# Patient Record
Sex: Male | Born: 1955 | Race: White | Hispanic: No | Marital: Married | Smoking: Current every day smoker
Health system: Southern US, Community
[De-identification: ages and names within clinical notes are randomized; demographics above are authoritative.]

## PROBLEM LIST (undated history)

## (undated) DIAGNOSIS — I10 Essential (primary) hypertension: Secondary | ICD-10-CM

## (undated) DIAGNOSIS — I714 Abdominal aortic aneurysm, without rupture, unspecified: Secondary | ICD-10-CM

## (undated) DIAGNOSIS — K7030 Alcoholic cirrhosis of liver without ascites: Secondary | ICD-10-CM

## (undated) DIAGNOSIS — M549 Dorsalgia, unspecified: Secondary | ICD-10-CM

## (undated) DIAGNOSIS — G8929 Other chronic pain: Secondary | ICD-10-CM

## (undated) DIAGNOSIS — J449 Chronic obstructive pulmonary disease, unspecified: Secondary | ICD-10-CM

## (undated) DIAGNOSIS — K219 Gastro-esophageal reflux disease without esophagitis: Secondary | ICD-10-CM

## (undated) DIAGNOSIS — N179 Acute kidney failure, unspecified: Secondary | ICD-10-CM

## (undated) HISTORY — DX: Gastro-esophageal reflux disease without esophagitis: K21.9

## (undated) HISTORY — DX: Alcoholic cirrhosis of liver without ascites: K70.30

---

## 2015-03-07 ENCOUNTER — Inpatient Hospital Stay (HOSPITAL_COMMUNITY): Payer: Medicaid Other

## 2015-03-07 ENCOUNTER — Emergency Department (HOSPITAL_COMMUNITY): Payer: Medicaid Other

## 2015-03-07 ENCOUNTER — Inpatient Hospital Stay (HOSPITAL_COMMUNITY)
Admission: EM | Admit: 2015-03-07 | Discharge: 2015-03-24 | DRG: 207 | Disposition: A | Discharge status: Home / Self Care | Payer: Medicaid Other | Attending: General Surgery | Admitting: General Surgery

## 2015-03-07 ENCOUNTER — Encounter (HOSPITAL_COMMUNITY): Payer: Self-pay | Admitting: Radiology

## 2015-03-07 DIAGNOSIS — N4889 Other specified disorders of penis: Secondary | ICD-10-CM | POA: Diagnosis present

## 2015-03-07 DIAGNOSIS — S2243XA Multiple fractures of ribs, bilateral, initial encounter for closed fracture: Secondary | ICD-10-CM | POA: Diagnosis present

## 2015-03-07 DIAGNOSIS — S272XXA Traumatic hemopneumothorax, initial encounter: Principal | ICD-10-CM | POA: Diagnosis present

## 2015-03-07 DIAGNOSIS — K7460 Unspecified cirrhosis of liver: Secondary | ICD-10-CM | POA: Diagnosis present

## 2015-03-07 DIAGNOSIS — R000 Tachycardia, unspecified: Secondary | ICD-10-CM | POA: Diagnosis present

## 2015-03-07 DIAGNOSIS — T797XXA Traumatic subcutaneous emphysema, initial encounter: Secondary | ICD-10-CM | POA: Diagnosis present

## 2015-03-07 DIAGNOSIS — R339 Retention of urine, unspecified: Secondary | ICD-10-CM | POA: Diagnosis not present

## 2015-03-07 DIAGNOSIS — R161 Splenomegaly, not elsewhere classified: Secondary | ICD-10-CM | POA: Diagnosis present

## 2015-03-07 DIAGNOSIS — E870 Hyperosmolality and hypernatremia: Secondary | ICD-10-CM | POA: Diagnosis not present

## 2015-03-07 DIAGNOSIS — D62 Acute posthemorrhagic anemia: Secondary | ICD-10-CM | POA: Diagnosis not present

## 2015-03-07 DIAGNOSIS — R079 Chest pain, unspecified: Secondary | ICD-10-CM | POA: Diagnosis present

## 2015-03-07 DIAGNOSIS — N5089 Other specified disorders of the male genital organs: Secondary | ICD-10-CM | POA: Diagnosis present

## 2015-03-07 DIAGNOSIS — J90 Pleural effusion, not elsewhere classified: Secondary | ICD-10-CM | POA: Diagnosis present

## 2015-03-07 DIAGNOSIS — R918 Other nonspecific abnormal finding of lung field: Secondary | ICD-10-CM | POA: Diagnosis present

## 2015-03-07 DIAGNOSIS — R188 Other ascites: Secondary | ICD-10-CM | POA: Diagnosis present

## 2015-03-07 DIAGNOSIS — E279 Disorder of adrenal gland, unspecified: Secondary | ICD-10-CM | POA: Diagnosis present

## 2015-03-07 DIAGNOSIS — S272XXS Traumatic hemopneumothorax, sequela: Secondary | ICD-10-CM | POA: Diagnosis not present

## 2015-03-07 DIAGNOSIS — F1190 Opioid use, unspecified, uncomplicated: Secondary | ICD-10-CM | POA: Diagnosis present

## 2015-03-07 DIAGNOSIS — M549 Dorsalgia, unspecified: Secondary | ICD-10-CM | POA: Diagnosis present

## 2015-03-07 DIAGNOSIS — K766 Portal hypertension: Secondary | ICD-10-CM | POA: Diagnosis present

## 2015-03-07 DIAGNOSIS — W19XXXA Unspecified fall, initial encounter: Secondary | ICD-10-CM | POA: Diagnosis present

## 2015-03-07 DIAGNOSIS — K439 Ventral hernia without obstruction or gangrene: Secondary | ICD-10-CM | POA: Diagnosis present

## 2015-03-07 DIAGNOSIS — J95821 Acute postprocedural respiratory failure: Secondary | ICD-10-CM | POA: Diagnosis not present

## 2015-03-07 DIAGNOSIS — J939 Pneumothorax, unspecified: Secondary | ICD-10-CM

## 2015-03-07 DIAGNOSIS — J440 Chronic obstructive pulmonary disease with acute lower respiratory infection: Secondary | ICD-10-CM | POA: Diagnosis present

## 2015-03-07 DIAGNOSIS — E162 Hypoglycemia, unspecified: Secondary | ICD-10-CM | POA: Diagnosis not present

## 2015-03-07 DIAGNOSIS — L8990 Pressure ulcer of unspecified site, unspecified stage: Secondary | ICD-10-CM | POA: Insufficient documentation

## 2015-03-07 DIAGNOSIS — J982 Interstitial emphysema: Secondary | ICD-10-CM

## 2015-03-07 DIAGNOSIS — E876 Hypokalemia: Secondary | ICD-10-CM | POA: Diagnosis present

## 2015-03-07 DIAGNOSIS — Y93K1 Activity, walking an animal: Secondary | ICD-10-CM

## 2015-03-07 DIAGNOSIS — G8929 Other chronic pain: Secondary | ICD-10-CM | POA: Diagnosis present

## 2015-03-07 DIAGNOSIS — W000XXA Fall on same level due to ice and snow, initial encounter: Secondary | ICD-10-CM | POA: Diagnosis present

## 2015-03-07 DIAGNOSIS — I10 Essential (primary) hypertension: Secondary | ICD-10-CM | POA: Diagnosis present

## 2015-03-07 DIAGNOSIS — J189 Pneumonia, unspecified organism: Secondary | ICD-10-CM | POA: Diagnosis not present

## 2015-03-07 DIAGNOSIS — F17200 Nicotine dependence, unspecified, uncomplicated: Secondary | ICD-10-CM | POA: Diagnosis present

## 2015-03-07 DIAGNOSIS — W19XXXS Unspecified fall, sequela: Secondary | ICD-10-CM | POA: Diagnosis not present

## 2015-03-07 DIAGNOSIS — R160 Hepatomegaly, not elsewhere classified: Secondary | ICD-10-CM | POA: Diagnosis present

## 2015-03-07 DIAGNOSIS — Z4659 Encounter for fitting and adjustment of other gastrointestinal appliance and device: Secondary | ICD-10-CM

## 2015-03-07 DIAGNOSIS — R40243 Glasgow coma scale score 3-8, unspecified time: Secondary | ICD-10-CM | POA: Diagnosis not present

## 2015-03-07 DIAGNOSIS — J9690 Respiratory failure, unspecified, unspecified whether with hypoxia or hypercapnia: Secondary | ICD-10-CM

## 2015-03-07 DIAGNOSIS — Z978 Presence of other specified devices: Secondary | ICD-10-CM

## 2015-03-07 DIAGNOSIS — R0902 Hypoxemia: Secondary | ICD-10-CM

## 2015-03-07 DIAGNOSIS — J9601 Acute respiratory failure with hypoxia: Secondary | ICD-10-CM | POA: Diagnosis not present

## 2015-03-07 DIAGNOSIS — J13 Pneumonia due to Streptococcus pneumoniae: Secondary | ICD-10-CM | POA: Diagnosis not present

## 2015-03-07 DIAGNOSIS — R002 Palpitations: Secondary | ICD-10-CM | POA: Diagnosis present

## 2015-03-07 DIAGNOSIS — D696 Thrombocytopenia, unspecified: Secondary | ICD-10-CM | POA: Diagnosis present

## 2015-03-07 DIAGNOSIS — I491 Atrial premature depolarization: Secondary | ICD-10-CM | POA: Diagnosis not present

## 2015-03-07 DIAGNOSIS — I714 Abdominal aortic aneurysm, without rupture, unspecified: Secondary | ICD-10-CM | POA: Diagnosis present

## 2015-03-07 DIAGNOSIS — S2242XS Multiple fractures of ribs, left side, sequela: Secondary | ICD-10-CM | POA: Diagnosis not present

## 2015-03-07 DIAGNOSIS — G9340 Encephalopathy, unspecified: Secondary | ICD-10-CM | POA: Diagnosis not present

## 2015-03-07 DIAGNOSIS — S2249XS Multiple fractures of ribs, unspecified side, sequela: Secondary | ICD-10-CM | POA: Diagnosis not present

## 2015-03-07 DIAGNOSIS — J9600 Acute respiratory failure, unspecified whether with hypoxia or hypercapnia: Secondary | ICD-10-CM

## 2015-03-07 DIAGNOSIS — S2249XA Multiple fractures of ribs, unspecified side, initial encounter for closed fracture: Secondary | ICD-10-CM | POA: Diagnosis present

## 2015-03-07 DIAGNOSIS — S2242XA Multiple fractures of ribs, left side, initial encounter for closed fracture: Secondary | ICD-10-CM

## 2015-03-07 DIAGNOSIS — R1310 Dysphagia, unspecified: Secondary | ICD-10-CM | POA: Diagnosis present

## 2015-03-07 DIAGNOSIS — E278 Other specified disorders of adrenal gland: Secondary | ICD-10-CM | POA: Diagnosis present

## 2015-03-07 DIAGNOSIS — R0602 Shortness of breath: Secondary | ICD-10-CM

## 2015-03-07 HISTORY — DX: Dorsalgia, unspecified: M54.9

## 2015-03-07 HISTORY — DX: Other chronic pain: G89.29

## 2015-03-07 HISTORY — DX: Essential (primary) hypertension: I10

## 2015-03-07 LAB — RAPID URINE DRUG SCREEN, HOSP PERFORMED
AMPHETAMINES: NOT DETECTED
BENZODIAZEPINES: NOT DETECTED
Barbiturates: NOT DETECTED
Cocaine: NOT DETECTED
Opiates: POSITIVE — AB
TETRAHYDROCANNABINOL: NOT DETECTED

## 2015-03-07 LAB — COMPREHENSIVE METABOLIC PANEL
ALBUMIN: 2.4 g/dL — AB (ref 3.5–5.0)
ALT: 30 U/L (ref 17–63)
ALT: 28 U/L (ref 17–63)
AST: 69 U/L — AB (ref 15–41)
AST: 67 U/L — AB (ref 15–41)
Albumin: 2.2 g/dL — ABNORMAL LOW (ref 3.5–5.0)
Alkaline Phosphatase: 163 U/L — ABNORMAL HIGH (ref 38–126)
Alkaline Phosphatase: 156 U/L — ABNORMAL HIGH (ref 38–126)
Anion gap: 9 (ref 5–15)
Anion gap: 8 (ref 5–15)
BUN: 14 mg/dL (ref 6–20)
BUN: 12 mg/dL (ref 6–20)
CHLORIDE: 105 mmol/L (ref 101–111)
CO2: 27 mmol/L (ref 22–32)
CO2: 23 mmol/L (ref 22–32)
CREATININE: 1.03 mg/dL (ref 0.61–1.24)
Calcium: 8.1 mg/dL — ABNORMAL LOW (ref 8.9–10.3)
Calcium: 7.6 mg/dL — ABNORMAL LOW (ref 8.9–10.3)
Chloride: 102 mmol/L (ref 101–111)
Creatinine, Ser: 1.11 mg/dL (ref 0.61–1.24)
GFR calc Af Amer: >60 mL/min (ref >60)
GFR calc Af Amer: >60 mL/min (ref >60)
GFR calc non Af Amer: >60 mL/min (ref >60)
GFR, EST NON AFRICAN AMERICAN: 54 mL/min — AB (ref >60)
GLUCOSE: 138 mg/dL — AB (ref 65–99)
GLUCOSE: 117 mg/dL — AB (ref 65–99)
POTASSIUM: 3.3 mmol/L — AB (ref 3.5–5.1)
POTASSIUM: 4 mmol/L (ref 3.5–5.1)
SODIUM: 138 mmol/L (ref 135–145)
Sodium: 136 mmol/L (ref 135–145)
TOTAL PROTEIN: 6.4 g/dL — AB (ref 6.5–8.1)
Total Bilirubin: 0.7 mg/dL (ref 0.3–1.2)
Total Bilirubin: 0.8 mg/dL (ref 0.3–1.2)
Total Protein: 6.2 g/dL — ABNORMAL LOW (ref 6.5–8.1)

## 2015-03-07 LAB — CBC
HEMATOCRIT: 37.4 % — AB (ref 39.0–52.0)
Hemoglobin: 12.4 g/dL — ABNORMAL LOW (ref 13.0–17.0)
MCH: 30.8 pg (ref 26.0–34.0)
MCHC: 33.2 g/dL (ref 30.0–36.0)
MCV: 93 fL (ref 78.0–100.0)
PLATELETS: 119 10*3/uL — AB (ref 150–400)
RBC: 4.02 MIL/uL — AB (ref 4.22–5.81)
RDW: 16 % — ABNORMAL HIGH (ref 11.5–15.5)
WBC: 5.5 10*3/uL (ref 4.0–10.5)

## 2015-03-07 LAB — URINALYSIS, ROUTINE W REFLEX MICROSCOPIC
Bilirubin Urine: NEGATIVE
Glucose, UA: NEGATIVE mg/dL
HGB URINE DIPSTICK: NEGATIVE
Ketones, ur: NEGATIVE mg/dL
LEUKOCYTES UA: NEGATIVE
Nitrite: NEGATIVE
PH: 5.5 (ref 5.0–8.0)
Protein, ur: NEGATIVE mg/dL
SPECIFIC GRAVITY, URINE: 1.042 — AB (ref 1.005–1.030)

## 2015-03-07 LAB — CBC WITH DIFFERENTIAL/PLATELET
BASOS ABS: 0 10*3/uL (ref 0.0–0.1)
BASOS PCT: 0 %
EOS ABS: 0.3 10*3/uL (ref 0.0–0.7)
EOS PCT: 4 %
HCT: 38.9 % — ABNORMAL LOW (ref 39.0–52.0)
Hemoglobin: 12.7 g/dL — ABNORMAL LOW (ref 13.0–17.0)
LYMPHS PCT: 43 %
Lymphs Abs: 3.1 10*3/uL (ref 0.7–4.0)
MCH: 30.6 pg (ref 26.0–34.0)
MCHC: 32.6 g/dL (ref 30.0–36.0)
MCV: 93.7 fL (ref 78.0–100.0)
MONO ABS: 0.7 10*3/uL (ref 0.1–1.0)
Monocytes Relative: 9 %
Neutro Abs: 3.2 10*3/uL (ref 1.7–7.7)
Neutrophils Relative %: 44 %
PLATELETS: 136 10*3/uL — AB (ref 150–400)
RBC: 4.15 MIL/uL — AB (ref 4.22–5.81)
RDW: 15.9 % — AB (ref 11.5–15.5)
WBC: 7.3 10*3/uL (ref 4.0–10.5)

## 2015-03-07 LAB — PREPARE FRESH FROZEN PLASMA
UNIT DIVISION: 0
UNIT DIVISION: 0

## 2015-03-07 LAB — ABO/RH: ABO/RH(D): O POS

## 2015-03-07 LAB — I-STAT CHEM 8, ED
BUN: 16 mg/dL (ref 6–20)
CHLORIDE: 100 mmol/L — AB (ref 101–111)
CREATININE: 1 mg/dL (ref 0.61–1.24)
Calcium, Ion: 1.08 mmol/L — ABNORMAL LOW (ref 1.12–1.23)
GLUCOSE: 131 mg/dL — AB (ref 65–99)
HCT: 43 % (ref 39.0–52.0)
Hemoglobin: 14.6 g/dL (ref 13.0–17.0)
POTASSIUM: 3.2 mmol/L — AB (ref 3.5–5.1)
Sodium: 141 mmol/L (ref 135–145)
TCO2: 25 mmol/L (ref 0–100)

## 2015-03-07 LAB — ETHANOL

## 2015-03-07 LAB — BLOOD PRODUCT ORDER (VERBAL) VERIFICATION

## 2015-03-07 LAB — I-STAT CG4 LACTIC ACID, ED: LACTIC ACID, VENOUS: 2.06 mmol/L — AB (ref 0.5–2.0)

## 2015-03-07 LAB — MRSA PCR SCREENING: MRSA by PCR: POSITIVE — AB

## 2015-03-07 LAB — LIPASE, BLOOD: Lipase: 39 U/L (ref 11–51)

## 2015-03-07 LAB — I-STAT TROPONIN, ED: TROPONIN I, POC: 0 ng/mL (ref 0.00–0.08)

## 2015-03-07 MED ORDER — NICOTINE 21 MG/24HR TD PT24
21.0000 mg | MEDICATED_PATCH | Freq: Every day | TRANSDERMAL | Status: DC
  Administered 2015-03-07 – 2015-03-11 (×5): 21 mg via TRANSDERMAL
  Filled 2015-03-07 (×5): qty 1

## 2015-03-07 MED ORDER — KETOROLAC TROMETHAMINE 15 MG/ML IJ SOLN
15.0000 mg | Freq: Four times a day (QID) | INTRAMUSCULAR | Status: DC | PRN
  Administered 2015-03-07 – 2015-03-09 (×5): 15 mg via INTRAVENOUS
  Filled 2015-03-07 (×5): qty 1

## 2015-03-07 MED ORDER — HYDROMORPHONE HCL 1 MG/ML IJ SOLN
1.0000 mg | Freq: Once | INTRAMUSCULAR | Status: AC
  Administered 2015-03-07: 1 mg via INTRAVENOUS

## 2015-03-07 MED ORDER — ALBUTEROL SULFATE (2.5 MG/3ML) 0.083% IN NEBU
2.5000 mg | INHALATION_SOLUTION | Freq: Four times a day (QID) | RESPIRATORY_TRACT | Status: DC
  Administered 2015-03-07 – 2015-03-10 (×12): 2.5 mg via RESPIRATORY_TRACT
  Filled 2015-03-07 (×13): qty 3

## 2015-03-07 MED ORDER — SODIUM CHLORIDE 0.9 % IV BOLUS (SEPSIS)
1000.0000 mL | Freq: Once | INTRAVENOUS | Status: AC
  Administered 2015-03-07: 1000 mL via INTRAVENOUS

## 2015-03-07 MED ORDER — ENOXAPARIN SODIUM 40 MG/0.4ML ~~LOC~~ SOLN
40.0000 mg | SUBCUTANEOUS | Status: DC
  Administered 2015-03-07 – 2015-03-11 (×5): 40 mg via SUBCUTANEOUS
  Filled 2015-03-07 (×5): qty 0.4

## 2015-03-07 MED ORDER — AMLODIPINE BESYLATE 10 MG PO TABS
10.0000 mg | ORAL_TABLET | Freq: Every day | ORAL | Status: DC
  Administered 2015-03-07 – 2015-03-09 (×3): 10 mg via ORAL
  Filled 2015-03-07 (×3): qty 1

## 2015-03-07 MED ORDER — ONDANSETRON HCL 4 MG/2ML IJ SOLN
4.0000 mg | Freq: Four times a day (QID) | INTRAMUSCULAR | Status: DC | PRN
  Administered 2015-03-07 – 2015-03-18 (×2): 4 mg via INTRAVENOUS
  Filled 2015-03-07 (×2): qty 2

## 2015-03-07 MED ORDER — IOHEXOL 300 MG/ML  SOLN
100.0000 mL | Freq: Once | INTRAMUSCULAR | Status: AC | PRN
  Administered 2015-03-07: 100 mL via INTRAVENOUS

## 2015-03-07 MED ORDER — PNEUMOCOCCAL VAC POLYVALENT 25 MCG/0.5ML IJ INJ
0.5000 mL | INJECTION | INTRAMUSCULAR | Status: AC
  Administered 2015-03-08: 0.5 mL via INTRAMUSCULAR
  Filled 2015-03-07: qty 0.5

## 2015-03-07 MED ORDER — HYDROMORPHONE HCL 1 MG/ML IJ SOLN
INTRAMUSCULAR | Status: AC
  Filled 2015-03-07: qty 1

## 2015-03-07 MED ORDER — POTASSIUM CHLORIDE 20 MEQ PO PACK
20.0000 meq | PACK | Freq: Every day | ORAL | Status: DC
  Administered 2015-03-07 – 2015-03-08 (×2): 20 meq via ORAL
  Filled 2015-03-07 (×3): qty 1

## 2015-03-07 MED ORDER — HYDROMORPHONE HCL 1 MG/ML IJ SOLN
1.0000 mg | INTRAMUSCULAR | Status: DC | PRN
  Administered 2015-03-07 – 2015-03-09 (×17): 1 mg via INTRAVENOUS
  Filled 2015-03-07 (×17): qty 1

## 2015-03-07 MED ORDER — ONDANSETRON HCL 4 MG PO TABS: 4.0000 mg | ORAL_TABLET | Freq: Four times a day (QID) | ORAL | Status: DC | PRN

## 2015-03-07 MED ORDER — BISACODYL 10 MG RE SUPP: 10.0000 mg | Freq: Every day | RECTAL | Status: DC | PRN

## 2015-03-07 MED ORDER — CETYLPYRIDINIUM CHLORIDE 0.05 % MT LIQD
7.0000 mL | Freq: Two times a day (BID) | OROMUCOSAL | Status: DC
  Administered 2015-03-07 – 2015-03-09 (×4): 7 mL via OROMUCOSAL

## 2015-03-07 MED ORDER — CHLORHEXIDINE GLUCONATE CLOTH 2 % EX PADS
6.0000 | MEDICATED_PAD | Freq: Every day | CUTANEOUS | Status: AC
  Administered 2015-03-07 – 2015-03-11 (×5): 6 via TOPICAL

## 2015-03-07 MED ORDER — SENNOSIDES-DOCUSATE SODIUM 8.6-50 MG PO TABS
1.0000 | ORAL_TABLET | Freq: Two times a day (BID) | ORAL | Status: DC
  Administered 2015-03-07 – 2015-03-24 (×27): 1 via ORAL
  Filled 2015-03-07 (×28): qty 1

## 2015-03-07 MED ORDER — KCL IN DEXTROSE-NACL 20-5-0.9 MEQ/L-%-% IV SOLN
INTRAVENOUS | Status: DC
  Administered 2015-03-07 (×2): via INTRAVENOUS
  Administered 2015-03-09: 1000 mL via INTRAVENOUS
  Filled 2015-03-07 (×5): qty 1000

## 2015-03-07 MED ORDER — INFLUENZA VAC SPLIT QUAD 0.5 ML IM SUSY
0.5000 mL | PREFILLED_SYRINGE | INTRAMUSCULAR | Status: AC
  Administered 2015-03-08: 0.5 mL via INTRAMUSCULAR

## 2015-03-07 MED ORDER — MUPIROCIN 2 % EX OINT
1.0000 "application " | TOPICAL_OINTMENT | Freq: Two times a day (BID) | CUTANEOUS | Status: AC
  Administered 2015-03-07 – 2015-03-11 (×10): 1 via NASAL
  Filled 2015-03-07 (×4): qty 22

## 2015-03-07 MED ORDER — METHADONE HCL 10 MG/5ML PO SOLN: 71.0000 mg | Freq: Every day | ORAL | Status: DC

## 2015-03-07 MED ORDER — METHADONE HCL 10 MG PO TABS
70.0000 mg | ORAL_TABLET | Freq: Every day | ORAL | Status: DC
  Administered 2015-03-07 – 2015-03-09 (×3): 70 mg via ORAL
  Filled 2015-03-07 (×3): qty 7

## 2015-03-07 MED ORDER — HYDROMORPHONE HCL 1 MG/ML IJ SOLN
1.0000 mg | Freq: Once | INTRAMUSCULAR | Status: AC
  Administered 2015-03-07: 1 mg via INTRAVENOUS
  Filled 2015-03-07: qty 1

## 2015-03-07 MED ORDER — POTASSIUM CHLORIDE 10 MEQ/100ML IV SOLN
10.0000 meq | INTRAVENOUS | Status: AC
  Administered 2015-03-07 (×2): 10 meq via INTRAVENOUS
  Filled 2015-03-07 (×2): qty 100

## 2015-03-07 MED ORDER — OXYCODONE HCL 5 MG PO TABS
10.0000 mg | ORAL_TABLET | ORAL | Status: DC | PRN
  Administered 2015-03-07 – 2015-03-08 (×7): 10 mg via ORAL
  Filled 2015-03-07 (×7): qty 2

## 2015-03-07 NOTE — Progress Notes (Signed)
Dr. Donell BeersByerly informed of chest x-ray results. Stated to proceed with Bi-pap at this time, and no chest tube placement. Patient's oxygen saturation 91-94% on Venti mask at this time.

## 2015-03-07 NOTE — ED Notes (Signed)
The pt arrived by gems from home the pt went out to walk his dog slipped on the ice on the ground and fell to the pavement  Striking his lt chest on the pavement.  C/o much pain in his lt chest and his neck  c-collar.

## 2015-03-07 NOTE — ED Notes (Signed)
Clothes shirt sweat shirt jacket pants shoes socks removed bagged up.  No valuables

## 2015-03-07 NOTE — ED Notes (Signed)
To xray then to 2658m

## 2015-03-07 NOTE — ED Notes (Signed)
To ct

## 2015-03-07 NOTE — ED Notes (Signed)
Report called to rn on 3261m.  Pt need flesion and extension of his c-spine will take him up after this

## 2015-03-07 NOTE — ED Notes (Signed)
Pt returned from ct

## 2015-03-07 NOTE — ED Notes (Signed)
Nasal o02 at 3 liters

## 2015-03-07 NOTE — Progress Notes (Signed)
Follow up - Trauma and Critical Care  Patient Details:    Benjamin Bell is an 60 y.o. male.  Lines/tubes :   Microbiology/Sepsis markers: No results found for this or any previous visit.  Anti-infectives:  Anti-infectives    None      Best Practice/Protocols:  VTE Prophylaxis: Lovenox (prophylaxtic dose) none.  Consults:      Events:  Subjective:    Overnight Issues: Transferred up to icu  Objective:  Vital signs for last 24 hours: Temp:  [97.4 F (36.3 C)-98.3 F (36.8 C)] 98.3 F (36.8 C) (01/08 0737) Pulse Rate:  [86-111] 111 (01/08 0800) Resp:  [16-24] 16 (01/08 0800) BP: (84-131)/(57-79) 120/77 mmHg (01/08 0800) SpO2:  [88 %-94 %] 93 % (01/08 0800) Weight:  [68.04 kg (150 lb)] 68.04 kg (150 lb) (01/08 0446)  Hemodynamic parameters for last 24 hours:    Intake/Output from previous day: 01/07 0701 - 01/08 0700 In: 1900 [I.V.:1900] Out: 0   Intake/Output this shift: Total I/O In: 118.8 [I.V.:118.8] Out: 390 [Urine:390]  Vent settings for last 24 hours:    Physical Exam:  General: alert and no respiratory distress Neuro: alert, oriented and nonfocal exam Resp: breathing comfortably CVS: RR&R GI: s, NT, ND Abd with reducible hernias and incisions.  Results for orders placed or performed during the hospital encounter of 03/07/15 (from the past 24 hour(s))  Type and screen     Status: None   Collection Time: 03/07/15  3:17 AM  Result Value Ref Range   ABO/RH(D) O POS    Antibody Screen NEG    Sample Expiration 03/10/2015    Unit Number [id number]    Blood Component Type RED CELLS,LR    Unit division 00    Status of Unit REL FROM [facility]    Unit tag comment VERBAL ORDERS PER DR ONI    Transfusion Status OK TO TRANSFUSE    Crossmatch Result COMPATIBLE    Unit Number [id number]    Blood Component Type RED CELLS,LR    Unit division 00    Status of Unit REL FROM [facility]    Unit tag comment VERBAL ORDERS PER DR ONI    Transfusion Status  OK TO TRANSFUSE    Crossmatch Result COMPATIBLE   Prepare fresh frozen plasma     Status: None   Collection Time: 03/07/15  3:17 AM  Result Value Ref Range   Unit Number [id number]    Blood Component Type THAWED PLASMA    Unit division 00    Status of Unit REL FROM [facility]    Unit tag comment VERBAL ORDERS PER DR ONI    Transfusion Status OK TO TRANSFUSE    Unit Number [id number]    Blood Component Type THAWED PLASMA    Unit division 00    Status of Unit REL FROM [facility]    Unit tag comment VERBAL ORDERS PER DR ONI    Transfusion Status OK TO TRANSFUSE   ABO/Rh     Status: None   Collection Time: 03/07/15  3:17 AM  Result Value Ref Range   ABO/RH(D) O POS   CBC with Differential/Platelet     Status: Abnormal   Collection Time: 03/07/15  3:21 AM  Result Value Ref Range   WBC 7.3 4.0 - 10.5 K/uL   RBC 4.15 (L) 4.22 - 5.81 MIL/uL   Hemoglobin 12.7 (L) 13.0 - 17.0 g/dL   HCT 72.5 (L) 36.6 - 44.0 %   MCV 93.7 78.0 - 100.0 fL  MCH 30.6 26.0 - 34.0 pg   MCHC 32.6 30.0 - 36.0 g/dL   RDW 16.115.9 (H) 09.611.5 - 04.515.5 %   Platelets 136 (L) 150 - 400 K/uL   Neutrophils Relative % 44 %   Neutro Abs 3.2 1.7 - 7.7 K/uL   Lymphocytes Relative 43 %   Lymphs Abs 3.1 0.7 - 4.0 K/uL   Monocytes Relative 9 %   Monocytes Absolute 0.7 0.1 - 1.0 K/uL   Eosinophils Relative 4 %   Eosinophils Absolute 0.3 0.0 - 0.7 K/uL   Basophils Relative 0 %   Basophils Absolute 0.0 0.0 - 0.1 K/uL  Comprehensive metabolic panel     Status: Abnormal   Collection Time: 03/07/15  3:21 AM  Result Value Ref Range   Sodium 138 135 - 145 mmol/L   Potassium 3.3 (L) 3.5 - 5.1 mmol/L   Chloride 102 101 - 111 mmol/L   CO2 27 22 - 32 mmol/L   Glucose, Bld 138 (H) 65 - 99 mg/dL   BUN 14 6 - 20 mg/dL   Creatinine, Ser 4.091.11 0.61 - 1.24 mg/dL   Calcium 8.1 (L) 8.9 - 10.3 mg/dL   Total Protein 6.4 (L) 6.5 - 8.1 g/dL   Albumin 2.4 (L) 3.5 - 5.0 g/dL   AST 69 (H) 15 - 41 U/L   ALT 30 17 - 63 U/L   Alkaline Phosphatase 163  (H) 38 - 126 U/L   Total Bilirubin 0.7 0.3 - 1.2 mg/dL   GFR calc non Af Amer 54 (L) >60 mL/min   GFR calc Af Amer >60 >60 mL/min   Anion gap 9 5 - 15  Lipase, blood     Status: None   Collection Time: 03/07/15  3:21 AM  Result Value Ref Range   Lipase 39 11 - 51 U/L  Ethanol     Status: None   Collection Time: 03/07/15  3:21 AM  Result Value Ref Range   Alcohol, Ethyl (B) <5 <5 mg/dL  I-stat troponin, ED     Status: None   Collection Time: 03/07/15  3:26 AM  Result Value Ref Range   Troponin i, poc 0.00 0.00 - 0.08 ng/mL   Comment 3          I-Stat CG4 Lactic Acid, ED     Status: Abnormal   Collection Time: 03/07/15  3:28 AM  Result Value Ref Range   Lactic Acid, Venous 2.06 (HH) 0.5 - 2.0 mmol/L   Comment NOTIFIED PHYSICIAN   I-stat chem 8, ed     Status: Abnormal   Collection Time: 03/07/15  3:29 AM  Result Value Ref Range   Sodium 141 135 - 145 mmol/L   Potassium 3.2 (L) 3.5 - 5.1 mmol/L   Chloride 100 (L) 101 - 111 mmol/L   BUN 16 6 - 20 mg/dL   Creatinine, Ser 8.111.00 0.61 - 1.24 mg/dL   Glucose, Bld 914131 (H) 65 - 99 mg/dL   Calcium, Ion 7.821.08 (L) 1.12 - 1.23 mmol/L   TCO2 25 0 - 100 mmol/L   Hemoglobin 14.6 13.0 - 17.0 g/dL   HCT 95.643.0 21.339.0 - 08.652.0 %  CBC     Status: Abnormal   Collection Time: 03/07/15  5:16 AM  Result Value Ref Range   WBC 5.5 4.0 - 10.5 K/uL   RBC 4.02 (L) 4.22 - 5.81 MIL/uL   Hemoglobin 12.4 (L) 13.0 - 17.0 g/dL   HCT 57.837.4 (L) 46.939.0 - 62.952.0 %   MCV  93.0 78.0 - 100.0 fL   MCH 30.8 26.0 - 34.0 pg   MCHC 33.2 30.0 - 36.0 g/dL   RDW 02.7 (H) 25.3 - 66.4 %   Platelets 119 (L) 150 - 400 K/uL  Comprehensive metabolic panel     Status: Abnormal   Collection Time: 03/07/15  5:16 AM  Result Value Ref Range   Sodium 136 135 - 145 mmol/L   Potassium 4.0 3.5 - 5.1 mmol/L   Chloride 105 101 - 111 mmol/L   CO2 23 22 - 32 mmol/L   Glucose, Bld 117 (H) 65 - 99 mg/dL   BUN 12 6 - 20 mg/dL   Creatinine, Ser 4.03 0.61 - 1.24 mg/dL   Calcium 7.6 (L) 8.9 - 10.3  mg/dL   Total Protein 6.2 (L) 6.5 - 8.1 g/dL   Albumin 2.2 (L) 3.5 - 5.0 g/dL   AST 67 (H) 15 - 41 U/L   ALT 28 17 - 63 U/L   Alkaline Phosphatase 156 (H) 38 - 126 U/L   Total Bilirubin 0.8 0.3 - 1.2 mg/dL   GFR calc non Af Amer >60 >60 mL/min   GFR calc Af Amer >60 >60 mL/min   Anion gap 8 5 - 15     Assessment/Plan:   NEURO  history of pain issues, on methadone.   Plan: Will need home methadone dose in addition to PRN meds.  PULM  Chest Wall Trauma rib fractures and Pneumothorax (traumatic)   Plan: No significant PTX on recent CXR.  Appeared to be splinting significantly.  Will see how sats do with better pain control.  Pulmonary toilet.    CARDIO  Mild tachycardia.  HTN.     Plan: treat pain.  Add back norvasc.    RENAL  no issues.   Plan: continue IV fluid support  GI  no GI issues yet.  high risk for ileus given injuries and pain medication requirement.  Start bowel regimen.   Plan: Bowel regimen.  Clears.  Increase as tolerated.    ID  no issues   Plan: pulmonary toilet to decrease risk of pneumonia.    HEME  mild ABL anemia   Plan: do treatment needed.  ENDO no issues at this point   Plan: continue current plan of care.    Global Issues      LOS: 0 days   Additional comments:I reviewed the patient's new clinical lab test results. see above and I reviewed the patients new imaging test results. pictures.  report not yet available.  Critical Care Total Time*: 30 Minutes  Genessa Beman 03/07/2015  *Care during the described time interval was provided by me and/or other providers on the critical care team.  I have reviewed this patient's available data, including medical history, events of note, physical examination and test results as part of my evaluation.

## 2015-03-07 NOTE — Progress Notes (Signed)
CH moved pt wife to front waiting area.

## 2015-03-07 NOTE — ED Notes (Signed)
Pt c/o the kcl drip burning his rt arm  kcl drip slowed

## 2015-03-07 NOTE — ED Provider Notes (Signed)
CSN: [id number]     Arrival date & time 03/07/15  0306 History   First MD Initiated Contact with Patient 03/07/15 [phone]     No chief complaint on file.    (Consider location/radiation/quality/duration/timing/severity/associated sxs/prior Treatment) HPI  Benjamin Bell is a 60 y.o. male with no significant past medical history presenting today with chest pain. Patient states he fell and slipped denies while walking his dog. He developed left-sided chest pain and also complains of pain in his neck. He was transferred by EMS. History is obtained by EMS due to the acuity of his condition. Patient denies pain elsewhere. There are no further complaints.  10 Systems reviewed and are negative for acute change except as noted in the HPI.      History reviewed. No pertinent past medical history. No past surgical history on file. No family history on file. Social History  Substance Use Topics  . Smoking status: None  . Smokeless tobacco: None  . Alcohol Use: None   OB History    No data available     Review of Systems    Allergies  Review of patient's allergies indicates not on file.  Home Medications   Prior to Admission medications   Not on File   BP 114/65 mmHg  Pulse 86  Temp(Src) 97.4 F (36.3 C) (Axillary)  Resp 22  SpO2 93% Physical Exam  Constitutional: She is oriented to person, place, and time. She appears well-developed and well-nourished. She appears distressed.  HENT:  Head: Normocephalic and atraumatic.  Nose: Nose normal.  Mouth/Throat: Oropharynx is clear and moist. No oropharyngeal exudate.  Eyes: Conjunctivae and EOM are normal. Pupils are equal, round, and reactive to light. No scleral icterus.  Neck: Normal range of motion. Neck supple. No JVD present. No tracheal deviation present. No thyromegaly present.  C-collar in place, no midline spinal tenderness.  Cardiovascular: Normal rate, regular rhythm and normal heart sounds.  Exam reveals no gallop and no  friction rub.   No murmur heard. Pulmonary/Chest: Effort normal and breath sounds normal. No respiratory distress. She has no wheezes. She exhibits tenderness.  Abdominal: Soft. Bowel sounds are normal. She exhibits no distension and no mass. There is no tenderness. There is no rebound and no guarding.  Musculoskeletal: Normal range of motion. She exhibits no edema or tenderness.  Lymphadenopathy:    She has no cervical adenopathy.  Neurological: She is alert and oriented to person, place, and time. No cranial nerve deficit. She exhibits normal muscle tone.  Normal strength in all 4 extremities, normal sensation.  Skin: Skin is warm and dry. No rash noted. No erythema. No pallor.  There is crepitus to the left posterior chest.   Nursing note and vitals reviewed.   ED Course  Procedures (including critical care time) Labs Review Labs Reviewed  CBC WITH DIFFERENTIAL/PLATELET - Abnormal; Notable for the following:    RBC 4.15 (*)    Hemoglobin 12.7 (*)    HCT 38.9 (*)    RDW 15.9 (*)    Platelets 136 (*)    All other components within normal limits  COMPREHENSIVE METABOLIC PANEL - Abnormal; Notable for the following:    Potassium 3.3 (*)    Glucose, Bld 138 (*)    Calcium 8.1 (*)    Total Protein 6.4 (*)    Albumin 2.4 (*)    AST 69 (*)    Alkaline Phosphatase 163 (*)    GFR calc non Af Amer 54 (*)  All other components within normal limits  I-STAT CG4 LACTIC ACID, ED - Abnormal; Notable for the following:    Lactic Acid, Venous 2.06 (*)    All other components within normal limits  I-STAT CHEM 8, ED - Abnormal; Notable for the following:    Potassium 3.2 (*)    Chloride 100 (*)    Glucose, Bld 131 (*)    Calcium, Ion 1.08 (*)    All other components within normal limits  LIPASE, BLOOD  ETHANOL  URINALYSIS, ROUTINE W REFLEX MICROSCOPIC (NOT AT [facility])  URINE RAPID DRUG SCREEN, HOSP PERFORMED  I-STAT TROPOININ, ED  TYPE AND SCREEN  PREPARE FRESH FROZEN PLASMA  ABO/RH     Imaging Review Dg Chest Port 1 View  03/07/2015  CLINICAL DATA:  Status post fall, with chest deformity. Level 1 trauma. Initial encounter. EXAM: PORTABLE CHEST 1 VIEW COMPARISON:  None. FINDINGS: The lungs are well-aerated. A trace left-sided pneumothorax is suggested. Multiple left-sided rib fractures are seen, with overlying soft tissue air. Left basilar opacity may reflect atelectasis or pulmonary parenchymal contusion. Underlying vascular congestion is noted. No definite pleural effusion is seen. The cardiomediastinal silhouette is mildly enlarged. No acute osseous abnormalities are identified. IMPRESSION: 1. Suggestion of trace left-sided pneumothorax. 2. Multiple left-sided rib fractures, with overlying soft tissue air. 3. Left basilar airspace opacity may reflect atelectasis or pulmonary parenchymal contusion. 4. Underlying vascular congestion and mild cardiomegaly. These results were called by telephone at the time of interpretation on 03/07/2015 at 3:28 am to Dr. Tomasita Crumble, who verbally acknowledged these results. Electronically Signed   By: Roanna Raider M.D.   On: 03/07/2015 03:29   I have personally reviewed and evaluated these images and lab results as part of my medical decision-making.   EKG Interpretation   Date/Time:  Sunday March 07 2015 03:13:46 EST Ventricular Rate:  87 PR Interval:  168 QRS Duration: 92 QT Interval:  480 QTC Calculation: 577 R Axis:   60 Text Interpretation:  Sinus rhythm Anteroseptal infarct, age indeterminate  Prolonged QT interval No old tracing to compare Confirmed by Erroll Luna [phone]) on 03/07/2015 3:27:43 AM      MDM   Final diagnoses:  None   patient presents emergency department for a fall with acute trauma. Initial blood pressure per EMS was 80s over 40s, oxygen saturation was in the 80s on room air. He is now on nasal cannula. History is consistent with pneumothorax. A chest x-ray confirms small left-sided pneumothorax with  soft tissue emphysema. Will obtain CT scan of head and neck chest abdomen and pelvis for further evaluation. Patient given IV fluids and Dilaudid for pain control. Dr. Luisa Hart with trauma surgery is at the bedside evaluating the patient upon arrival.  K+ is 3.2 and was replaced with .  CT reveals mult R rib fractures, hemo/pneumothorax, pneumomediastinum, and pulm contusion.  He has new O2 requirement as well.  Dr. Luisa Hart will admit for further care and likely place a chest tube.  CRITICAL CARE Performed by: Tomasita Crumble   Total critical care time:35 minutes - hemopneumothorax with pneumomediastinum  Critical care time was exclusive of separately billable procedures and treating other patients.  Critical care was necessary to treat or prevent imminent or life-threatening deterioration.  Critical care was time spent personally by me on the following activities: development of treatment plan with patient and/or surrogate as well as nursing, discussions with consultants, evaluation of patient's response to treatment, examination of patient, obtaining history from patient or  surrogate, ordering and performing treatments and interventions, ordering and review of laboratory studies, ordering and review of radiographic studies, pulse oximetry and re-evaluation of patient's condition.   Tomasita CrumbleAdeleke Shawnn Bouillon, MD 03/07/15 [phone]

## 2015-03-07 NOTE — ED Notes (Signed)
2nd run of kcl added to run at 75 mg/hr.  Painful  Infusion to the pt

## 2015-03-07 NOTE — Progress Notes (Signed)
Placed patient on Bipap.

## 2015-03-07 NOTE — Progress Notes (Signed)
Increased patients oxygen to 55% venturi mask due to decrease in Sp02 levels.

## 2015-03-07 NOTE — ED Notes (Signed)
C/o more pain.  kcl added to rt iv.  The pts wife took the cut-off clothes shoes and socks home with her.  She is leaving to return soon

## 2015-03-07 NOTE — ED Notes (Signed)
The pt was found sitting on the ground outside  C/o neck and back pain lt chest pain initial bp was 80/50.  300 ml bolus nss brought bp up to 104/64

## 2015-03-07 NOTE — ED Notes (Signed)
Dr Luisa Hartcornett at the bedside.  pts wife at the bedside

## 2015-03-07 NOTE — Progress Notes (Signed)
Dr. Donell BeersByerly paged and notified that patient's oxygen saturation dropped to 79% on 5 liters via nasal canula. Patient continues to run 87-90% on 5 liters via nasal canula. Patient now on venti mask, O2 saturation 90-93%.

## 2015-03-07 NOTE — Progress Notes (Signed)
Upon walking in the patients room he removed his aspen collar and [place]. O2 sats were in the 70s, had pt put [place] back on and use IS reaching 1200 and pt O2 sats returned to mid 90s. Pt and wife educated on importance of collar as well as wear [place]. Wife and pt stated he didn't want to wear the collar and it was interfering with eating lunch and he was no longer in pain with neck rotation. Dr Donell BeersByerly notified of pts refusal to wear collar, she stated to take him down for flex/ex xray to ensure safe d/c of collar. Pt and wife informed of plan and agreeable to plan. Will continue to monitor.

## 2015-03-07 NOTE — ED Notes (Signed)
c-collar remains in place

## 2015-03-07 NOTE — H&P (Signed)
Benjamin Bell is an 60 y.o. male.   Chief Complaint: Fall with left-sided chest pain HPI: Patient presents as a level I trauma activation after falling at 3:00 this morning while his dog. Complains of left-sided chest pain. His blood pressure was in the mid 80s upon arrival but normalized the small fluid challenge. He is now hemodynamically stable. He is a heavy smoker and has significant COPD. He complains of severe left-sided chest pain. Denies abdominal pain. Complains of neck pain. He is in a hard collar.  History reviewed. No pertinent past medical history.  History reviewed. No pertinent past surgical history.  No family history on file. Social History:  reports that he has been smoking.  He does not have any smokeless tobacco history on file. He reports that he does not drink alcohol. His drug history is not on file.  Allergies: No Known Allergies   (Not in a hospital admission)  Results for orders placed or performed during the hospital encounter of 03/07/15 (from the past 48 hour(s))  Type and screen     Status: None   Collection Time: 03/07/15  3:17 AM  Result Value Ref Range   ABO/RH(D) O POS    Antibody Screen NEG    Sample Expiration 03/10/2015    Unit Number [id number]    Blood Component Type RED CELLS,LR    Unit division 00    Status of Unit REL FROM [facility]    Unit tag comment VERBAL ORDERS PER DR ONI    Transfusion Status OK TO TRANSFUSE    Crossmatch Result COMPATIBLE    Unit Number [id number]    Blood Component Type RED CELLS,LR    Unit division 00    Status of Unit REL FROM [facility]    Unit tag comment VERBAL ORDERS PER DR ONI    Transfusion Status OK TO TRANSFUSE    Crossmatch Result COMPATIBLE   Prepare fresh frozen plasma     Status: None   Collection Time: 03/07/15  3:17 AM  Result Value Ref Range   Unit Number [id number]    Blood Component Type THAWED PLASMA    Unit division 00    Status of Unit REL FROM [facility]    Unit tag comment VERBAL ORDERS  PER DR ONI    Transfusion Status OK TO TRANSFUSE    Unit Number [id number]    Blood Component Type THAWED PLASMA    Unit division 00    Status of Unit REL FROM [facility]    Unit tag comment VERBAL ORDERS PER DR ONI    Transfusion Status OK TO TRANSFUSE   ABO/Rh     Status: None (Preliminary result)   Collection Time: 03/07/15  3:17 AM  Result Value Ref Range   ABO/RH(D) O POS   CBC with Differential/Platelet     Status: Abnormal   Collection Time: 03/07/15  3:21 AM  Result Value Ref Range   WBC 7.3 4.0 - 10.5 K/uL   RBC 4.15 (L) 4.22 - 5.81 MIL/uL    Comment: QA FLAGS AND/OR RANGES MODIFIED BY DEMOGRAPHIC UPDATE ON 01/08 AT 0406   Hemoglobin 12.7 (L) 13.0 - 17.0 g/dL    Comment: QA FLAGS AND/OR RANGES MODIFIED BY DEMOGRAPHIC UPDATE ON 01/08 AT 0406   HCT 38.9 (L) 39.0 - 52.0 %    Comment: QA FLAGS AND/OR RANGES MODIFIED BY DEMOGRAPHIC UPDATE ON 01/08 AT 0406   MCV 93.7 78.0 - 100.0 fL   MCH 30.6 26.0 - 34.0 pg   MCHC  32.6 30.0 - 36.0 g/dL   RDW 15.9 (H) 11.5 - 15.5 %   Platelets 136 (L) 150 - 400 K/uL   Neutrophils Relative % 44 %   Neutro Abs 3.2 1.7 - 7.7 K/uL   Lymphocytes Relative 43 %   Lymphs Abs 3.1 0.7 - 4.0 K/uL   Monocytes Relative 9 %   Monocytes Absolute 0.7 0.1 - 1.0 K/uL   Eosinophils Relative 4 %   Eosinophils Absolute 0.3 0.0 - 0.7 K/uL   Basophils Relative 0 %   Basophils Absolute 0.0 0.0 - 0.1 K/uL  Comprehensive metabolic panel     Status: Abnormal   Collection Time: 03/07/15  3:21 AM  Result Value Ref Range   Sodium 138 135 - 145 mmol/L   Potassium 3.3 (L) 3.5 - 5.1 mmol/L   Chloride 102 101 - 111 mmol/L   CO2 27 22 - 32 mmol/L   Glucose, Bld 138 (H) 65 - 99 mg/dL   BUN 14 6 - 20 mg/dL   Creatinine, Ser 1.11 0.61 - 1.24 mg/dL    Comment: QA FLAGS AND/OR RANGES MODIFIED BY DEMOGRAPHIC UPDATE ON 01/08 AT 0406   Calcium 8.1 (L) 8.9 - 10.3 mg/dL   Total Protein 6.4 (L) 6.5 - 8.1 g/dL   Albumin 2.4 (L) 3.5 - 5.0 g/dL   AST 69 (H) 15 - 41 U/L   ALT 30  17 - 63 U/L    Comment: QA FLAGS AND/OR RANGES MODIFIED BY DEMOGRAPHIC UPDATE ON 01/08 AT 0406   Alkaline Phosphatase 163 (H) 38 - 126 U/L   Total Bilirubin 0.7 0.3 - 1.2 mg/dL   GFR calc non Af Amer 54 (L) >60 mL/min   GFR calc Af Amer >60 >60 mL/min    Comment: (NOTE) The eGFR has been calculated using the CKD EPI equation. This calculation has not been validated in all clinical situations. eGFR's persistently <60 mL/min signify possible Chronic Kidney Disease.    Anion gap 9 5 - 15  Lipase, blood     Status: None   Collection Time: 03/07/15  3:21 AM  Result Value Ref Range   Lipase 39 11 - 51 U/L  Ethanol     Status: None   Collection Time: 03/07/15  3:21 AM  Result Value Ref Range   Alcohol, Ethyl (B) <5 <5 mg/dL    Comment:        LOWEST DETECTABLE LIMIT FOR SERUM ALCOHOL IS 5 mg/dL FOR MEDICAL PURPOSES ONLY   I-stat troponin, ED     Status: None   Collection Time: 03/07/15  3:26 AM  Result Value Ref Range   Troponin i, poc 0.00 0.00 - 0.08 ng/mL   Comment 3            Comment: Due to the release kinetics of cTnI, a negative result within the first hours of the onset of symptoms does not rule out myocardial infarction with certainty. If myocardial infarction is still suspected, repeat the test at appropriate intervals.   I-Stat CG4 Lactic Acid, ED     Status: Abnormal   Collection Time: 03/07/15  3:28 AM  Result Value Ref Range   Lactic Acid, Venous 2.06 (HH) 0.5 - 2.0 mmol/L   Comment NOTIFIED PHYSICIAN   I-stat chem 8, ed     Status: Abnormal   Collection Time: 03/07/15  3:29 AM  Result Value Ref Range   Sodium 141 135 - 145 mmol/L   Potassium 3.2 (L) 3.5 - 5.1 mmol/L  Chloride 100 (L) 101 - 111 mmol/L   BUN 16 6 - 20 mg/dL   Creatinine, Ser 1.00 0.61 - 1.24 mg/dL    Comment: QA FLAGS AND/OR RANGES MODIFIED BY DEMOGRAPHIC UPDATE ON 01/08 AT 0406   Glucose, Bld 131 (H) 65 - 99 mg/dL   Calcium, Ion 1.08 (L) 1.12 - 1.23 mmol/L   TCO2 25 0 - 100 mmol/L    Hemoglobin 14.6 13.0 - 17.0 g/dL    Comment: QA FLAGS AND/OR RANGES MODIFIED BY DEMOGRAPHIC UPDATE ON 01/08 AT 0406   HCT 43.0 39.0 - 52.0 %    Comment: QA FLAGS AND/OR RANGES MODIFIED BY DEMOGRAPHIC UPDATE ON 01/08 AT 0406   Dg Chest Port 1 View  03/07/2015  CLINICAL DATA:  Status post fall, with chest deformity. Level 1 trauma. Initial encounter. EXAM: PORTABLE CHEST 1 VIEW COMPARISON:  None. FINDINGS: The lungs are well-aerated. A trace left-sided pneumothorax is suggested. Multiple left-sided rib fractures are seen, with overlying soft tissue air. Left basilar opacity may reflect atelectasis or pulmonary parenchymal contusion. Underlying vascular congestion is noted. No definite pleural effusion is seen. The cardiomediastinal silhouette is mildly enlarged. No acute osseous abnormalities are identified. IMPRESSION: 1. Suggestion of trace left-sided pneumothorax. 2. Multiple left-sided rib fractures, with overlying soft tissue air. 3. Left basilar airspace opacity may reflect atelectasis or pulmonary parenchymal contusion. 4. Underlying vascular congestion and mild cardiomegaly. These results were called by telephone at the time of interpretation on 03/07/2015 at 3:28 am to Dr. Everlene Balls, who verbally acknowledged these results. Electronically Signed   By: Garald Balding M.D.   On: 03/07/2015 03:29    Review of Systems  Constitutional: Negative for fever and chills.  Eyes: Negative.   Respiratory: Positive for cough, shortness of breath and wheezing.   Cardiovascular: Positive for chest pain.  Gastrointestinal: Negative for abdominal pain.  Genitourinary: Negative.   Musculoskeletal: Negative.   Neurological: Negative.  Negative for headaches.    Blood pressure 130/75, pulse 93, temperature 97.4 F (36.3 C), temperature source Axillary, resp. rate 22, height _0  (1.803 m), weight 68.04 kg (150 lb), SpO2 94 %. Physical Exam  Constitutional: He is oriented to person, place, and time. No  distress. He is not intubated.  HENT:  Head: Normocephalic.  Eyes: Pupils are equal, round, and reactive to light.  Neck: Spinous process tenderness present.  Respiratory: Effort normal. No apnea, no tachypnea and no bradypnea. He is not intubated. No respiratory distress. He has decreased breath sounds. He has wheezes in the left lower field. He exhibits tenderness.    GI: Soft. Bowel sounds are normal. He exhibits no distension. There is no tenderness.    Musculoskeletal: Normal range of motion.  Neurological: He is alert and oriented to person, place, and time.  Skin: Skin is warm and dry.  Psychiatric: He has a normal mood and affect. His behavior is normal. Judgment and thought content normal.     Assessment/Plan Multiple left-sided rib fractures after fall  Anterior pneumothorax on left visible on CT scan not on plain films  Subcutaneous emphysema left chest wall  Heavy tobacco use  COPD  History of right pneumothorax spontaneous treated with chest tube  History of laparotomy  I discussed the findings with the patient and his wife at bedside. He looks quite comfortable. His blood pressure, saturations and respiratory rate are all within normal limits. I discussed with him the left-sided pneumothorax and the fact this may require a chest tube for treatment. He  has had a chest tube in the past on the right. He was opposed to placement of left-sided chest tube currently. I explained this may get larger and could potentially be life-threatening. He understands the need for a chest tube if his condition worsens.  We'll admit to ICU for pain control, pulmonary toilet and monitoring. He will need flexion-extension of the cervical spine  Elevate head of bed  Submental oxygen therapy  Tyechia Allmendinger A. 03/07/2015, 4:46 AM

## 2015-03-07 NOTE — ED Notes (Signed)
The pt has returned from c-t  He is more comfortbale sleeping a little   .  Still groaning with each breath,.. Male has seen at the bedside.  She says she is wife but  Does not kn ow  Some history of his.  Dr Mora Bellmanoni back in to see

## 2015-03-07 NOTE — Progress Notes (Signed)
   03/07/15 0300  Clinical Encounter Type  Visited With Family;Health care provider  Visit Type Initial;ED;Trauma  Referral From Nurse  Spiritual Encounters  Spiritual Needs Emotional  CH called to level 1 trauma; pt wife present; CH escorted wife to consult room B; pt wife awaiting MD update.  CH available as needed.  3:28 AM Erline LevineMichael I Passion Lavin

## 2015-03-07 NOTE — ED Notes (Signed)
Pt c/o lt sided chest pain when he fell on the ice walking his dog  .  Also  C/o neck pain crepitus lt chest

## 2015-03-08 ENCOUNTER — Inpatient Hospital Stay (HOSPITAL_COMMUNITY): Payer: Medicaid Other

## 2015-03-08 LAB — TYPE AND SCREEN
ABO/RH(D): O POS
ANTIBODY SCREEN: NEGATIVE
Unit division: 0
Unit division: 0

## 2015-03-08 LAB — PROTIME-INR
INR: 1.45 (ref 0.00–1.49)
PROTHROMBIN TIME: 17.7 s — AB (ref 11.6–15.2)

## 2015-03-08 NOTE — Progress Notes (Signed)
Went in to bladder scan patient due to no output yet on my shift, patient stated he felt like he could urinate. Patient attempting to use urinal at this time. Will continue to monitor.

## 2015-03-08 NOTE — Care Management Note (Signed)
Case Management Note  Patient Details  Name: Corky MullJames Riehl MRN: [id number] Date of Birth: Jul 26, 1955  Subjective/Objective:    Pt admitted on 03/07/15 s/p fall with multiple rib fractures, pneumothorax.  PTA, pt independent, lives with spouse.                  Action/Plan: Will follow for discharge planning as pt progresses.    Expected Discharge Date:                  Expected Discharge Plan:   Home/Self care  In-House Referral:   CM referral  Discharge planning Services     Post Acute Care Choice:    Choice offered to:     DME Arranged:    DME Agency:     HH Arranged:    HH Agency:     Status of Service:   In process, will continue to follow  Medicare Important Message Given:    Date Medicare IM Given:    Medicare IM give by:    Date Additional Medicare IM Given:    Additional Medicare Important Message give by:     If discussed at Long Length of Stay Meetings, dates discussed:    Additional Comments:  Quintella BatonJulie W. Amena Dockham, RN, BSN  Trauma/Neuro ICU Case Manager [phone]

## 2015-03-08 NOTE — Progress Notes (Signed)
[facility]  Progress Note   LOS: 1 day   Subjective: C/o trouble breathing, left chest pain, back pain which is chronic.  Feels congested, asks for mucinex.  Was on bipap, now on NRB satting 90-91%.  No other concerns.  Diet on hold while respiratory status improving.  Wife at bedside.  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  He says he doesn't take any other pain medications at home.  He says hes currently on 71mg  daily.  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   Objective: Vital signs in last 24 hours: Temp:  [97.6 F (36.4 C)-97.9 F (36.6 C)] 97.7 F (36.5 C) (01/09 0400) Pulse Rate:  [92-121] 110 (01/09 1000) Resp:  [7-28] 22 (01/09 1000) BP: (89-164)/(53-90) 149/75 mmHg (01/09 1000) SpO2:  [88 %-100 %] 91 % (01/09 1000) FiO2 (%):  [50 %-55 %] 55 % (01/09 1000)    Lab Results:  CBC  Recent Labs  03/07/15 0321 03/07/15 0329 03/07/15 0516  WBC 7.3  --  5.5  HGB 12.7* 14.6 12.4*  HCT 38.9* 43.0 37.4*  PLT 136*  --  119*   BMET  Recent Labs  03/07/15 0321 03/07/15 0329 03/07/15 0516  NA 138 141 136  K 3.3* 3.2* 4.0  CL 102 100* 105  CO2 27  --  23  GLUCOSE 138* 131* 117*  BUN 14 16 12   CREATININE 1.11 1.00 1.03  CALCIUM 8.1*  --  7.6*    Imaging: Dg Chest Port 1 View  03/07/2015  CLINICAL DATA:  Shortness of breath EXAM: PORTABLE CHEST 1 VIEW COMPARISON:  03/07/2015 FINDINGS: Moderate cardiac enlargement stable. Mild perihilar opacity on the right. More prominent retrocardiac left lower lobe opacity. Subcutaneous emphysema on the left. Small left apical pneumothorax. Small volume pneumomediastinum. IMPRESSION: Findings again consistent with left lower lobe contusion, small left pneumothorax, subcutaneous emphysema. Electronically Signed   By: Esperanza Heiraymond  Rubner M.D.   On: 03/07/2015 21:10   Dg Chest Port 1  View  03/07/2015  CLINICAL DATA:  Left-sided pneumothorax. EXAM: PORTABLE CHEST 1 VIEW COMPARISON:  CT of the chest 03/07/2015 FINDINGS: Cardiomediastinal silhouette is normal considering portable technique. There is left apical pneumothorax and small pneumomediastinum. There is no evidence of mediastinal shift. Left lower lobe lung contusion presents as a patchy airspace consolidation. Left-sided rib fractures are again seen. Healed right-sided rib fractures are noted. There is left chest wall emphysema. IMPRESSION: Small left apical pneumothorax and small left pneumomediastinum. Left lower lobe lung contusion. Posttraumatic left chest wall emphysema. Electronically Signed   By: Ted Mcalpineobrinka  Dimitrova M.D.   On: 03/07/2015 10:46   Dg Chest Port 1 View  03/07/2015  CLINICAL DATA:  Status post fall, with chest deformity. Level 1 trauma. Initial encounter. EXAM: PORTABLE CHEST 1 VIEW COMPARISON:  None. FINDINGS: The lungs are well-aerated. A trace left-sided pneumothorax is suggested. Multiple left-sided rib fractures are seen, with overlying soft tissue air. Left basilar opacity may reflect atelectasis or pulmonary parenchymal contusion. Underlying vascular congestion is noted. No definite pleural effusion is seen. The cardiomediastinal silhouette is mildly enlarged. No acute osseous abnormalities are identified. IMPRESSION: 1. Suggestion of trace left-sided pneumothorax. 2. Multiple left-sided rib fractures, with overlying soft tissue air. 3. Left basilar airspace opacity may reflect atelectasis or pulmonary parenchymal contusion. 4. Underlying vascular congestion and mild cardiomegaly. These  results were called by telephone at the time of interpretation on 03/07/2015 at 3:28 am to Dr. Tomasita Crumble, who verbally acknowledged these results. Electronically Signed   By: Roanna Raider M.D.   On: 03/07/2015 03:29   Dg Cerv Spine 3 Or Less V Flex And Ext Only  03/07/2015  CLINICAL DATA:  Patient fell on ice while walking a  dog, and neck pain EXAM: CERVICAL SPINE - FLEXION AND EXTENSION VIEWS ONLY COMPARISON:  CT scan performed 03/07/2015 FINDINGS: Mild anterior listhesis with flexion of C2 on C3 and C3 on C4. Listhesis is about 4 mm at C2-3 and 4 mm at C3-4. Overlying soft tissues obscure disc spaces below C4-5. With extension, there remains mild anterior listhesis of C2 on C3 of about 4 mm. The listhesis of C3 on C4 resolves. IMPRESSION: Mild anterior listhesis of C2 on C3 unchanged between flexion and extension positioning. Mild anterior listhesis of C3 on C4 with flexion that resolves with extension. This can be seen with ligamentous injury or laxity. Soft tissues obscure visualization below the C4-5 level. Electronically Signed   By: Esperanza Heir M.D.   On: 03/07/2015 15:39     PE: General: pleasant, mild respiratory distress on NRB, WD/WN white male who is laying in bed HEENT: head is normocephalic, atraumatic.  Sclera are noninjected.  PERRL.  Ears and nose without any masses or lesions.  Mouth is pink and moist Heart: regular, rate, and rhythm.  Normal s1,s2. No obvious murmurs, gallops, or rubs noted.  Palpable radial and pedal pulses bilaterally Lungs: Diminished breath sounds on right apical, no wheezes, rhonchi, or rales noted.  Respiratory effort mildly labored, sats 90-91%.  Left chest wall TTP. Abd: soft, NT/ND, +BS, no masses, hernias, or organomegaly MS: all 4 extremities are symmetrical with no cyanosis, clubbing, or edema. Skin: warm and dry with no masses, lesions, or rashes Psych: A&Ox3 with an appropriate affect.   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

## 2015-03-08 NOTE — Progress Notes (Signed)
Satting in mid 70s for several minutes after few sips of water. Pt re- placed on bipap. Sats up to 94%

## 2015-03-08 NOTE — Progress Notes (Signed)
I also advised pt to continue to wear C collar. He refused.

## 2015-03-09 ENCOUNTER — Encounter (HOSPITAL_COMMUNITY): Payer: Self-pay | Admitting: Adult Health

## 2015-03-09 ENCOUNTER — Inpatient Hospital Stay (HOSPITAL_COMMUNITY): Payer: Medicaid Other

## 2015-03-09 DIAGNOSIS — S2242XS Multiple fractures of ribs, left side, sequela: Secondary | ICD-10-CM

## 2015-03-09 DIAGNOSIS — J95821 Acute postprocedural respiratory failure: Secondary | ICD-10-CM | POA: Diagnosis present

## 2015-03-09 DIAGNOSIS — J939 Pneumothorax, unspecified: Secondary | ICD-10-CM

## 2015-03-09 DIAGNOSIS — J9601 Acute respiratory failure with hypoxia: Secondary | ICD-10-CM

## 2015-03-09 DIAGNOSIS — S272XXA Traumatic hemopneumothorax, initial encounter: Secondary | ICD-10-CM | POA: Diagnosis present

## 2015-03-09 DIAGNOSIS — W19XXXA Unspecified fall, initial encounter: Secondary | ICD-10-CM | POA: Diagnosis present

## 2015-03-09 LAB — BLOOD GAS, ARTERIAL
ACID-BASE DEFICIT: 0.5 mmol/L (ref 0.0–2.0)
ACID-BASE DEFICIT: 0.4 mmol/L (ref 0.0–2.0)
BICARBONATE: 25.5 meq/L — AB (ref 20.0–24.0)
Bicarbonate: 26 mEq/L — ABNORMAL HIGH (ref 20.0–24.0)
DRAWN BY: 331761
Drawn by: 33176
FIO2: 0.7
FIO2: 1
LHR: 18 {breaths}/min
MECHVT: 600 mL
MECHVT: 600 mL
O2 SAT: 85.5 %
O2 SAT: 98.9 %
PATIENT TEMPERATURE: 98.6
PCO2 ART: 55.8 mmHg — AB (ref 35.0–45.0)
PEEP/CPAP: 8 cmH2O
PEEP/CPAP: 8 cmH2O
PH ART: 7.24 — AB (ref 7.350–7.450)
PH ART: 7.282 — AB (ref 7.350–7.450)
PO2 ART: 56.8 mmHg — AB (ref 80.0–100.0)
PO2 ART: 149 mmHg — AB (ref 80.0–100.0)
Patient temperature: 98.6
RATE: 18 resp/min
TCO2: 28 mmol/L (ref 0–100)
TCO2: 27.2 mmol/L (ref 0–100)
pCO2 arterial: 63 mmHg (ref 35.0–45.0)

## 2015-03-09 LAB — BASIC METABOLIC PANEL
Anion gap: 4 — ABNORMAL LOW (ref 5–15)
BUN: 17 mg/dL (ref 6–20)
CALCIUM: 8.2 mg/dL — AB (ref 8.9–10.3)
CO2: 28 mmol/L (ref 22–32)
CREATININE: 0.8 mg/dL (ref 0.61–1.24)
Chloride: 112 mmol/L — ABNORMAL HIGH (ref 101–111)
GFR calc non Af Amer: >60 mL/min (ref >60)
Glucose, Bld: 122 mg/dL — ABNORMAL HIGH (ref 65–99)
Potassium: 4.5 mmol/L (ref 3.5–5.1)
SODIUM: 144 mmol/L (ref 135–145)

## 2015-03-09 LAB — CBC
HEMATOCRIT: 42.2 % (ref 39.0–52.0)
Hemoglobin: 13.1 g/dL (ref 13.0–17.0)
MCH: 30.5 pg (ref 26.0–34.0)
MCHC: 31 g/dL (ref 30.0–36.0)
MCV: 98.4 fL (ref 78.0–100.0)
Platelets: 106 10*3/uL — ABNORMAL LOW (ref 150–400)
RBC: 4.29 MIL/uL (ref 4.22–5.81)
RDW: 16.7 % — AB (ref 11.5–15.5)
WBC: 8.9 10*3/uL (ref 4.0–10.5)

## 2015-03-09 LAB — GLUCOSE, CAPILLARY
GLUCOSE-CAPILLARY: 108 mg/dL — AB (ref 65–99)
Glucose-Capillary: 77 mg/dL (ref 65–99)
Glucose-Capillary: 65 mg/dL (ref 65–99)

## 2015-03-09 LAB — TRIGLYCERIDES: Triglycerides: 127 mg/dL (ref <150)

## 2015-03-09 MED ORDER — MIDAZOLAM HCL 2 MG/2ML IJ SOLN
INTRAMUSCULAR | Status: AC
  Administered 2015-03-09: 2 mg via INTRAVENOUS
  Filled 2015-03-09: qty 2

## 2015-03-09 MED ORDER — PROPOFOL 1000 MG/100ML IV EMUL
0.0000 ug/kg/min | INTRAVENOUS | Status: DC
  Administered 2015-03-09: 50 ug/kg/min via INTRAVENOUS
  Administered 2015-03-09: 25 ug/kg/min via INTRAVENOUS
  Administered 2015-03-09: 35 ug/kg/min via INTRAVENOUS
  Administered 2015-03-09: 20 ug/kg/min via INTRAVENOUS
  Administered 2015-03-10 (×3): 25 ug/kg/min via INTRAVENOUS
  Administered 2015-03-10 – 2015-03-11 (×3): 30 ug/kg/min via INTRAVENOUS
  Administered 2015-03-11: 20 ug/kg/min via INTRAVENOUS
  Administered 2015-03-12 – 2015-03-14 (×5): 25 ug/kg/min via INTRAVENOUS
  Administered 2015-03-14: 12 ug/kg/min via INTRAVENOUS
  Administered 2015-03-14: 25 ug/kg/min via INTRAVENOUS
  Administered 2015-03-14 – 2015-03-15 (×2): 20 ug/kg/min via INTRAVENOUS
  Filled 2015-03-09 (×20): qty 100

## 2015-03-09 MED ORDER — FENTANYL CITRATE (PF) 100 MCG/2ML IJ SOLN
100.0000 ug | Freq: Once | INTRAMUSCULAR | Status: AC
  Administered 2015-03-09: 100 ug via INTRAVENOUS

## 2015-03-09 MED ORDER — ACETAMINOPHEN 325 MG PO TABS: 325.0000 mg | ORAL_TABLET | Freq: Four times a day (QID) | ORAL | Status: DC | PRN

## 2015-03-09 MED ORDER — SODIUM CHLORIDE 0.9 % IV SOLN
1.5000 g | Freq: Four times a day (QID) | INTRAVENOUS | Status: DC
  Administered 2015-03-09 – 2015-03-12 (×11): 1.5 g via INTRAVENOUS
  Filled 2015-03-09 (×14): qty 1.5

## 2015-03-09 MED ORDER — SODIUM CHLORIDE 0.9 % IV SOLN
INTRAVENOUS | Status: DC
  Administered 2015-03-09 – 2015-03-10 (×2): via INTRAVENOUS

## 2015-03-09 MED ORDER — BISACODYL 10 MG RE SUPP: 10.0000 mg | Freq: Every day | RECTAL | Status: DC | PRN

## 2015-03-09 MED ORDER — ETOMIDATE 2 MG/ML IV SOLN
20.0000 mg | Freq: Once | INTRAVENOUS | Status: AC
  Administered 2015-03-09: 20 mg via INTRAVENOUS

## 2015-03-09 MED ORDER — POTASSIUM CHLORIDE 20 MEQ/15ML (10%) PO SOLN
20.0000 meq | Freq: Every day | ORAL | Status: DC
  Administered 2015-03-09 – 2015-03-11 (×3): 20 meq via ORAL
  Filled 2015-03-09 (×3): qty 15

## 2015-03-09 MED ORDER — PANTOPRAZOLE SODIUM 40 MG IV SOLR
40.0000 mg | Freq: Every day | INTRAVENOUS | Status: DC
  Administered 2015-03-09 – 2015-03-10 (×2): 40 mg via INTRAVENOUS
  Filled 2015-03-09 (×2): qty 40

## 2015-03-09 MED ORDER — ANTISEPTIC ORAL RINSE SOLUTION (CORINZ)
7.0000 mL | Freq: Four times a day (QID) | OROMUCOSAL | Status: DC
  Administered 2015-03-09 – 2015-03-12 (×6): 7 mL via OROMUCOSAL

## 2015-03-09 MED ORDER — TRAMADOL HCL 50 MG PO TABS: 50.0000 mg | ORAL_TABLET | Freq: Four times a day (QID) | ORAL | Status: DC

## 2015-03-09 MED ORDER — DEXTROSE 50 % IV SOLN
25.0000 mL | Freq: Once | INTRAVENOUS | Status: AC
  Administered 2015-03-09: 25 mL via INTRAVENOUS

## 2015-03-09 MED ORDER — METHOCARBAMOL 500 MG PO TABS: 1000.0000 mg | ORAL_TABLET | Freq: Three times a day (TID) | ORAL | Status: DC | PRN

## 2015-03-09 MED ORDER — FENTANYL CITRATE (PF) 100 MCG/2ML IJ SOLN
100.0000 ug | INTRAMUSCULAR | Status: DC | PRN
  Administered 2015-03-09: 100 ug via INTRAVENOUS
  Filled 2015-03-09 (×2): qty 2

## 2015-03-09 MED ORDER — SODIUM CHLORIDE 0.9 % IV BOLUS (SEPSIS)
1000.0000 mL | Freq: Once | INTRAVENOUS | Status: AC
  Administered 2015-03-09: 1000 mL via INTRAVENOUS

## 2015-03-09 MED ORDER — IPRATROPIUM-ALBUTEROL 0.5-2.5 (3) MG/3ML IN SOLN: 3.0000 mL | RESPIRATORY_TRACT | Status: DC | PRN

## 2015-03-09 MED ORDER — MIDAZOLAM HCL 2 MG/2ML IJ SOLN
2.0000 mg | Freq: Once | INTRAMUSCULAR | Status: AC
  Administered 2015-03-09: 2 mg via INTRAVENOUS

## 2015-03-09 MED ORDER — CHLORHEXIDINE GLUCONATE 0.12% ORAL RINSE (MEDLINE KIT)
15.0000 mL | Freq: Two times a day (BID) | OROMUCOSAL | Status: DC
  Administered 2015-03-09 (×2): 15 mL via OROMUCOSAL

## 2015-03-09 MED ORDER — ROCURONIUM BROMIDE 50 MG/5ML IV SOLN
50.0000 mg | Freq: Once | INTRAVENOUS | Status: AC
  Administered 2015-03-09: 50 mg via INTRAVENOUS
  Filled 2015-03-09: qty 5

## 2015-03-09 MED ORDER — ANTISEPTIC ORAL RINSE SOLUTION (CORINZ)
7.0000 mL | Freq: Four times a day (QID) | OROMUCOSAL | Status: DC
  Administered 2015-03-09: 7 mL via OROMUCOSAL

## 2015-03-09 MED ORDER — DEXTROSE 50 % IV SOLN
INTRAVENOUS | Status: AC
  Administered 2015-03-09: 50 mL
  Filled 2015-03-09: qty 50

## 2015-03-09 MED ORDER — PROPOFOL 1000 MG/100ML IV EMUL
INTRAVENOUS | Status: AC
  Administered 2015-03-09: 50 ug/kg/min via INTRAVENOUS
  Filled 2015-03-09: qty 100

## 2015-03-09 MED ORDER — CHLORHEXIDINE GLUCONATE 0.12% ORAL RINSE (MEDLINE KIT)
15.0000 mL | Freq: Two times a day (BID) | OROMUCOSAL | Status: DC
  Administered 2015-03-09 – 2015-03-22 (×22): 15 mL via OROMUCOSAL
  Filled 2015-03-09: qty 15

## 2015-03-09 MED ORDER — FENTANYL CITRATE (PF) 100 MCG/2ML IJ SOLN
100.0000 ug | INTRAMUSCULAR | Status: DC | PRN
  Administered 2015-03-10 – 2015-03-11 (×9): 100 ug via INTRAVENOUS
  Filled 2015-03-09 (×8): qty 2

## 2015-03-09 MED ORDER — FENTANYL CITRATE (PF) 100 MCG/2ML IJ SOLN
INTRAMUSCULAR | Status: AC
  Filled 2015-03-09: qty 2

## 2015-03-09 NOTE — Procedures (Signed)
.  Intubation Procedure Note Corky MullJames Shively [id number] 26-Oct-1955  Procedure: Intubation Indications: Respiratory insufficiency  Procedure Details Consent: Risks of procedure as well as the alternatives and risks of each were explained to the (patient/caregiver).  Consent for procedure obtained. Time Out: Verified patient identification, verified procedure, site/side was marked, verified correct patient position, special equipment/implants available, medications/allergies/relevent history reviewed, required imaging and test results available.  Performed  MAC and 4 Medications:  Fentanyl 100 mcg Etomidate 20 mg Versed 2 mg NMB Rocuronium 50 mg Diprivan 20 mg iv    Evaluation Hemodynamic Status: BP stable throughout; O2 sats: stable throughout Patient's Current Condition: stable Complications: No apparent complications Patient did tolerate procedure well. Chest X-ray ordered to verify placement.  CXR: pending.   Brett CanalesSteve Minor ACNP [facility] Pager [phone] till 3 pm If no answer page [phone] 03/09/2015, 12:31 PM  Levy Pupaobert Krystalle Pilkington, MD, PhD 03/09/2015, 5:27 PM [facility] [phone] or if no answer [phone]

## 2015-03-09 NOTE — Progress Notes (Signed)
Dr Margaree Mackintoshsui notified of pt's Hypotension  72/62,  No u/o  Since 0745.  Bladder scan at 1700 hrs showed approx 200 cc urine.  Propofol decreased from 30 mcg/kg/min to  20 mmcg/kg/min.  Pt's CBG 65 at 200 hrs.  Given Dextrose 50% 25 ml with repeat CBG 105.  Orders received from Dr Margaree Mackintoshsui for NS 100 cc bolus and increase NS maintenance rate to 125 cc hr.,  Repeat BP 111/53 (78)  104 SR.  Will continue to monitor urinary response to fluid bolus.

## 2015-03-09 NOTE — Progress Notes (Signed)
Condom cath used instead of foley cath today per CAUTI prevention protocol.  Pt has yet to void.  Bladder scan volume at this time is .  Will pass on in report for next shift to continue to reevaluate need for i and o cath vs foley.

## 2015-03-09 NOTE — Progress Notes (Signed)
CRITICAL VALUE ALERT  Critical value received: CBG 65  Date of notification: 03/09/2015  Time of notification:  2004  Critical value read back: yes   Nurse who received :  Kevin FentonPaula Richa Shor, RN  MD notified (1st page): Demetrio Lappingraumai  Time of first page:   2027  MD notified (2nd page):NA   Time of second page: NA Responding MD:  Dr Margaree Mackintoshsui  Time MD responded: 2008

## 2015-03-09 NOTE — Progress Notes (Signed)
CRITICAL VALUE ALERT  Critical value received:  abg results (see epic)  Date of notification:  03/09/15  Time of notification:  1425  Critical value read back:Yes.    Nurse who received alert:  Maansi Wike rn   MD notified (1st page):  Marylou FlesherMagadalene Tukov NP - called on 2100  Time of first page:  1430  States "keep pt on 100% fiO2 and recheck abg at 1500"

## 2015-03-09 NOTE — Progress Notes (Signed)
Subjective: C/o pain. States pain med work but not lasting long enough; hurts a lot to cough. Requiring BiPaP to maintain O2 sats. When placed on NRB, O2 sats stay around 92 but pt feels he doesn't get enough oxygen  Objective: Vital signs in last 24 hours: Temp:  [98.2 F (36.8 C)-98.9 F (37.2 C)] 98.9 F (37.2 C) (01/10 0745) Pulse Rate:  [96-133] 133 (01/10 0805) Resp:  [10-30] 30 (01/10 0805) BP: (121-172)/(68-86) 165/83 mmHg (01/10 0805) SpO2:  [77 %-97 %] 93 % (01/10 0805) FiO2 (%):  [50 %-55 %] 50 % (01/10 0805) Last BM Date:  (pta)  Intake/Output from previous day: 01/09 0701 - 01/10 0700 In: 1875 [I.V.:1875] Out: 2150 [Urine:2150] Intake/Output this shift: Total I/O In: 75 [I.V.:75] Out: 300 [Urine:300]  Alert, nontoxic, oriented Upper airway congestion Coarse BS b/l Mild tachy Soft, nt, nd +SCDs, no edema  Lab Results:   Recent Labs  03/07/15 0516 03/09/15 0410  WBC 5.5 8.9  HGB 12.4* 13.1  HCT 37.4* 42.2  PLT 119* 106*   BMET  Recent Labs  03/07/15 0516 03/09/15 0410  NA 136 144  K 4.0 4.5  CL 105 112*  CO2 23 28  GLUCOSE 117* 122*  BUN 12 17  CREATININE 1.03 0.80  CALCIUM 7.6* 8.2*   Hepatic Function Latest Ref Rng 03/07/2015 03/07/2015  Total Protein 6.5 - 8.1 g/dL 6.2(L) 6.4(L)  Albumin 3.5 - 5.0 g/dL 2.2(L) 2.4(L)  AST 15 - 41 U/L 67(H) 69(H)  ALT 17 - 63 U/L 28 30  Alk Phosphatase 38 - 126 U/L 156(H) 163(H)  Total Bilirubin 0.3 - 1.2 mg/dL 0.8 0.7     PT/INR  Recent Labs  03/08/15 1353  LABPROT 17.7*  INR 1.45   ABG No results for input(s): PHART, HCO3 in the last 72 hours.  Invalid input(s): PCO2, PO2  Studies/Results: Dg Chest Port 1 View  03/09/2015  CLINICAL DATA:  Pneumothorax. EXAM: PORTABLE CHEST 1 VIEW COMPARISON:  03/08/2015 . FINDINGS: Mediastinum and hilar structures are stable. Stable cardiomegaly. No pulmonary venous congestion . Progressive right upper lobe infiltrate. Bibasilar atelectasis and/or mild  infiltrates. Previously identified left apical pneumothorax is less well identified, mild residual cannot be excluded. Left chest wall subcutaneous emphysema again noted. Left rib fractures again noted. IMPRESSION: 1. Small left apical pneumothorax is less well identified on today's exam, mild residual cannot be excluded . 2. Progressive dense right upper lobe infiltrate. Persistent bibasilar atelectasis and/or mild infiltrates. 3. Subcutaneous emphysema left chest wall. Multiple left rib fractures again noted. 4. Stable cardiomegaly.  No pulmonary venous congestion. Electronically Signed   By: Marcello Moores  Register   On: 03/09/2015 07:45   Dg Chest Port 1 View  03/08/2015  CLINICAL DATA:  Pneumothorax EXAM: PORTABLE CHEST 1 VIEW COMPARISON:  03/07/2015 FINDINGS: 5% left apical pneumothorax is slightly larger. Emphysema over the left chest wall is stable. Stable appearance of the lungs and heart. IMPRESSION: Left pneumothorax has slightly enlarged but remains small at 5%. Electronically Signed   By: Marybelle Killings M.D.   On: 03/08/2015 12:18   Dg Chest Port 1 View  03/07/2015  CLINICAL DATA:  Shortness of breath EXAM: PORTABLE CHEST 1 VIEW COMPARISON:  03/07/2015 FINDINGS: Moderate cardiac enlargement stable. Mild perihilar opacity on the right. More prominent retrocardiac left lower lobe opacity. Subcutaneous emphysema on the left. Small left apical pneumothorax. Small volume pneumomediastinum. IMPRESSION: Findings again consistent with left lower lobe contusion, small left pneumothorax, subcutaneous emphysema. Electronically Signed   By:  Skipper Cliche M.D.   On: 03/07/2015 21:10   Dg Cerv Spine 3 Or Less V Flex And Ext Only  03/07/2015  CLINICAL DATA:  Patient fell on ice while walking a dog, and neck pain EXAM: CERVICAL SPINE - FLEXION AND EXTENSION VIEWS ONLY COMPARISON:  CT scan performed 03/07/2015 FINDINGS: Mild anterior listhesis with flexion of C2 on C3 and C3 on C4. Listhesis is about 4 mm at C2-3 and 4  mm at C3-4. Overlying soft tissues obscure disc spaces below C4-5. With extension, there remains mild anterior listhesis of C2 on C3 of about 4 mm. The listhesis of C3 on C4 resolves. IMPRESSION: Mild anterior listhesis of C2 on C3 unchanged between flexion and extension positioning. Mild anterior listhesis of C3 on C4 with flexion that resolves with extension. This can be seen with ligamentous injury or laxity. Soft tissues obscure visualization below the C4-5 level. Electronically Signed   By: Skipper Cliche M.D.   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 On toradol. Check BMET. Consider adding muscle relaxant. Left pneumothorax - PTX small and stable Subcutaneous emphysema left chest wall Heavy tobacco use COPD - given significant COPD history and current pulm situation, will consult CCM/pulmonary for assistance.  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

## 2015-03-09 NOTE — Progress Notes (Signed)
No urine output after NS 100 cc bolus..  Bladder scan for 425 cc urine.  Results called to Dr Dema SeverinMungal, CCM.  Orders received for insertion of Foley catheter.  #16 Fr Foley inserted per protocol with return of 40 cc dark amber urine

## 2015-03-09 NOTE — Consult Note (Addendum)
Name: Benjamin Bell MRN: [id number] DOB: 07-28-1955    ADMISSION DATE:  03/07/2015   CONSULTATION DATE: 03/09/15  REFERRING MD : Trauma Team  CHIEF COMPLAINT:  Evolving respiratory failure s/p fall with multiple left rib fractures  BRIEF PATIENT DESCRIPTION: 60 YO male with a h/o hypertension and chronic back pain presenting with small left pneumothorax and multiple left rib fractures s/p fall  HISTORY OF PRESENT ILLNESS:  This is a 60 yo male with a h/o chronic back pain and hypertension admitted with left-sided chest pain and hypotension S/P fall at home. His imaging studies showed multiple rib fractures and a small left pneumothorax. He was started on BiPAP for respiratory distress and hypoxia. Patient is still c/o severe left chest wall pain and difficulty breathing on BiPAP.   He is a current every day smoker.   PAST MEDICAL HISTORY :   has no past medical history on file.  has no past surgical history on file. Prior to Admission medications   Medication Sig Start Date End Date Taking? Authorizing Provider  amLODipine (NORVASC) 10 MG tablet Take 10 mg by mouth daily.   Yes Historical Provider, MD  methadone (DOLOPHINE) 10 MG/5ML solution Take 71 mg by mouth daily.   Yes Historical Provider, MD  potassium chloride (KLOR-CON) 20 MEQ packet Take 20 mEq by mouth daily.   Yes Historical Provider, MD   No Known Allergies  FAMILY HISTORY:  family history is not on file. SOCIAL HISTORY:  reports that he has been smoking.  He does not have any smokeless tobacco history on file. He reports that he does not drink alcohol.  REVIEW OF SYSTEMS:   Constitutional: Negative for fever and chills.  HENT: Negative for congestion and rhinorrhea.  Eyes: Negative for redness and visual disturbance.  Respiratory: Positive for shortness of breath and pain with inspiration/expiration.  Cardiovascular: Negative for chest pain and palpitations.  Gastrointestinal: Negative  for nausea , vomiting and  abdominal pain and loose stools Genitourinary: Negative for dysuria and urgency.  Musculoskeletal: positive for left rib cage pain  Skin: Negative for pallor and wound.  Neurological: Negative for dizziness and headaches   SUBJECTIVE:   VITAL SIGNS: Temp:  [98.2 F (36.8 C)-98.9 F (37.2 C)] 98.9 F (37.2 C) (01/10 0745) Pulse Rate:  [96-133] 128 (01/10 1018) Resp:  [10-30] 26 (01/10 1018) BP: (121-172)/(68-86) 151/83 mmHg (01/10 1000) SpO2:  [77 %-97 %] 92 % (01/10 1018) FiO2 (%):  [50 %-55 %] 50 % (01/10 1000)  PHYSICAL EXAMINATION: General: Moderate distress Neuro: AAO X3, moves all extremities, no focal deficits HEENT: PERRLA, trachea midline Cardiovascular: Tachycardic, regular, S1/S2, no MRG Lungs: Significant use of accessory muscles, diminished airfow LLL, + wheezes and rhonchi Abdomen: Multiple healed scars, ventral hernia, normal BS Musculoskeletal: Pain with flexion of left arm, severe pian with gentle palpation of left ribcage Skin: Warm, no rash   Recent Labs Lab 03/07/15 0321 03/07/15 0329 03/07/15 0516 03/09/15 0410  NA 138 141 136 144  K 3.3* 3.2* 4.0 4.5  CL 102 100* 105 112*  CO2 27  --  23 28  BUN 14 16 12 17   CREATININE 1.11 1.00 1.03 0.80  GLUCOSE 138* 131* 117* 122*    Recent Labs Lab 03/07/15 0321 03/07/15 0329 03/07/15 0516 03/09/15 0410  HGB 12.7* 14.6 12.4* 13.1  HCT 38.9* 43.0 37.4* 42.2  WBC 7.3  --  5.5 8.9  PLT 136*  --  119* 106*   Studies  Dg Chest Port 1  View  03/09/2015  CLINICAL DATA:  Pneumothorax. EXAM: PORTABLE CHEST 1 VIEW COMPARISON:  03/08/2015 . FINDINGS: Mediastinum and hilar structures are stable. Stable cardiomegaly. No pulmonary venous congestion . Progressive right upper lobe infiltrate. Bibasilar atelectasis and/or mild infiltrates. Previously identified left apical pneumothorax is less well identified, mild residual cannot be excluded. Left chest wall subcutaneous emphysema again noted. Left rib fractures  again noted. IMPRESSION: 1. Small left apical pneumothorax is less well identified on today's exam, mild residual cannot be excluded . 2. Progressive dense right upper lobe infiltrate. Persistent bibasilar atelectasis and/or mild infiltrates. 3. Subcutaneous emphysema left chest wall. Multiple left rib fractures again noted. 4. Stable cardiomegaly.  No pulmonary venous congestion. Electronically Signed   By: Maisie Fushomas  Register   On: 03/09/2015 07:45   Dg Chest Port 1 View  03/08/2015  CLINICAL DATA:  Pneumothorax EXAM: PORTABLE CHEST 1 VIEW COMPARISON:  03/07/2015 FINDINGS: 5% left apical pneumothorax is slightly larger. Emphysema over the left chest wall is stable. Stable appearance of the lungs and heart. IMPRESSION: Left pneumothorax has slightly enlarged but remains small at 5%. Electronically Signed   By: Jolaine ClickArthur  Hoss M.D.   On: 03/08/2015 12:18   Dg Chest Port 1 View  03/07/2015  CLINICAL DATA:  Shortness of breath EXAM: PORTABLE CHEST 1 VIEW COMPARISON:  03/07/2015 FINDINGS: Moderate cardiac enlargement stable. Mild perihilar opacity on the right. More prominent retrocardiac left lower lobe opacity. Subcutaneous emphysema on the left. Small left apical pneumothorax. Small volume pneumomediastinum. IMPRESSION: Findings again consistent with left lower lobe contusion, small left pneumothorax, subcutaneous emphysema. Electronically Signed   By: Esperanza Heiraymond  Rubner M.D.   On: 03/07/2015 21:10   Dg Cerv Spine 3 Or Less V Flex And Ext Only  03/07/2015  CLINICAL DATA:  Patient fell on ice while walking a dog, and neck pain EXAM: CERVICAL SPINE - FLEXION AND EXTENSION VIEWS ONLY COMPARISON:  CT scan performed 03/07/2015 FINDINGS: Mild anterior listhesis with flexion of C2 on C3 and C3 on C4. Listhesis is about 4 mm at C2-3 and 4 mm at C3-4. Overlying soft tissues obscure disc spaces below C4-5. With extension, there remains mild anterior listhesis of C2 on C3 of about 4 mm. The listhesis of C3 on C4 resolves.  IMPRESSION: Mild anterior listhesis of C2 on C3 unchanged between flexion and extension positioning. Mild anterior listhesis of C3 on C4 with flexion that resolves with extension. This can be seen with ligamentous injury or laxity. Soft tissues obscure visualization below the C4-5 level. Electronically Signed   By: Esperanza Heiraymond  Rubner M.D.   On: 03/07/2015 15:39   SIGNIFICANT EVENTS  01/08-Unwitnessed fall at home>ED>multiple left rib fractures 01/10: Increased work of breathing on BiPAP> intubated to optimal pain management and oxygenation  ASSESSMENT / PLAN:  PULMONARY A: Acute respiratory failure secondary to trauma/multiple displaced rib fractures; failed BiPAP Right upper lobe opacity; ? CAP, afebrile and no leukocytosis Left pneumothorax P:   Intubate to optimize pain management and oxygenation VAP protocol CXR Bronchodilators  CARDIOVASCULAR A:  Hypertension Tachycardia P:  Continue amlodipine Hemodynamic monitoring per ICU protocol  RENAL A:   Hypokalemia-Resloved P:   Monitor and replace electrolytes  GASTROINTESTINAL A:   NPO Ventral hernia P:   PPI for GI prophylaxis  HEMATOLOGIC A:   Thrombocytopenia P:  Monitor CBC  INFECTIOUS A:   Right upper lobe infiltrate-afebrile without leukocytosis-doubt CAP but consider evolving aspiration PNA P:   Start empiric unasyn F/U cultures  ENDOCRINE A:   No acute  issues  P:   Gluose monitoring with BMP  NEUROLOGIC A:   Chronic back pain on home methadone Vent sedation P:   RASS goal: -1 Propofol gtt and prn fentany   FAMILY  - Updates: No family at bedside  - Inter-disciplinary family meet or Palliative Care meeting due by:  day 7  Magdalene S. Luci Bank, NP-C Pulmonary and Critical Care Medicine [facility] Pager: [phone]  03/09/2015, 10:55 AM  Attending Note:  I have examined patient, reviewed labs, studies and notes. I have discussed the case with Brendolyn Patty, and I agree with the data  and plans as amended above. Pt has a hx of smoking, probable undiagnosed COPD. Suffered a traumatic fall 03/07/15 that resulted in several L rib fractures, a small L ptx that has been stable. He has experienced progressive dyspnea since then, with small volumes and pain on inspiration. He has tried to participate with IS but gets very small volumes. On am of 1/10 he was placed on BiPAP. We were consulted to evaluate him. On my exam he was awake, aware but in respiratory distress. He was tachypneic on BiPAP with B basilar crackles on exam. Very tender to palpation on L costal margin. CXR showed LLL atx and an evolving R mid-lung infiltrate. Based on his WOB I recommended intubation and MV. We will plan to continue MV, BD's. Based on CXR I will start unasyn for possible aspiration PNA.  Independent critical care time is 40 minutes.   Levy Pupa, MD, PhD 03/09/2015, 5:17 PM [facility] [phone] or if no answer [phone]

## 2015-03-10 ENCOUNTER — Inpatient Hospital Stay (HOSPITAL_COMMUNITY): Payer: Medicaid Other

## 2015-03-10 DIAGNOSIS — J95821 Acute postprocedural respiratory failure: Secondary | ICD-10-CM

## 2015-03-10 LAB — GLUCOSE, CAPILLARY
GLUCOSE-CAPILLARY: 71 mg/dL (ref 65–99)
GLUCOSE-CAPILLARY: 85 mg/dL (ref 65–99)
GLUCOSE-CAPILLARY: 77 mg/dL (ref 65–99)
GLUCOSE-CAPILLARY: 87 mg/dL (ref 65–99)
GLUCOSE-CAPILLARY: 98 mg/dL (ref 65–99)
Glucose-Capillary: 66 mg/dL (ref 65–99)
Glucose-Capillary: 81 mg/dL (ref 65–99)
Glucose-Capillary: 58 mg/dL — ABNORMAL LOW (ref 65–99)
Glucose-Capillary: 106 mg/dL — ABNORMAL HIGH (ref 65–99)
Glucose-Capillary: 68 mg/dL (ref 65–99)

## 2015-03-10 LAB — COMPREHENSIVE METABOLIC PANEL
ALBUMIN: 1.6 g/dL — AB (ref 3.5–5.0)
ALK PHOS: 82 U/L (ref 38–126)
ALT: 23 U/L (ref 17–63)
AST: 60 U/L — AB (ref 15–41)
Anion gap: 6 (ref 5–15)
BILIRUBIN TOTAL: 0.6 mg/dL (ref 0.3–1.2)
BUN: 27 mg/dL — AB (ref 6–20)
CALCIUM: 7.8 mg/dL — AB (ref 8.9–10.3)
CO2: 24 mmol/L (ref 22–32)
Chloride: 117 mmol/L — ABNORMAL HIGH (ref 101–111)
Creatinine, Ser: 0.85 mg/dL (ref 0.61–1.24)
GFR calc Af Amer: >60 mL/min (ref >60)
Glucose, Bld: 83 mg/dL (ref 65–99)
POTASSIUM: 3.9 mmol/L (ref 3.5–5.1)
Sodium: 147 mmol/L — ABNORMAL HIGH (ref 135–145)
TOTAL PROTEIN: 4.8 g/dL — AB (ref 6.5–8.1)

## 2015-03-10 LAB — CBC
HEMATOCRIT: 38.2 % — AB (ref 39.0–52.0)
Hemoglobin: 11.6 g/dL — ABNORMAL LOW (ref 13.0–17.0)
MCH: 30.1 pg (ref 26.0–34.0)
MCHC: 30.4 g/dL (ref 30.0–36.0)
MCV: 99 fL (ref 78.0–100.0)
PLATELETS: 100 10*3/uL — AB (ref 150–400)
RBC: 3.86 MIL/uL — ABNORMAL LOW (ref 4.22–5.81)
RDW: 16.9 % — AB (ref 11.5–15.5)
WBC: 5.3 10*3/uL (ref 4.0–10.5)

## 2015-03-10 LAB — PHOSPHORUS: PHOSPHORUS: 1.6 mg/dL — AB (ref 2.5–4.6)

## 2015-03-10 LAB — MAGNESIUM: MAGNESIUM: 2 mg/dL (ref 1.7–2.4)

## 2015-03-10 LAB — TROPONIN I

## 2015-03-10 MED ORDER — VITAL HIGH PROTEIN PO LIQD: 1000.0000 mL | ORAL | Status: DC

## 2015-03-10 MED ORDER — DEXTROSE 50 % IV SOLN
25.0000 mL | Freq: Once | INTRAVENOUS | Status: AC
  Administered 2015-03-10: 25 mL via INTRAVENOUS

## 2015-03-10 MED ORDER — IPRATROPIUM-ALBUTEROL 0.5-2.5 (3) MG/3ML IN SOLN
3.0000 mL | Freq: Four times a day (QID) | RESPIRATORY_TRACT | Status: DC
  Administered 2015-03-10 – 2015-03-15 (×20): 3 mL via RESPIRATORY_TRACT
  Filled 2015-03-10 (×20): qty 3

## 2015-03-10 MED ORDER — VANCOMYCIN HCL 10 G IV SOLR
1750.0000 mg | Freq: Once | INTRAVENOUS | Status: AC
  Administered 2015-03-10: 1750 mg via INTRAVENOUS
  Filled 2015-03-10 (×2): qty 1750

## 2015-03-10 MED ORDER — ALBUTEROL SULFATE (2.5 MG/3ML) 0.083% IN NEBU
2.5000 mg | INHALATION_SOLUTION | RESPIRATORY_TRACT | Status: DC | PRN
  Administered 2015-03-15: 2.5 mg via RESPIRATORY_TRACT
  Filled 2015-03-10: qty 3

## 2015-03-10 MED ORDER — VANCOMYCIN HCL IN DEXTROSE 1-5 GM/200ML-% IV SOLN
1000.0000 mg | Freq: Three times a day (TID) | INTRAVENOUS | Status: DC
  Administered 2015-03-10 – 2015-03-11 (×2): 1000 mg via INTRAVENOUS
  Filled 2015-03-10 (×4): qty 200

## 2015-03-10 MED ORDER — MIDAZOLAM HCL 2 MG/2ML IJ SOLN
2.0000 mg | INTRAMUSCULAR | Status: DC | PRN
  Administered 2015-03-15 – 2015-03-16 (×5): 2 mg via INTRAVENOUS
  Filled 2015-03-10 (×5): qty 2

## 2015-03-10 MED ORDER — AMLODIPINE BESYLATE 10 MG PO TABS
10.0000 mg | ORAL_TABLET | Freq: Every day | ORAL | Status: DC
  Administered 2015-03-11: 10 mg
  Filled 2015-03-10 (×3): qty 1

## 2015-03-10 MED ORDER — DEXTROSE 50 % IV SOLN
INTRAVENOUS | Status: AC
  Administered 2015-03-10: 25 mL
  Filled 2015-03-10: qty 50

## 2015-03-10 MED ORDER — ACETAMINOPHEN 160 MG/5ML PO SOLN: 650.0000 mg | Freq: Four times a day (QID) | ORAL | Status: DC | PRN

## 2015-03-10 MED ORDER — DEXTROSE-NACL 5-0.9 % IV SOLN
INTRAVENOUS | Status: DC
  Administered 2015-03-10: 09:00:00 via INTRAVENOUS

## 2015-03-10 MED ORDER — PANTOPRAZOLE SODIUM 40 MG PO PACK
40.0000 mg | PACK | ORAL | Status: DC
  Administered 2015-03-10 – 2015-03-14 (×5): 40 mg
  Filled 2015-03-10 (×5): qty 20

## 2015-03-10 MED ORDER — DEXTROSE 50 % IV SOLN: 25.0000 mL | Freq: Once | INTRAVENOUS | Status: DC

## 2015-03-10 MED ORDER — PIVOT 1.5 CAL PO LIQD
1000.0000 mL | ORAL | Status: DC
  Administered 2015-03-10 – 2015-03-14 (×5): 1000 mL

## 2015-03-10 MED ORDER — LACTATED RINGERS IV SOLN
INTRAVENOUS | Status: DC
  Administered 2015-03-10: 14:00:00 via INTRAVENOUS

## 2015-03-10 NOTE — Progress Notes (Signed)
PULMONARY / CRITICAL CARE MEDICINE   Name: Benjamin Bell MRN: [id number] DOB: Jan 29, 1956    ADMISSION DATE:  03/07/2015 CONSULTATION DATE:  03/09/2015  REFERRING MD:  Trauma  CHIEF COMPLAINT:  Fall  SUBJECTIVE:  Sedated.  VITAL SIGNS: BP 98/63 mmHg  Pulse 101  Temp(Src) 96.7 F (35.9 C) (Axillary)  Resp 21  Ht 5\' 11"  (1.803 m)  Wt 182 lb 8.7 oz (82.8 kg)  BMI 25.47 kg/m2  SpO2 94%  HEMODYNAMICS:    VENTILATOR SETTINGS: Vent Mode:  [-] PRVC FiO2 (%):  [50 %-100 %] 50 % Set Rate:  [18 bmp] 18 bmp Vt Set:  [520 mL-600 mL] 520 mL PEEP:  [5 cmH20-8 cmH20] 6 cmH20 Plateau Pressure:  [16 cmH20-18 cmH20] 18 cmH20  INTAKE / OUTPUT: I/O last 3 completed shifts: In: 4570.1 [P.O.:180; I.V.:3180.1; NG/GT:60; IV Piggyback:1150] Out: 2200 [Urine:2200]  PHYSICAL EXAMINATION: General:  sedated Neuro:  RASS -2 HEENT:  ETT in place Cardiovascular:  Regular, tachycardic Lungs:  No wheeze Abdomen:  soft Musculoskeletal:  No edema Skin:  No rashes  LABS:  BMET  Recent Labs Lab 03/07/15 0516 03/09/15 0410 03/10/15 0348  NA 136 144 147*  K 4.0 4.5 3.9  CL 105 112* 117*  CO2 23 28 24   BUN 12 17 27*  CREATININE 1.03 0.80 0.85  GLUCOSE 117* 122* 83    Electrolytes  Recent Labs Lab 03/07/15 0516 03/09/15 0410 03/10/15 0348  CALCIUM 7.6* 8.2* 7.8*  MG  --   --  2.0  PHOS  --   --  1.6*    CBC  Recent Labs Lab 03/07/15 0516 03/09/15 0410 03/10/15 0348  WBC 5.5 8.9 5.3  HGB 12.4* 13.1 11.6*  HCT 37.4* 42.2 38.2*  PLT 119* 106* 100*    Coag's  Recent Labs Lab 03/08/15 1353  INR 1.45    Sepsis Markers  Recent Labs Lab 03/07/15 0328  LATICACIDVEN 2.06*    ABG  Recent Labs Lab 03/09/15 1413 03/09/15 1550  PHART 7.240* 7.282*  PCO2ART 63.0* 55.8*  PO2ART 56.8* 149*    Liver Enzymes  Recent Labs Lab 03/07/15 0321 03/07/15 0516 03/10/15 0348  AST 69* 67* 60*  ALT 30 28 23   ALKPHOS 163* 156* 82  BILITOT 0.7 0.8 0.6  ALBUMIN 2.4*  2.2* 1.6*    Cardiac Enzymes No results for input(s): TROPONINI, PROBNP in the last 168 hours.  Glucose  Recent Labs Lab 03/09/15 2001 03/09/15 2021 03/09/15 2327 03/10/15 0133 03/10/15 0404 03/10/15 0828  GLUCAP 65 108* 66 81 71 58*    Imaging Dg Abd 1 View  03/09/2015  CLINICAL DATA:  Nasogastric tube placement. EXAM: ABDOMEN - 1 VIEW COMPARISON:  None. FINDINGS: A feeding tube is identified traversing the stomach and duodenum with the tip near the expected position of the ligament of Treitz. A separate decompressive tube is not seen. There are multiple leads overlapping the region of the lower chest and it is unclear whether there may be a separate to in the distal esophagus. IMPRESSION: Feeding tube extends to the level of the ligament of Treitz. Separate gastric decompression tube is difficult to visualize, if present. If there is a separate tube present, recommend tube advancement and repeat of a film centered slightly higher to include more of the lower chest. Removal/ movement of monitoring lead wires would also be helpful. Electronically Signed   By: Irish Lack M.D.   On: 03/09/2015 20:05   Dg Chest Port 1 View  03/10/2015  CLINICAL DATA:  60 year old male status post endotracheal tube placement. EXAM: PORTABLE CHEST 1 VIEW COMPARISON:  Chest radiograph dated 03/09/2015 FINDINGS: An endotracheal tube is seen with tip approximately 5 cm above the carina. And enteric tube extends into the left hemi abdomen with tip beyond the image cut off. A focal area of airspace opacity is noted in the right mid lung field. There is emphysematous changes of the lungs. No pleural effusion or pneumothorax. There is irregularity of the left posterior sixth rib concerning for a fracture. Clinical correlation is recommended. Left lateral chest wall soft tissue emphysema. IMPRESSION: Endotracheal tube above the carina. Emphysema with focal right mid lung field airspace opacity. Focal discontinuity of  the left posterior sixth rib concerning for fracture. Clinical correlation is recommended. Left chest wall soft tissue emphysema. No pneumothorax. Electronically Signed   By: Elgie CollardArash  Radparvar M.D.   On: 03/10/2015 06:20   Portable Chest Xray  03/09/2015  CLINICAL DATA:  Tachycardia, decreased oxygenation EXAM: PORTABLE CHEST 1 VIEW COMPARISON:  03/09/2015 FINDINGS: There is increasing right upper lobe consolidation. There is right lower lobe airspace disease. There is left lung interstitial thickening. There is an endotracheal tube with the tip 4.1 cm above the carina. There is no pleural effusion. There is a tiny left apical pneumothorax. Soft tissue emphysema in the neck soft tissues. The heart and mediastinal contours are unremarkable. The osseous structures are unremarkable. IMPRESSION: 1. Increased right upper lobe consolidation and worsening right lower lobe airspace disease concerning for multilobar pneumonia. 2. Endotracheal tube with the tip 4.1 cm above the carina. 3. Tiny residual left apical pneumothorax. Subcutaneous emphysema in the neck soft tissues. Electronically Signed   By: Elige KoHetal  Patel   On: 03/09/2015 13:22     STUDIES:   CULTURES: 1/10 Blood >> GPC >> 1/10 Sputum >>   ANTIBIOTICS: 1/10 Unasyn >> 1/11 Vancomycin >>   SIGNIFICANT EVENTS: 1/08 Fall 1/10 VDRF  LINES/TUBES: 1/10 ETT >>   DISCUSSION: 60 yo male smoker fell and had Lt rib fx with Lt PTX.  Developed progressive respiratory failure and required intubation.  ASSESSMENT / PLAN:  PULMONARY A: Acute hypoxic respiratory failure. Tobacco abuse. P:   Full vent support F/u CXR Scheduled BDs  CARDIOVASCULAR A:  Hx of HTN. P:  Continue norvasc  RENAL A:   Hypernatremia. P:   F/u BMET Change IV fluid to lactated ringers  GASTROINTESTINAL A:   Nutrition. P:   Tube feeds while on vent Protonix for SUP  HEMATOLOGIC A:   Thrombocytopenia. P:  F/u CBC Lovenox for DVT  prevention  INFECTIOUS A:   Possible aspiration pneumonia. P:   Day 2 unasyn  ENDOCRINE A:   Hypoglycemia. P:   Monitor blood sugar on BMET  NEUROLOGIC A:   Acute encephalopathy. P:   RASS goal: -1  Updated pt's family at bedside.  CC time 34 minutes.  Coralyn HellingVineet Jamiah Recore, MD [facility] Pulmonary/Critical Care 03/10/2015, 10:52 AM Pager:  [phone] After 3pm call: [phone]

## 2015-03-10 NOTE — Progress Notes (Signed)
ANTIBIOTIC CONSULT NOTE - INITIAL  Pharmacy Consult for Vancomycin Indication: Bacteremia  No Known Allergies  Patient Measurements: Height: 5\' 11"  (180.3 cm) Weight: 182 lb 8.7 oz (82.8 kg) IBW/kg (Calculated) : 75.3  Vital Signs: Temp: 96.7 F (35.9 C) (01/11 0800) Temp Source: Axillary (01/11 0800) BP: 98/63 mmHg (01/11 1000) Pulse Rate: 101 (01/11 1000) Intake/Output from previous day: 01/10 0701 - 01/11 0700 In: 3670.1 [P.O.:180; I.V.:2280.1; NG/GT:60; IV Piggyback:1150] Out: 1200 [Urine:1200] Intake/Output from this shift: Total I/O In: 405.6 [I.V.:405.6] Out: 205 [Urine:205]  Labs:  Recent Labs  03/09/15 0410 03/10/15 0348  WBC 8.9 5.3  HGB 13.1 11.6*  PLT 106* 100*  CREATININE 0.80 0.85   Estimated Creatinine Clearance: 100.9 mL/min (by C-G formula based on Cr of 0.85). No results for input(s): VANCOTROUGH, VANCOPEAK, VANCORANDOM, GENTTROUGH, GENTPEAK, GENTRANDOM, TOBRATROUGH, TOBRAPEAK, TOBRARND, AMIKACINPEAK, AMIKACINTROU, AMIKACIN in the last 72 hours.   Microbiology: Recent Results (from the past 720 hour(s))  MRSA PCR Screening     Status: Abnormal   Collection Time: 03/07/15  8:19 AM  Result Value Ref Range Status   MRSA by PCR POSITIVE (A) NEGATIVE Final    Comment:        The GeneXpert MRSA Assay (FDA approved for NASAL specimens only), is one component of a comprehensive MRSA colonization surveillance program. It is not intended to diagnose MRSA infection nor to guide or monitor treatment for MRSA infections. CRITICAL RESULT CALLED TO, READ BACK BY AND VERIFIED WITH: SPOKE TO RN K.HYATT @ 11:16 BY K.PEELE   Culture, blood (routine x 2)     Status: None (Preliminary result)   Collection Time: 03/09/15  1:40 PM  Result Value Ref Range Status   Specimen Description BLOOD RIGHT ARM  Final   Special Requests BOTTLES DRAWN AEROBIC AND ANAEROBIC 10CC  Final   Culture  Setup Time   Final    GRAM POSITIVE COCCI IN CLUSTERS IN BOTH AEROBIC AND  ANAEROBIC BOTTLES CRITICAL RESULT CALLED TO, READ BACK BY AND VERIFIED WITH: J PINEDA 03/10/15 @ 0959 M VESTAL    Culture GRAM POSITIVE COCCI  Final   Report Status PENDING  Incomplete  Culture, blood (routine x 2)     Status: None (Preliminary result)   Collection Time: 03/09/15  1:48 PM  Result Value Ref Range Status   Specimen Description BLOOD RIGHT HAND  Final   Special Requests   Final    BOTTLES DRAWN AEROBIC AND ANAEROBIC 4CC AER 5CC ANA   Culture PENDING  Incomplete   Report Status PENDING  Incomplete  Culture, respiratory (NON-Expectorated)     Status: None (Preliminary result)   Collection Time: 03/09/15  3:01 PM  Result Value Ref Range Status   Specimen Description TRACHEAL ASPIRATE  Final   Special Requests Normal  Final   Gram Stain   Final    MODERATE WBC PRESENT,BOTH PMN AND MONONUCLEAR RARE SQUAMOUS EPITHELIAL CELLS PRESENT FEW GRAM POSITIVE COCCI IN PAIRS IN CLUSTERS FEW GRAM POSITIVE RODS Performed at [facility]    Culture   Final    Culture reincubated for better growth Performed at [facility]    Report Status PENDING  Incomplete    Medical History: Past Medical History  Diagnosis Date  . Chronic back pain   . Hypertension     Assessment: 60 yo M presents on 1/8 with L sided chest pain. Blood cx on 1/10 now has 1/2 GPC in clusters. Pharmacy to dose vancomycin. Afebrile, WBC wnl. Already on  Unasyn per MD. SCr stable, CrCl ~181ml/min.  Goal of Therapy:  Vancomycin trough level 15-20 mcg/ml  Resolution of infection  Plan:  Give vancomycin 1.75g IV x 1, then start vancomycin 1g IV Q8 Continue Unasyn 1.5g IV Q6 per MD Monitor clinical picture, renal function, VT at Css F/U C&S, abx deescalation / LOT   Enzo Bi, PharmD, BCPS Clinical Pharmacist Pager [phone] 03/10/2015 10:58 AM

## 2015-03-10 NOTE — Progress Notes (Signed)
CRITICAL VALUE ALERT  Critical value received:  Blood cultures growing gram + cocci in clusters  Date of notification:  03/10/2015  Time of notification:  1000  Critical value read back:Yes.    Nurse who received alert:  Serena ColonelYeni Pineda, RN  MD notified (1st page):  Dr. Magnus IvanBlackman  Time of first page:  1001  MD notified (2nd page):  Time of second page:  Responding MD:  Dr. Magnus IvanBlackman  Time MD responded:  1004

## 2015-03-10 NOTE — Progress Notes (Addendum)
CRITICAL VALUE ALERT  Critical value received:  CBG: 66  Date of notification:  03/09/15  Time of notification:  2330  Critical value read back:Yes.    Nurse who received alert:  Fransisco HertzJones, Iman Orourke D, RN  MD notified (1st page):  Marisue IvanLiz, RN with [facility]  Time of first page:  2330  MD notified (2nd page):  Time of second page:  Responding MD:    Time MD responded:   Informed MD in another call. Wanted to verify if MD wanted to start Dextrose in maintenance fluids. [facility] to inform MD.  ~0100: Tx CBG per protocol. 15MIN f/u CBG 81. Will continue to monitor closely.

## 2015-03-10 NOTE — Progress Notes (Signed)
Initial Nutrition Assessment  INTERVENTION:  Initiate Pivot 1.5 @ 25 ml/hr via Cortrak (tip LOT) and increase by 10 ml every 4 hours to goal rate of 45 ml/hr.   Tube feeding regimen provides 1620 kcal, 101 grams of protein, and 819 ml of H2O.  TF regimen and propofol at current rate providing 1889 total kcal/day (100 % of kcal needs)  NUTRITION DIAGNOSIS:   Inadequate oral intake related to inability to eat as evidenced by NPO status.  GOAL:   Patient will meet greater than or equal to 90% of their needs  MONITOR:   TF tolerance, I & O's, Vent status, Labs  REASON FOR ASSESSMENT:   Consult Enteral/tube feeding initiation and management  ASSESSMENT:   60 yo male smoker fell and had Lt rib fx with Lt PTX. Developed progressive respiratory failure and required intubation. Incidental findings:  2 liver masses, RUL nodule, left adrenal nodule, and AAA.   Patient is currently intubated on ventilator support MV: 11.7 L/min Temp (24hrs), Avg:98.4 F (36.9 C), Min:96.7 F (35.9 C), Max:99.7 F (37.6 C)  Propofol: 10.2 ml/hr provides: 269 kcal per day from lipid  Medications reviewed and include: senokot-s, KCl Labs reviewed: sodium elevated (147), phosphorus low (1.6) CBG's: 58-71 Cortrak tube placed 1/10, tip at Ligament of Treitz  Pt discussed during ICU rounds and with RN.   Diet Order:  Diet NPO time specified  Skin:  Reviewed, no issues  Last BM:  unknown  Height:   Ht Readings from Last 1 Encounters:  03/07/15 5\' 11"  (1.803 m)   Weight:   Wt Readings from Last 1 Encounters:  03/10/15 182 lb 8.7 oz (82.8 kg)  Admission weight: 150 lb (68 kg)  Ideal Body Weight:  78.1 kg  BMI:  Body mass index is 25.47 kg/(m^2).  Estimated Nutritional Needs:   Kcal:  1894  Protein:  100-115 grams  Fluid:  > 1.9 L/day  EDUCATION NEEDS:   No education needs identified at this time  Kendell BaneHeather Nisaiah Bechtol RD, LDN, CNSC [phone] Pager [phone] After Hours Pager

## 2015-03-10 NOTE — Progress Notes (Signed)
Hypoglycemic Event  CBG: 68  Treatment: D50 IV 25 mL  Symptoms: None  Follow-up CBG: Time:1805 CBG Result:106  Possible Reasons for Event: Inadequate meal intake  Comments/MD notified: CCM notified.     PINEDA, Dreyton Roessner E

## 2015-03-10 NOTE — Progress Notes (Signed)
Subjective: Intubated and sedated Issue with intermittent tachycardia  Objective: Vital signs in last 24 hours: Temp:  [97.7 F (36.5 C)-99.7 F (37.6 C)] 97.7 F (36.5 C) (01/11 0400) Pulse Rate:  [76-140] 107 (01/11 0800) Resp:  [15-29] 17 (01/11 0800) BP: (72-151)/(43-83) 106/67 mmHg (01/11 0800) SpO2:  [87 %-100 %] 93 % (01/11 0800) FiO2 (%):  [50 %-100 %] 60 % (01/11 0333) Weight:  [82.8 kg (182 lb 8.7 oz)] 82.8 kg (182 lb 8.7 oz) (01/11 0400) Last BM Date:  (pta)  Intake/Output from previous day: 01/10 0701 - 01/11 0700 In: 3670.1 [P.O.:180; I.V.:2280.1; NG/GT:60; IV Piggyback:1150] Out: 1200 [Urine:1200] Intake/Output this shift: Total I/O In: 135.2 [I.V.:135.2] Out: 205 [Urine:205]  Intubated and sedated Subcutaneous emphysema of left chest Lungs with occ crackle CV mildly tachy Abdomen soft  Lab Results:   Recent Labs  03/09/15 0410 03/10/15 0348  WBC 8.9 5.3  HGB 13.1 11.6*  HCT 42.2 38.2*  PLT 106* 100*   BMET  Recent Labs  03/09/15 0410 03/10/15 0348  NA 144 147*  K 4.5 3.9  CL 112* 117*  CO2 28 24  GLUCOSE 122* 83  BUN 17 27*  CREATININE 0.80 0.85  CALCIUM 8.2* 7.8*   PT/INR  Recent Labs  03/08/15 1353  LABPROT 17.7*  INR 1.45   ABG  Recent Labs  03/09/15 1413 03/09/15 1550  PHART 7.240* 7.282*  HCO3 26.0* 25.5*    Studies/Results: Dg Abd 1 View  03/09/2015  CLINICAL DATA:  Nasogastric tube placement. EXAM: ABDOMEN - 1 VIEW COMPARISON:  None. FINDINGS: A feeding tube is identified traversing the stomach and duodenum with the tip near the expected position of the ligament of Treitz. A separate decompressive tube is not seen. There are multiple leads overlapping the region of the lower chest and it is unclear whether there may be a separate to in the distal esophagus. IMPRESSION: Feeding tube extends to the level of the ligament of Treitz. Separate gastric decompression tube is difficult to visualize, if present. If there  is a separate tube present, recommend tube advancement and repeat of a film centered slightly higher to include more of the lower chest. Removal/ movement of monitoring lead wires would also be helpful. Electronically Signed   By: Irish LackGlenn  Yamagata M.D.   On: 03/09/2015 20:05   Dg Chest Port 1 View  03/10/2015  CLINICAL DATA:  60 year old male status post endotracheal tube placement. EXAM: PORTABLE CHEST 1 VIEW COMPARISON:  Chest radiograph dated 03/09/2015 FINDINGS: An endotracheal tube is seen with tip approximately 5 cm above the carina. And enteric tube extends into the left hemi abdomen with tip beyond the image cut off. A focal area of airspace opacity is noted in the right mid lung field. There is emphysematous changes of the lungs. No pleural effusion or pneumothorax. There is irregularity of the left posterior sixth rib concerning for a fracture. Clinical correlation is recommended. Left lateral chest wall soft tissue emphysema. IMPRESSION: Endotracheal tube above the carina. Emphysema with focal right mid lung field airspace opacity. Focal discontinuity of the left posterior sixth rib concerning for fracture. Clinical correlation is recommended. Left chest wall soft tissue emphysema. No pneumothorax. Electronically Signed   By: Elgie CollardArash  Radparvar M.D.   On: 03/10/2015 06:20   Portable Chest Xray  03/09/2015  CLINICAL DATA:  Tachycardia, decreased oxygenation EXAM: PORTABLE CHEST 1 VIEW COMPARISON:  03/09/2015 FINDINGS: There is increasing right upper lobe consolidation. There is right lower lobe airspace disease. There is left  lung interstitial thickening. There is an endotracheal tube with the tip 4.1 cm above the carina. There is no pleural effusion. There is a tiny left apical pneumothorax. Soft tissue emphysema in the neck soft tissues. The heart and mediastinal contours are unremarkable. The osseous structures are unremarkable. IMPRESSION: 1. Increased right upper lobe consolidation and worsening  right lower lobe airspace disease concerning for multilobar pneumonia. 2. Endotracheal tube with the tip 4.1 cm above the carina. 3. Tiny residual left apical pneumothorax. Subcutaneous emphysema in the neck soft tissues. Electronically Signed   By: Elige Ko   On: 03/09/2015 13:22   Dg Chest Port 1 View  03/09/2015  CLINICAL DATA:  Pneumothorax. EXAM: PORTABLE CHEST 1 VIEW COMPARISON:  03/08/2015 . FINDINGS: Mediastinum and hilar structures are stable. Stable cardiomegaly. No pulmonary venous congestion . Progressive right upper lobe infiltrate. Bibasilar atelectasis and/or mild infiltrates. Previously identified left apical pneumothorax is less well identified, mild residual cannot be excluded. Left chest wall subcutaneous emphysema again noted. Left rib fractures again noted. IMPRESSION: 1. Small left apical pneumothorax is less well identified on today's exam, mild residual cannot be excluded . 2. Progressive dense right upper lobe infiltrate. Persistent bibasilar atelectasis and/or mild infiltrates. 3. Subcutaneous emphysema left chest wall. Multiple left rib fractures again noted. 4. Stable cardiomegaly.  No pulmonary venous congestion. Electronically Signed   By: Maisie Fus  Register   On: 03/09/2015 07:45   Dg Chest Port 1 View  03/08/2015  CLINICAL DATA:  Pneumothorax EXAM: PORTABLE CHEST 1 VIEW COMPARISON:  03/07/2015 FINDINGS: 5% left apical pneumothorax is slightly larger. Emphysema over the left chest wall is stable. Stable appearance of the lungs and heart. IMPRESSION: Left pneumothorax has slightly enlarged but remains small at 5%. Electronically Signed   By: Jolaine Click M.D.   On: 03/08/2015 12:18    Anti-infectives: Anti-infectives    Start     Dose/Rate Route Frequency Ordered Stop   03/09/15 1800  ampicillin-sulbactam (UNASYN) 1.5 g in sodium chloride 0.9 % 50 mL IVPB     1.5 g 100 mL/hr over 30 Minutes Intravenous Every 6 hours 03/09/15 1725        Assessment/Plan: s/p * No  surgery found *  Fall on ice Multiple left rib fx (5,6; 7-10)- now intubated.  CXR this morning with no PTX, still with subcut emphysema Left pneumothorax - PTX small and stable Subcutaneous emphysema left chest wall Heavy tobacco use COPD - intubated, CCM consulting  Cirrhosis with splenomegaly/portal HTN - INR ok History of right rib fx & pneumothorax spontaneous treated with chest tube >10 years ago History of laparotomy H/o chronic back pain with old fx T5 & T9, TP fx T8 & T6 Incidental 5mm Right upper lobe nodule - OP f/u Incidental 16mm Left adrenal nodule - OP f/u Incidental 17mm liver - 2 masses Incidental AAA 4.3 x 4.1 Chronic listhesis of neck - c-spine cleared ABL anemia - mild, monitor VTE - SCD's, Lovenox  FEN - HH diet when able to eat Will check EKG and troponins Change IVF to add glucose   LOS: 3 days    Benjamin Bell A 03/10/2015

## 2015-03-10 NOTE — Progress Notes (Signed)
Hypoglycemic Event  CBG: 58  Treatment: D50 IV 25 mL  Symptoms: None  Follow-up CBG: Time:0900 CBG Result:85  Possible Reasons for Event: Inadequate meal intake  Comments/MD notified: Dr. Magnus IvanBlackman was notified    PINEDA, Selinda FlavinYENI E

## 2015-03-11 ENCOUNTER — Inpatient Hospital Stay (HOSPITAL_COMMUNITY): Payer: Medicaid Other

## 2015-03-11 LAB — BLOOD GAS, ARTERIAL
ACID-BASE EXCESS: 1.2 mmol/L (ref 0.0–2.0)
Bicarbonate: 26.9 mEq/L — ABNORMAL HIGH (ref 20.0–24.0)
DRAWN BY: 40415
FIO2: 0.6
MECHVT: 600 mL
O2 Saturation: 91.5 %
PEEP/CPAP: 5 cmH2O
PH ART: 7.304 — AB (ref 7.350–7.450)
Patient temperature: 98.6
RATE: 18 resp/min
TCO2: 28.6 mmol/L (ref 0–100)
pCO2 arterial: 55.8 mmHg — ABNORMAL HIGH (ref 35.0–45.0)
pO2, Arterial: 67 mmHg — ABNORMAL LOW (ref 80.0–100.0)

## 2015-03-11 LAB — CBC
HCT: 37.5 % — ABNORMAL LOW (ref 39.0–52.0)
Hemoglobin: 11.6 g/dL — ABNORMAL LOW (ref 13.0–17.0)
MCH: 30.4 pg (ref 26.0–34.0)
MCHC: 30.9 g/dL (ref 30.0–36.0)
MCV: 98.2 fL (ref 78.0–100.0)
PLATELETS: 119 10*3/uL — AB (ref 150–400)
RBC: 3.82 MIL/uL — AB (ref 4.22–5.81)
RDW: 16.9 % — ABNORMAL HIGH (ref 11.5–15.5)
WBC: 4.9 10*3/uL (ref 4.0–10.5)

## 2015-03-11 LAB — COMPREHENSIVE METABOLIC PANEL
ALBUMIN: 1.6 g/dL — AB (ref 3.5–5.0)
ALT: 26 U/L (ref 17–63)
ANION GAP: 6 (ref 5–15)
AST: 71 U/L — ABNORMAL HIGH (ref 15–41)
Alkaline Phosphatase: 97 U/L (ref 38–126)
BILIRUBIN TOTAL: 0.6 mg/dL (ref 0.3–1.2)
BUN: 27 mg/dL — ABNORMAL HIGH (ref 6–20)
CALCIUM: 7.9 mg/dL — AB (ref 8.9–10.3)
CHLORIDE: 118 mmol/L — AB (ref 101–111)
CO2: 26 mmol/L (ref 22–32)
Creatinine, Ser: 0.77 mg/dL (ref 0.61–1.24)
GFR calc Af Amer: >60 mL/min (ref >60)
GFR calc non Af Amer: >60 mL/min (ref >60)
GLUCOSE: 103 mg/dL — AB (ref 65–99)
Potassium: 4.2 mmol/L (ref 3.5–5.1)
Sodium: 150 mmol/L — ABNORMAL HIGH (ref 135–145)
Total Protein: 4.8 g/dL — ABNORMAL LOW (ref 6.5–8.1)

## 2015-03-11 LAB — GLUCOSE, CAPILLARY
GLUCOSE-CAPILLARY: 82 mg/dL (ref 65–99)
GLUCOSE-CAPILLARY: 124 mg/dL — AB (ref 65–99)
GLUCOSE-CAPILLARY: 112 mg/dL — AB (ref 65–99)
GLUCOSE-CAPILLARY: 105 mg/dL — AB (ref 65–99)
GLUCOSE-CAPILLARY: 132 mg/dL — AB (ref 65–99)

## 2015-03-11 LAB — PHOSPHORUS: PHOSPHORUS: 1.3 mg/dL — AB (ref 2.5–4.6)

## 2015-03-11 LAB — MAGNESIUM: Magnesium: 2 mg/dL (ref 1.7–2.4)

## 2015-03-11 MED ORDER — ANTISEPTIC ORAL RINSE SOLUTION (CORINZ)
7.0000 mL | OROMUCOSAL | Status: DC
  Administered 2015-03-11 – 2015-03-15 (×45): 7 mL via OROMUCOSAL

## 2015-03-11 MED ORDER — METOPROLOL TARTRATE 25 MG/10 ML ORAL SUSPENSION
25.0000 mg | Freq: Two times a day (BID) | ORAL | Status: DC
  Administered 2015-03-11 – 2015-03-14 (×7): 25 mg
  Filled 2015-03-11 (×8): qty 10

## 2015-03-11 MED ORDER — NICOTINE 14 MG/24HR TD PT24
14.0000 mg | MEDICATED_PATCH | Freq: Every day | TRANSDERMAL | Status: DC
  Administered 2015-03-12: 14 mg via TRANSDERMAL
  Filled 2015-03-11 (×3): qty 1

## 2015-03-11 MED ORDER — FREE WATER
200.0000 mL | Freq: Three times a day (TID) | Status: DC
  Administered 2015-03-11 – 2015-03-12 (×4): 200 mL

## 2015-03-11 MED ORDER — ACETAMINOPHEN 160 MG/5ML PO SOLN
650.0000 mg | Freq: Four times a day (QID) | ORAL | Status: DC | PRN
  Administered 2015-03-12: 650 mg via ORAL
  Filled 2015-03-11: qty 20.3

## 2015-03-11 MED ORDER — SODIUM CHLORIDE 0.9 % IV SOLN
25.0000 ug/h | INTRAVENOUS | Status: DC
  Administered 2015-03-11: 50 ug/h via INTRAVENOUS
  Administered 2015-03-13 – 2015-03-14 (×2): 40 ug/h via INTRAVENOUS
  Filled 2015-03-11 (×3): qty 50

## 2015-03-11 MED ORDER — FREE WATER
100.0000 mL | Status: DC
  Administered 2015-03-11: 100 mL

## 2015-03-11 MED ORDER — DEXTROSE 5 % IV SOLN
INTRAVENOUS | Status: DC
  Administered 2015-03-11 – 2015-03-18 (×11): via INTRAVENOUS

## 2015-03-11 MED ORDER — FENTANYL BOLUS VIA INFUSION
50.0000 ug | INTRAVENOUS | Status: DC | PRN
  Filled 2015-03-11: qty 50

## 2015-03-11 NOTE — Progress Notes (Signed)
eLink Physician-Brief Progress Note Patient Name: Benjamin Bell DOB: 10-14-1955 MRN: [id number]   Date of Service  03/11/2015  HPI/Events of Note  CXR reviewed  eICU Interventions  Good Vt, ETT 4cm above carina. Adequate positioning.      Intervention Category Major Interventions: Respiratory failure - evaluation and management  [name] 03/11/2015, 6:43 AM

## 2015-03-11 NOTE — Progress Notes (Signed)
[facility] Progress Note     Subjective: Pt intubated and sedated. Overnight pt had episodes of tachycardia in the 150's, agitation, and decreased Vt. After suspecting possible tube migration, the pts ET tube was advanced from 22cm to 24 cm at the lip and his symptoms decreased.   Objective: Vital signs in last 24 hours: Temp:  [97.2 F (36.2 C)-99.1 F (37.3 C)] 98.6 F (37 C) (01/12 0400) Pulse Rate:  [90-120] 109 (01/12 0900) Resp:  [12-30] 30 (01/12 0900) BP: (95-136)/(54-85) 135/85 mmHg (01/12 0901) SpO2:  [88 %-96 %] 91 % (01/12 0900) FiO2 (%):  [50 %-60 %] 60 % (01/12 0800) Weight:  [86.4 kg (190 lb 7.6 oz)] 86.4 kg (190 lb 7.6 oz) (01/12 0417) Last BM Date:  (pta)  Vent: Pressure Control 03/11/15 PEEP: 5 cmH2O FiO2: 60% Pressure: 25 cm  Intake/Output from previous day: 01/11 0701 - 01/12 0700 In: 3358.1 [I.V.:1492.1; NG/GT:766; IV Piggyback:1100] Out: 1715 [Urine:1715] Intake/Output this shift: Total I/O In: 308.3 [I.V.:118.3; NG/GT:190] Out: -   PE: Gen:  Intubated, sedated Card:  RRR, no M/G/R heard Pulm:  Coarse breath sounds  Abd: Soft, NT/ND, +BS, no HSM   Ext:  No erythema, edema, or tenderness, pedal pulses palpable  Lab Results:   Recent Labs  03/10/15 0348 03/11/15 0311  WBC 5.3 4.9  HGB 11.6* 11.6*  HCT 38.2* 37.5*  PLT 100* 119*   BMET  Recent Labs  03/10/15 0348 03/11/15 0043  NA 147* 150*  K 3.9 4.2  CL 117* 118*  CO2 24 26  GLUCOSE 83 103*  BUN 27* 27*  CREATININE 0.85 0.77  CALCIUM 7.8* 7.9*   PT/INR  Recent Labs  03/08/15 1353  LABPROT 17.7*  INR 1.45   CMP     Component Value Date/Time   NA 150* 03/11/2015 0043   K 4.2 03/11/2015 0043   CL 118* 03/11/2015 0043   CO2 26 03/11/2015 0043   GLUCOSE 103* 03/11/2015 0043   BUN 27* 03/11/2015 0043   CREATININE 0.77 03/11/2015 0043   CALCIUM 7.9* 03/11/2015 0043   PROT 4.8* 03/11/2015 0043   ALBUMIN 1.6* 03/11/2015 0043   AST 71* 03/11/2015 0043    ALT 26 03/11/2015 0043   ALKPHOS 97 03/11/2015 0043   BILITOT 0.6 03/11/2015 0043   GFRNONAA >60 03/11/2015 0043   GFRAA >60 03/11/2015 0043   Lipase     Component Value Date/Time   LIPASE 39 03/07/2015 0321    Studies/Results: Dg Abd 1 View  03/09/2015  CLINICAL DATA:  Nasogastric tube placement. EXAM: ABDOMEN - 1 VIEW COMPARISON:  None. FINDINGS: A feeding tube is identified traversing the stomach and duodenum with the tip near the expected position of the ligament of Treitz. A separate decompressive tube is not seen. There are multiple leads overlapping the region of the lower chest and it is unclear whether there may be a separate to in the distal esophagus. IMPRESSION: Feeding tube extends to the level of the ligament of Treitz. Separate gastric decompression tube is difficult to visualize, if present. If there is a separate tube present, recommend tube advancement and repeat of a film centered slightly higher to include more of the lower chest. Removal/ movement of monitoring lead wires would also be helpful. Electronically Signed   By: Irish Lack M.D.   On: 03/09/2015 20:05   Dg Chest Port 1 View  03/11/2015  CLINICAL DATA:  Acute respiratory failure, pneumothorax, left rib fractures EXAM: PORTABLE CHEST 1 VIEW COMPARISON:  Portable chest x-ray dated March 10, 2015 FINDINGS: The lungs are adequately inflated. Confluent interstitial and alveolar infiltrate in the right upper lobe persists. The interstitial markings in both lower lobes are coarse but stable. There is extensive subcutaneous emphysema on the left. A discrete pleural line of a pneumothorax is not observed on the left. The heart is normal in size. The pulmonary vascularity is not engorged. A tiny left pleural effusion is suspected. The endotracheal tube tip lies 4.6 cm above the carina. The feeding tube tip projects below the inferior margin of the image. IMPRESSION: Fairly stable appearance of the chest. An occult  left-sided pneumothorax is likely present but not clearly demonstrated radiographically. A large amount of subcutaneous emphysema is present. Persistent pneumonia in the right upper lobe. The support tubes are in reasonable position. Electronically Signed   By: David  [place] M.D.   On: 03/11/2015 07:43   Dg Chest Port 1 View  03/10/2015  CLINICAL DATA:  60 year old male status post endotracheal tube placement. EXAM: PORTABLE CHEST 1 VIEW COMPARISON:  Chest radiograph dated 03/09/2015 FINDINGS: An endotracheal tube is seen with tip approximately 5 cm above the carina. And enteric tube extends into the left hemi abdomen with tip beyond the image cut off. A focal area of airspace opacity is noted in the right mid lung field. There is emphysematous changes of the lungs. No pleural effusion or pneumothorax. There is irregularity of the left posterior sixth rib concerning for a fracture. Clinical correlation is recommended. Left lateral chest wall soft tissue emphysema. IMPRESSION: Endotracheal tube above the carina. Emphysema with focal right mid lung field airspace opacity. Focal discontinuity of the left posterior sixth rib concerning for fracture. Clinical correlation is recommended. Left chest wall soft tissue emphysema. No pneumothorax. Electronically Signed   By: Elgie Collard M.D.   On: 03/10/2015 06:20   Portable Chest Xray  03/09/2015  CLINICAL DATA:  Tachycardia, decreased oxygenation EXAM: PORTABLE CHEST 1 VIEW COMPARISON:  03/09/2015 FINDINGS: There is increasing right upper lobe consolidation. There is right lower lobe airspace disease. There is left lung interstitial thickening. There is an endotracheal tube with the tip 4.1 cm above the carina. There is no pleural effusion. There is a tiny left apical pneumothorax. Soft tissue emphysema in the neck soft tissues. The heart and mediastinal contours are unremarkable. The osseous structures are unremarkable. IMPRESSION: 1. Increased right upper lobe  consolidation and worsening right lower lobe airspace disease concerning for multilobar pneumonia. 2. Endotracheal tube with the tip 4.1 cm above the carina. 3. Tiny residual left apical pneumothorax. Subcutaneous emphysema in the neck soft tissues. Electronically Signed   By: Elige Ko   On: 03/09/2015 13:22    Anti-infectives: Anti-infectives    Start     Dose/Rate Route Frequency Ordered Stop   03/10/15 2000  vancomycin (VANCOCIN) IVPB 1000 mg/200 mL premix     1,000 mg 200 mL/hr over 60 Minutes Intravenous Every 8 hours 03/10/15 1100     03/10/15 1100  vancomycin (VANCOCIN) 1,750 mg in sodium chloride 0.9 % 500 mL IVPB     1,750 mg 250 mL/hr over 120 Minutes Intravenous  Once 03/10/15 1054 03/10/15 1536   03/09/15 1800  ampicillin-sulbactam (UNASYN) 1.5 g in sodium chloride 0.9 % 50 mL IVPB     1.5 g 100 mL/hr over 30 Minutes Intravenous Every 6 hours 03/09/15 1725         Assessment/Plan Fall Multiple Rib Fractures- pulm toilet PTX left - less than 5%, observe  Acute Hypoxic Respiratory Failure - per CCM, on vent support Right Upper Lobe PNA - Unasyn Day #3, Vancomycin Day #2 Hypernatremia- increased free water, follow-up CBC pending  Hyperphosphatemia Hypoglycemia Acute encephalopathy - on chronic methadone OP; per medicine  Tobacco Abuse HTN - per CCM  GI proph- Prontonic VTE- lovenox and SCDs FEN - tube feeds    LOS: 4 days   Hosie SpangleSimaan, Fayola Meckes PA Student 03/11/2015, 10:25 AM Pager: [phone]

## 2015-03-11 NOTE — Care Management Note (Signed)
Case Management Note  Patient Details  Name: Benjamin Bell MRN: [id number] Date of Birth: 1955-07-09  Subjective/Objective:   Pt admitted on 03/07/15 s/p fall while walking his dog.  He suffered multiple rib fx, PTX, respiratory failure.  PTA, pt independent, lived with spouse.                   Action/Plan: Pt remains sedated and on ventilator currently.  Will follow for discharge planning as pt progresses.    Expected Discharge Date:                  Expected Discharge Plan:     In-House Referral:   CM Referral  Discharge planning Services     Post Acute Care Choice:    Choice offered to:     DME Arranged:    DME Agency:     HH Arranged:    HH Agency:     Status of Service:   In process, will continue to follow  Medicare Important Message Given:    Date Medicare IM Given:    Medicare IM give by:    Date Additional Medicare IM Given:    Additional Medicare Important Message give by:     If discussed at Long Length of Stay Meetings, dates discussed:    Additional Comments:  Quintella BatonJulie W. Clois Treanor, RN, BSN  Trauma/Neuro ICU Case Manager [phone]

## 2015-03-11 NOTE — Progress Notes (Signed)
Pt HR jump up to 150bpm with frequent PAC's, nonsustained. Pt HR comes back down to 105-110bpm again. Pt repeats cardiac trend. Per report, Trauma MD notified on dayshift. Dr. Dema SeverinMungal notified of this event and per report. Orders received, will continue to monitor.

## 2015-03-11 NOTE — Progress Notes (Signed)
eLink Physician-Brief Progress Note Patient Name: Benjamin MullJames Bell DOB: 01-09-1956 MRN: [id number]   Date of Service  03/11/2015  HPI/Events of Note  CMP reviewed, Na=150  eICU Interventions  Inc Free H20 to 100cc q4hrs for the next 12 hours.      Intervention Category Major Interventions: Other:  Benjamin Bell 03/11/2015, 2:36 AM

## 2015-03-11 NOTE — Progress Notes (Signed)
RT called to room by RN stating that the patient had pulled the tube out.  Upon entering the room RT noticed that ETT was at 22 cm.  RT deflated cuff and advanced the tube to 23 cm at the lip; ETCO2 detector had color change and CXR to confirm the tube placement.

## 2015-03-11 NOTE — Progress Notes (Signed)
eLink Physician-Brief Progress Note Patient Name: Benjamin MullJames Bell DOB: 1955/11/13 MRN: [id number]   Date of Service  03/11/2015  HPI/Events of Note  Patient with short runs of tachycardia to the 150s  eICU Interventions  Check CMP/Mg/Phos     Intervention Category Intermediate Interventions: Other:  [name] 03/11/2015, 12:23 AM

## 2015-03-11 NOTE — Progress Notes (Addendum)
PULMONARY / CRITICAL CARE MEDICINE   Name: Benjamin Bell MRN: [id number] DOB: September 13, 1955    ADMISSION DATE:  03/07/2015 CONSULTATION DATE:  03/09/2015  REFERRING MD:  Trauma  CHIEF COMPLAINT:  Fall  SUBJECTIVE:  Episodes of tachycardia overnight.  VITAL SIGNS: BP 135/85 mmHg  Pulse 114  Temp(Src) 98.6 F (37 C) (Axillary)  Resp 19  Ht 5\' 11"  (1.803 m)  Wt 190 lb 7.6 oz (86.4 kg)  BMI 26.58 kg/m2  SpO2 93%  VENTILATOR SETTINGS: Vent Mode:  [-] PRVC FiO2 (%):  [50 %-60 %] 60 % Set Rate:  [18 bmp] 18 bmp Vt Set:  [600 mL] 600 mL PEEP:  [5 cmH20] 5 cmH20 Plateau Pressure:  [14 cmH20-24 cmH20] 18 cmH20  INTAKE / OUTPUT: I/O last 3 completed shifts: In: 6007.3 [I.V.:2931.3; NG/GT:826; IV Piggyback:2250] Out: 2615 [Urine:2615]  PHYSICAL EXAMINATION: General:  sedated Neuro:  RASS -1 HEENT:  ETT in place Cardiovascular:  irregular, tachycardic Lungs:  No wheeze Abdomen:  soft Musculoskeletal:  No edema Skin:  No rashes  LABS:  BMET  Recent Labs Lab 03/09/15 0410 03/10/15 0348 03/11/15 0043  NA 144 147* 150*  K 4.5 3.9 4.2  CL 112* 117* 118*  CO2 28 24 26   BUN 17 27* 27*  CREATININE 0.80 0.85 0.77  GLUCOSE 122* 83 103*    Electrolytes  Recent Labs Lab 03/09/15 0410 03/10/15 0348 03/11/15 0043  CALCIUM 8.2* 7.8* 7.9*  MG  --  2.0 2.0  PHOS  --  1.6* 1.3*    CBC  Recent Labs Lab 03/09/15 0410 03/10/15 0348 03/11/15 0311  WBC 8.9 5.3 4.9  HGB 13.1 11.6* 11.6*  HCT 42.2 38.2* 37.5*  PLT 106* 100* 119*    Coag's  Recent Labs Lab 03/08/15 1353  INR 1.45    Sepsis Markers  Recent Labs Lab 03/07/15 0328  LATICACIDVEN 2.06*    ABG  Recent Labs Lab 03/09/15 1413 03/09/15 1550 03/11/15 0410  PHART 7.240* 7.282* 7.304*  PCO2ART 63.0* 55.8* 55.8*  PO2ART 56.8* 149* 67.0*    Liver Enzymes  Recent Labs Lab 03/07/15 0516 03/10/15 0348 03/11/15 0043  AST 67* 60* 71*  ALT 28 23 26   ALKPHOS 156* 82 97  BILITOT 0.8 0.6  0.6  ALBUMIN 2.2* 1.6* 1.6*    Cardiac Enzymes  Recent Labs Lab 03/10/15 1037 03/10/15 1437 03/10/15 2050  TROPONINI <0.03 <0.03 <0.03    Glucose  Recent Labs Lab 03/10/15 1247 03/10/15 1736 03/10/15 1805 03/10/15 1944 03/10/15 2321 03/11/15 0327  GLUCAP 77 68 106* 87 98 82    Imaging Dg Chest Port 1 View  03/11/2015  CLINICAL DATA:  Acute respiratory failure, pneumothorax, left rib fractures EXAM: PORTABLE CHEST 1 VIEW COMPARISON:  Portable chest x-ray dated March 10, 2015 FINDINGS: The lungs are adequately inflated. Confluent interstitial and alveolar infiltrate in the right upper lobe persists. The interstitial markings in both lower lobes are coarse but stable. There is extensive subcutaneous emphysema on the left. A discrete pleural line of a pneumothorax is not observed on the left. The heart is normal in size. The pulmonary vascularity is not engorged. A tiny left pleural effusion is suspected. The endotracheal tube tip lies 4.6 cm above the carina. The feeding tube tip projects below the inferior margin of the image. IMPRESSION: Fairly stable appearance of the chest. An occult left-sided pneumothorax is likely present but not clearly demonstrated radiographically. A large amount of subcutaneous emphysema is present. Persistent pneumonia in the right upper lobe. The  support tubes are in reasonable position. Electronically Signed   By: David  [place] M.D.   On: 03/11/2015 07:43     STUDIES:   CULTURES: 1/10 Blood >> GPC >> 1/10 Sputum >>   ANTIBIOTICS: 1/10 Unasyn >> 1/11 Vancomycin >>   SIGNIFICANT EVENTS: 1/08 Fall 1/10 VDRF 1/12 Change to pressure control  LINES/TUBES: 1/10 ETT >>   DISCUSSION: 60 yo male smoker fell and had Lt rib fx with Lt PTX.  Developed progressive respiratory failure and required intubation.  ASSESSMENT / PLAN:  PULMONARY A: Acute hypoxic respiratory failure. Tobacco abuse. P:   Full vent support >> change to pressure  control on 1/12 F/u CXR, ABG Scheduled BDs  CARDIOVASCULAR A:  Hx of HTN. MAT. P:  Continue norvasc Add lopressor 25 mg bid on 1/12  RENAL A:   Hypernatremia. Hypophosphatemia. P:   F/u BMET Change IV fluid to D5W Add free water   GASTROINTESTINAL A:   Nutrition. P:   Tube feeds while on vent Protonix for SUP  HEMATOLOGIC A:   Thrombocytopenia. P:  F/u CBC Lovenox for DVT prevention  INFECTIOUS A:   Possible aspiration pneumonia. GPC in blood cx. P:   Day 3 unasyn, Day 1 vancomycin  ENDOCRINE A:   Hypoglycemia. P:   Monitor blood sugar   NEUROLOGIC A:   Acute encephalopathy. Chronic pain >> he is on chronic methadone as outpt P:   RASS goal: -1 Diprivan and fentanyl gtt's  Updated pt's wife.  CC time 32 minutes.  Coralyn Helling, MD [facility] Pulmonary/Critical Care 03/11/2015, 9:21 AM Pager:  [phone] After 3pm call: [phone]

## 2015-03-11 NOTE — Progress Notes (Signed)
Bathing pt. Pt coughing a lot. A lot of secretions noted while suctioning. Pt continues to cough. Pt repositioned. Noted pt distress, ETTube 22cm at lip, and pt not pulling tidal volumes. RT called. Pt auscultated. Unable to auscultate Ventilator. RT in room, assessing pt. [facility] notified. Dr. Dema SeverinMungal camera in room. RT advance tube to 24cm at lip. Noted improvement in pt volumes. Stat CXR ordered. Dr. Dema SeverinMungal still camera into room, notified CXR was completed.  New tube holder place by RTx2.  Will continue to monitor closely.

## 2015-03-11 NOTE — Progress Notes (Signed)
eLink Physician-Brief Progress Note Patient Name: Corky MullJames Norgard DOB: 1955/06/16 MRN: [id number]   Date of Service  03/11/2015  HPI/Events of Note  Low tidal volumes, agitation, possible tube migration  eICU Interventions  Tube advanced to 24cm at the lip, Vt increased, check stat CXR     Intervention Category Major Interventions: Respiratory failure - evaluation and management  Heavan Francom 03/11/2015, 6:23 AM

## 2015-03-12 ENCOUNTER — Inpatient Hospital Stay (HOSPITAL_COMMUNITY): Payer: Medicaid Other

## 2015-03-12 DIAGNOSIS — J189 Pneumonia, unspecified organism: Secondary | ICD-10-CM | POA: Diagnosis not present

## 2015-03-12 DIAGNOSIS — J13 Pneumonia due to Streptococcus pneumoniae: Secondary | ICD-10-CM

## 2015-03-12 DIAGNOSIS — E870 Hyperosmolality and hypernatremia: Secondary | ICD-10-CM | POA: Diagnosis not present

## 2015-03-12 LAB — CULTURE, RESPIRATORY W GRAM STAIN: Special Requests: NORMAL

## 2015-03-12 LAB — GLUCOSE, CAPILLARY
GLUCOSE-CAPILLARY: 113 mg/dL — AB (ref 65–99)
GLUCOSE-CAPILLARY: 107 mg/dL — AB (ref 65–99)
GLUCOSE-CAPILLARY: 136 mg/dL — AB (ref 65–99)
Glucose-Capillary: 113 mg/dL — ABNORMAL HIGH (ref 65–99)
Glucose-Capillary: 95 mg/dL (ref 65–99)
Glucose-Capillary: 103 mg/dL — ABNORMAL HIGH (ref 65–99)

## 2015-03-12 LAB — POCT I-STAT 3, ART BLOOD GAS (G3+)
Acid-Base Excess: 4 mmol/L — ABNORMAL HIGH (ref 0.0–2.0)
BICARBONATE: 29.3 meq/L — AB (ref 20.0–24.0)
O2 Saturation: 93 %
PCO2 ART: 49.2 mmHg — AB (ref 35.0–45.0)
TCO2: 31 mmol/L (ref 0–100)
pH, Arterial: 7.388 (ref 7.350–7.450)
pO2, Arterial: 71 mmHg — ABNORMAL LOW (ref 80.0–100.0)

## 2015-03-12 LAB — BASIC METABOLIC PANEL
Anion gap: 5 (ref 5–15)
BUN: 32 mg/dL — AB (ref 6–20)
CALCIUM: 7.7 mg/dL — AB (ref 8.9–10.3)
CO2: 29 mmol/L (ref 22–32)
CREATININE: 0.9 mg/dL (ref 0.61–1.24)
Chloride: 117 mmol/L — ABNORMAL HIGH (ref 101–111)
GFR calc Af Amer: >60 mL/min (ref >60)
GLUCOSE: 117 mg/dL — AB (ref 65–99)
Potassium: 4.5 mmol/L (ref 3.5–5.1)
SODIUM: 151 mmol/L — AB (ref 135–145)

## 2015-03-12 LAB — CULTURE, BLOOD (ROUTINE X 2)

## 2015-03-12 LAB — CBC
HCT: 39.3 % (ref 39.0–52.0)
HEMOGLOBIN: 12.4 g/dL — AB (ref 13.0–17.0)
MCH: 30.8 pg (ref 26.0–34.0)
MCHC: 31.6 g/dL (ref 30.0–36.0)
MCV: 97.5 fL (ref 78.0–100.0)
PLATELETS: 159 10*3/uL (ref 150–400)
RBC: 4.03 MIL/uL — AB (ref 4.22–5.81)
RDW: 17 % — ABNORMAL HIGH (ref 11.5–15.5)
WBC: 7.6 10*3/uL (ref 4.0–10.5)

## 2015-03-12 LAB — CULTURE, RESPIRATORY

## 2015-03-12 LAB — PHOSPHORUS: Phosphorus: 1.2 mg/dL — ABNORMAL LOW (ref 2.5–4.6)

## 2015-03-12 LAB — MAGNESIUM: MAGNESIUM: 2 mg/dL (ref 1.7–2.4)

## 2015-03-12 LAB — TRIGLYCERIDES: Triglycerides: 204 mg/dL — ABNORMAL HIGH (ref <150)

## 2015-03-12 MED ORDER — FREE WATER
200.0000 mL | Status: DC
  Administered 2015-03-12 – 2015-03-15 (×18): 200 mL

## 2015-03-12 MED ORDER — ENOXAPARIN SODIUM 30 MG/0.3ML ~~LOC~~ SOLN
30.0000 mg | Freq: Two times a day (BID) | SUBCUTANEOUS | Status: DC
  Administered 2015-03-12 – 2015-03-16 (×10): 30 mg via SUBCUTANEOUS
  Filled 2015-03-12 (×11): qty 0.3

## 2015-03-12 MED ORDER — FREE WATER: 200.0000 mL | Freq: Four times a day (QID) | Status: DC

## 2015-03-12 MED ORDER — DEXTROSE 5 % IV SOLN
1.0000 g | INTRAVENOUS | Status: DC
  Administered 2015-03-12 – 2015-03-18 (×7): 1 g via INTRAVENOUS
  Filled 2015-03-12 (×9): qty 10

## 2015-03-12 NOTE — Progress Notes (Signed)
Pharmacy Antibiotic Follow-up Note  Benjamin Bell is a 60 y.o. year-old male admitted on 03/07/2015. The patient is currently on day #3 of abx for Strep Pneumo PNA (CAP).  Assessment/Plan: Day #3 of abx for Strep Pneumo PNA. Afebrile, WBC wnl. SCr stable, CrCl ~3195ml/min.  Plan: Stop Unasyn Start ceftriaxone 1g IV Q24 today Monitor clinical picture F/U LOT   Temp (24hrs), Avg:98.8 F (37.1 C), Min:97.2 F (36.2 C), Max:100.7 F (38.2 C)   Recent Labs Lab 03/07/15 0516 03/09/15 0410 03/10/15 0348 03/11/15 0311 03/12/15 0445  WBC 5.5 8.9 5.3 4.9 7.6    Recent Labs Lab 03/07/15 0516 03/09/15 0410 03/10/15 0348 03/11/15 0043 03/12/15 0445  CREATININE 1.03 0.80 0.85 0.77 0.90   Estimated Creatinine Clearance: 95.3 mL/min (by C-G formula based on Cr of 0.9).    No Known Allergies  Antimicrobials this admission: 1/11 Unasyn >> 1/13 1/11 Vancomycin >> 1/12 1/13 Ceftriaxone >>  Microbiology results: MRSA PCR positive 1/10 Blood cx > 1/2 CONS (contaminant) 1/10 Resp cx > Few GPC in pairs and clusters; few GNRs (pending)  Thank you for allowing pharmacy to be a part of this patient's care.  Enzo BiNathan Jefferie Holston, PharmD, BCPS Clinical Pharmacist Pager [phone] 03/12/2015 11:32 AM

## 2015-03-12 NOTE — Progress Notes (Signed)
Patient ID: Benjamin Bell, male   DOB: January 04, 1956, 60 y.o.   MRN: [id number]   LOS: 5 days   Subjective: Sedated, on vent.   Objective: Vital signs in last 24 hours: Temp:  [97.2 F (36.2 C)-100.7 F (38.2 C)] 100.7 F (38.2 C) (01/13 0358) Pulse Rate:  [83-113] 92 (01/13 0700) Resp:  [16-30] 17 (01/13 0700) BP: (89-171)/(55-85) 99/64 mmHg (01/13 0700) SpO2:  [91 %-99 %] 93 % (01/13 0700) FiO2 (%):  [60 %] 60 % (01/13 0600) Weight:  [87.5 kg (192 lb 14.4 oz)] 87.5 kg (192 lb 14.4 oz) (01/13 0429) Last BM Date:  (pta)   VENT: PC/60%/5PEEP/RR16/Pressure 25cm   UOP: 7190ml/h NET: +124600ml/24h TOTAL: +726827ml/admission   Laboratory CBC  Recent Labs  03/11/15 0311 03/12/15 0445  WBC 4.9 7.6  HGB 11.6* 12.4*  HCT 37.5* 39.3  PLT 119* 159   BMET  Recent Labs  03/11/15 0043 03/12/15 0445  NA 150* 151*  K 4.2 4.5  CL 118* 117*  CO2 26 29  GLUCOSE 103* 117*  BUN 27* 32*  CREATININE 0.77 0.90  CALCIUM 7.9* 7.7*   CBG (last 3)   Recent Labs  03/11/15 1933 03/11/15 2308 03/12/15 0315  GLUCAP 132* 113* 113*   ABG    Component Value Date/Time   PHART 7.388 03/12/2015 0502   PCO2ART 49.2* 03/12/2015 0502   PO2ART 71.0* 03/12/2015 0502   HCO3 29.3* 03/12/2015 0502   TCO2 31 03/12/2015 0502   ACIDBASEDEF 0.4 03/09/2015 1550   O2SAT 93.0 03/12/2015 0502    Radiology PORTABLE CHEST 1 VIEW  COMPARISON: 03/11/2015. 03/10/2015. 03/09/2015 .  FINDINGS: Endotracheal tube and feeding tube in stable position. Mediastinum and hilar structures normal. Partial clearing of right upper lobe infiltrate. Right upper lobe atelectatic changes. Low lung volumes with left lower lobe subsegmental atelectasis and or infiltrate. Small left pleural effusion. A subtle miniscule left apical pneumothorax again cannot be completely excluded. Partial clearing of left chest wall subcutaneous emphysema.  IMPRESSION: 1. Lines and tubes in stable position. 2. Persistent but  partially clearing right upper lobe infiltrate. Right upper lobe atelectatic changes are present . Left lower lobe subsegmental atelectasis and/or infiltrate. 3. Small left pleural effusion. 4. Partial clearing of left chest wall subcutaneous emphysema. A subtle miniscule left apical pneumothorax again cannot be completely excluded .   Electronically Signed  By: Maisie Fushomas Register  On: 03/12/2015 08:09   Physical Exam General appearance: no distress Resp: clear to auscultation bilaterally Cardio: regular rate and rhythm GI: normal findings: bowel sounds normal and soft, non-tender Pulses: 2+ and symmetric   Assessment/Plan: Fall Multiple left rib fractures w/PTX - pulm toilet Acute Respiratory Failure - per CCM, on vent support Right Upper Lobe PNA - Unasyn Day #4, Vancomycin Day #3 Hypernatremia -- Slightly increased, will increase free water Acute encephalopathy - on chronic methadone OP; per medicine Tobacco Abuse HTN - per CCM  FEN - Tolerating TF VTE- Lovenox and SCDs Dispo -- ARF    Freeman CaldronMichael J. Keaston Pile, PA-C Pager: [phone] General Trauma PA Pager: [phone]  03/12/2015

## 2015-03-12 NOTE — Progress Notes (Signed)
PULMONARY / CRITICAL CARE MEDICINE   Name: Benjamin Bell MRN: [id number] DOB: 01-Dec-1955    ADMISSION DATE:  03/07/2015 CONSULTATION DATE:  03/09/2015  REFERRING MD:  Trauma  CHIEF COMPLAINT:  Fall  SUBJECTIVE:  HR better.  Oxygenation improving.  VITAL SIGNS: BP 91/61 mmHg  Pulse 102  Temp(Src) 98.7 F (37.1 C) (Axillary)  Resp 16  Ht 5\' 11"  (1.803 m)  Wt 192 lb 14.4 oz (87.5 kg)  BMI 26.92 kg/m2  SpO2 93%  VENTILATOR SETTINGS: Vent Mode:  [-] PCV FiO2 (%):  [50 %-60 %] 50 % Set Rate:  [16 bmp] 16 bmp Vt Set:  [600 mL] 600 mL PEEP:  [5 cmH20] 5 cmH20 Plateau Pressure:  [16 cmH20-27 cmH20] 27 cmH20  INTAKE / OUTPUT: I/O last 3 completed shifts: In: 4455.1 [I.V.:2105.1; NG/GT:1800; IV Piggyback:550] Out: 2250 [Urine:2250]  PHYSICAL EXAMINATION: General:  Sedated on vent Neuro:  RASS -1 HEENT:  ETT in place Cardiovascular:  irregular, tachycardic Lungs:  No wheeze Abdomen:  soft Musculoskeletal:  No edema Skin:  No rashes  LABS:  BMET  Recent Labs Lab 03/10/15 0348 03/11/15 0043 03/12/15 0445  NA 147* 150* 151*  K 3.9 4.2 4.5  CL 117* 118* 117*  CO2 24 26 29   BUN 27* 27* 32*  CREATININE 0.85 0.77 0.90  GLUCOSE 83 103* 117*    Electrolytes  Recent Labs Lab 03/10/15 0348 03/11/15 0043 03/12/15 0445  CALCIUM 7.8* 7.9* 7.7*  MG 2.0 2.0 2.0  PHOS 1.6* 1.3* 1.2*    CBC  Recent Labs Lab 03/10/15 0348 03/11/15 0311 03/12/15 0445  WBC 5.3 4.9 7.6  HGB 11.6* 11.6* 12.4*  HCT 38.2* 37.5* 39.3  PLT 100* 119* 159    Coag's  Recent Labs Lab 03/08/15 1353  INR 1.45    Sepsis Markers  Recent Labs Lab 03/07/15 0328  LATICACIDVEN 2.06*    ABG  Recent Labs Lab 03/09/15 1550 03/11/15 0410 03/12/15 0502  PHART 7.282* 7.304* 7.388  PCO2ART 55.8* 55.8* 49.2*  PO2ART 149* 67.0* 71.0*    Liver Enzymes  Recent Labs Lab 03/07/15 0516 03/10/15 0348 03/11/15 0043  AST 67* 60* 71*  ALT 28 23 26   ALKPHOS 156* 82 97  BILITOT  0.8 0.6 0.6  ALBUMIN 2.2* 1.6* 1.6*    Cardiac Enzymes  Recent Labs Lab 03/10/15 1037 03/10/15 1437 03/10/15 2050  TROPONINI <0.03 <0.03 <0.03    Glucose  Recent Labs Lab 03/11/15 1229 03/11/15 1509 03/11/15 1933 03/11/15 2308 03/12/15 0315 03/12/15 0826  GLUCAP 112* 105* 132* 113* 113* 95    Imaging Dg Chest Port 1 View  03/12/2015  CLINICAL DATA:  Respiratory failure. EXAM: PORTABLE CHEST 1 VIEW COMPARISON:  03/11/2015.  03/10/2015.  03/09/2015 . FINDINGS: Endotracheal tube and feeding tube in stable position. Mediastinum and hilar structures normal. Partial clearing of right upper lobe infiltrate. Right upper lobe atelectatic changes. Low lung volumes with left lower lobe subsegmental atelectasis and or infiltrate. Small left pleural effusion. A subtle miniscule left apical pneumothorax again cannot be completely excluded. Partial clearing of left chest wall subcutaneous emphysema. IMPRESSION: 1. Lines and tubes in stable position. 2. Persistent but partially clearing right upper lobe infiltrate. Right upper lobe atelectatic changes are present . Left lower lobe subsegmental atelectasis and/or infiltrate. 3. Small left pleural effusion. 4. Partial clearing of left chest wall subcutaneous emphysema. A subtle miniscule left apical pneumothorax again cannot be completely excluded . Electronically Signed   By: Maisie Fus  Register   On: 03/12/2015 08:09  STUDIES:   CULTURES: 1/10 Blood >> Coag neg Staph 1/10 Sputum >> STREPTOCOCCUS PNEUMONIAE  ANTIBIOTICS: 1/10 Unasyn >>1/13 1/11 Vancomycin >> 1/12 1/13 Rocephin >>  SIGNIFICANT EVENTS: 1/08 Fall 1/10 VDRF 1/12 Change to pressure control  LINES/TUBES: 1/10 ETT >>   DISCUSSION: 60 yo male smoker fell and had Lt rib fx with Lt PTX.  Developed progressive respiratory failure and required intubation.  ASSESSMENT / PLAN:  PULMONARY A: Acute hypoxic respiratory failure. Tobacco abuse. P:   Full vent support >>  changed to pressure control on 1/12 F/u CXR Scheduled BDs  CARDIOVASCULAR A:  Hx of HTN. MAT. P:  Continue norvasc Added lopressor 25 mg bid on 1/12  RENAL A:   Hypernatremia. Hypophosphatemia. P:   F/u BMET Changed IV fluid to D5W Add free water , increase 1/13  GASTROINTESTINAL A:   Nutrition. P:   Tube feeds while on vent Protonix for SUP  HEMATOLOGIC A:   Thrombocytopenia >> resolved. P:  F/u CBC Lovenox for DVT prevention  INFECTIOUS A:   Possible aspiration pneumonia. Coag neg Staph in blood >> likely contaminate. P:   Day 4 abx, day 1 roc  ENDOCRINE A:   Hypoglycemia. P:   Monitor blood sugar   NEUROLOGIC A:   Acute encephalopathy. Chronic pain >> he is on chronic methadone as outpt P:   RASS goal: -1 Diprivan and fentanyl gtt's  Brett CanalesSteve Minor ACNP [facility] Pager [phone] till 3 pm If no answer page [phone] 03/12/2015, 10:46 AM  Reviewed above, examined.  Agitation improved with addition of fentanyl gtt.  Oxygenation slowing improving.  Better air movement.  HR better.  Abd soft.  CXR with improving infiltrate.  Sodium still up.  Assessment/plan: Acute respiratory failure - continue full vent support  Pneumococcal PNA - change to rocephin  Hypernatremia - continue free water, D5W >> hold diuresis for now  Agitation Chronic pain - continue fentanyl gtt - hold methadone for now  D/w Dr. Lindie SpruceWyatt  CC time by me independent of APP time is 32 minutes.  Coralyn HellingVineet Heliodoro Domagalski, MD [facility] 03/12/2015, 11:16 AM Pager:  [phone] After 3pm call: [phone]

## 2015-03-13 ENCOUNTER — Inpatient Hospital Stay (HOSPITAL_COMMUNITY): Payer: Medicaid Other

## 2015-03-13 LAB — CBC
HEMATOCRIT: 39.2 % (ref 39.0–52.0)
HEMOGLOBIN: 12.4 g/dL — AB (ref 13.0–17.0)
MCH: 31 pg (ref 26.0–34.0)
MCHC: 31.6 g/dL (ref 30.0–36.0)
MCV: 98 fL (ref 78.0–100.0)
Platelets: 130 10*3/uL — ABNORMAL LOW (ref 150–400)
RBC: 4 MIL/uL — AB (ref 4.22–5.81)
RDW: 17.2 % — ABNORMAL HIGH (ref 11.5–15.5)
WBC: 6.5 10*3/uL (ref 4.0–10.5)

## 2015-03-13 LAB — BASIC METABOLIC PANEL
ANION GAP: 5 (ref 5–15)
BUN: 34 mg/dL — ABNORMAL HIGH (ref 6–20)
CALCIUM: 7.3 mg/dL — AB (ref 8.9–10.3)
CO2: 28 mmol/L (ref 22–32)
Chloride: 115 mmol/L — ABNORMAL HIGH (ref 101–111)
Creatinine, Ser: 0.81 mg/dL (ref 0.61–1.24)
GLUCOSE: 124 mg/dL — AB (ref 65–99)
POTASSIUM: 4.7 mmol/L (ref 3.5–5.1)
SODIUM: 148 mmol/L — AB (ref 135–145)

## 2015-03-13 LAB — GLUCOSE, CAPILLARY
GLUCOSE-CAPILLARY: 144 mg/dL — AB (ref 65–99)
GLUCOSE-CAPILLARY: 111 mg/dL — AB (ref 65–99)
GLUCOSE-CAPILLARY: 109 mg/dL — AB (ref 65–99)
Glucose-Capillary: 90 mg/dL (ref 65–99)
Glucose-Capillary: 101 mg/dL — ABNORMAL HIGH (ref 65–99)
Glucose-Capillary: 93 mg/dL (ref 65–99)
Glucose-Capillary: 97 mg/dL (ref 65–99)

## 2015-03-13 LAB — MAGNESIUM: MAGNESIUM: 2.1 mg/dL (ref 1.7–2.4)

## 2015-03-13 LAB — PHOSPHORUS: PHOSPHORUS: 2.1 mg/dL — AB (ref 2.5–4.6)

## 2015-03-13 MED ORDER — SODIUM CHLORIDE 0.9 % IJ SOLN
10.0000 mL | INTRAMUSCULAR | Status: DC | PRN
  Administered 2015-03-19: 10 mL
  Administered 2015-03-20 (×2): 20 mL
  Filled 2015-03-13 (×3): qty 40

## 2015-03-13 MED ORDER — SODIUM CHLORIDE 0.9 % IJ SOLN
10.0000 mL | Freq: Two times a day (BID) | INTRAMUSCULAR | Status: DC
  Administered 2015-03-13: 20 mL
  Administered 2015-03-13: 30 mL
  Administered 2015-03-14: 20 mL
  Administered 2015-03-14 – 2015-03-15 (×2): 10 mL
  Administered 2015-03-15 – 2015-03-16 (×2): 20 mL
  Administered 2015-03-16: 10 mL
  Administered 2015-03-18: 20 mL
  Administered 2015-03-19 – 2015-03-23 (×7): 10 mL

## 2015-03-13 MED ORDER — NICOTINE 7 MG/24HR TD PT24
7.0000 mg | MEDICATED_PATCH | Freq: Every day | TRANSDERMAL | Status: DC
  Administered 2015-03-14 – 2015-03-24 (×11): 7 mg via TRANSDERMAL
  Filled 2015-03-13 (×13): qty 1

## 2015-03-13 NOTE — Progress Notes (Signed)
Wasted 50 ml of fentanyl 2500 mcg in 50 ml with darla godfrey

## 2015-03-13 NOTE — Progress Notes (Signed)
Peripherally Inserted Central Catheter/Midline Placement  The IV Nurse has discussed with the patient and/or persons authorized to consent for the patient, the purpose of this procedure and the potential benefits and risks involved with this procedure.  The benefits include less needle sticks, lab draws from the catheter and patient may be discharged home with the catheter.  Risks include, but not limited to, infection, bleeding, blood clot (thrombus formation), and puncture of an artery; nerve damage and irregular heat beat.  Alternatives to this procedure were also discussed.  Consent signed by wife  PICC/Midline Placement Documentation  PICC Double Lumen 03/13/15 PICC Basilic 40 cm 0 cm (Active)  Indication for Insertion or Continuance of Line Prolonged intravenous therapies 03/13/2015 11:52 AM  Exposed Catheter (cm) 0 cm 03/13/2015 11:52 AM  Lumen #1 Status Flushed;Saline locked;Blood return noted 03/13/2015 11:52 AM  Lumen #2 Status Flushed;Saline locked;Blood return noted 03/13/2015 11:52 AM  Dressing Change Due 03/20/15 03/13/2015 11:52 AM       Ethelda Chickurrie, Josefine Fuhr Robert 03/13/2015, 12:05 PM

## 2015-03-13 NOTE — Progress Notes (Signed)
PULMONARY / CRITICAL CARE MEDICINE   Name: Benjamin Bell MRN: [id number] DOB: January 14, 1956    ADMISSION DATE:  03/07/2015 CONSULTATION DATE:  03/09/2015  REFERRING MD:  Trauma  CHIEF COMPLAINT:  Fall  SUBJECTIVE:  Remains sedated.  VITAL SIGNS: BP 106/70 mmHg  Pulse 90  Temp(Src) 97.2 F (36.2 C) (Axillary)  Resp 15  Ht 5\' 11"  (1.803 m)  Wt 198 lb 3.1 oz (89.9 kg)  BMI 27.65 kg/m2  SpO2 89%  VENTILATOR SETTINGS: Vent Mode:  [-] PCV FiO2 (%):  [40 %-50 %] 50 % Set Rate:  [16 bmp] 16 bmp PEEP:  [5 cmH20] 5 cmH20 Pressure Support:  [10 cmH20] 10 cmH20 Plateau Pressure:  [20 cmH20-27 cmH20] 20 cmH20  INTAKE / OUTPUT: I/O last 3 completed shifts: In: 5185.3 [I.V.:2765.3; NG/GT:2420] Out: 2676 [Urine:2676]  PHYSICAL EXAMINATION: General:  Sedated on vent Neuro:  RASS -1 HEENT:  ETT in place Cardiovascular: regular Lungs: scattered rhonchi Abdomen:  soft Musculoskeletal:  No edema Skin:  No rashes  LABS:  BMET  Recent Labs Lab 03/11/15 0043 03/12/15 0445 03/13/15 0240  NA 150* 151* 148*  K 4.2 4.5 4.7  CL 118* 117* 115*  CO2 26 29 28   BUN 27* 32* 34*  CREATININE 0.77 0.90 0.81  GLUCOSE 103* 117* 124*    Electrolytes  Recent Labs Lab 03/11/15 0043 03/12/15 0445 03/13/15 0240  CALCIUM 7.9* 7.7* 7.3*  MG 2.0 2.0 2.1  PHOS 1.3* 1.2* 2.1*    CBC  Recent Labs Lab 03/11/15 0311 03/12/15 0445 03/13/15 0240  WBC 4.9 7.6 6.5  HGB 11.6* 12.4* 12.4*  HCT 37.5* 39.3 39.2  PLT 119* 159 130*    Coag's  Recent Labs Lab 03/08/15 1353  INR 1.45    Sepsis Markers  Recent Labs Lab 03/07/15 0328  LATICACIDVEN 2.06*    ABG  Recent Labs Lab 03/09/15 1550 03/11/15 0410 03/12/15 0502  PHART 7.282* 7.304* 7.388  PCO2ART 55.8* 55.8* 49.2*  PO2ART 149* 67.0* 71.0*    Liver Enzymes  Recent Labs Lab 03/07/15 0516 03/10/15 0348 03/11/15 0043  AST 67* 60* 71*  ALT 28 23 26   ALKPHOS 156* 82 97  BILITOT 0.8 0.6 0.6  ALBUMIN 2.2* 1.6*  1.6*    Cardiac Enzymes  Recent Labs Lab 03/10/15 1037 03/10/15 1437 03/10/15 2050  TROPONINI <0.03 <0.03 <0.03    Glucose  Recent Labs Lab 03/12/15 1618 03/12/15 1944 03/12/15 2314 03/13/15 0311 03/13/15 0751 03/13/15 1212  GLUCAP 103* 136* 144* 111* 109* 90    Imaging Dg Chest Port 1 View  03/13/2015  CLINICAL DATA:  Followup for acute respiratory failure. Intubated patient. EXAM: PORTABLE CHEST 1 VIEW COMPARISON:  03/12/2015 FINDINGS: Opacity has increased on the left since the prior study, now extending to the left mid lung obscuring the left heart border well as the hemidiaphragm. Right upper lobe airspace interstitial opacity and right lower and middle lobe interstitial opacities are stable. There is no convincing pneumothorax on the current study. Endotracheal tube tip projects 1 cm above the carina. Enteric feeding tube passes below the diaphragm well into the stomach. There is subcutaneous air along the left lateral chest wall similar to prior exams. IMPRESSION: 1. Worsened lung aeration on the left when compared to the prior exam. This is likely combination of increased pleural fluid with atelectasis. 2. Right lung, primarily right upper lobe, airspace and interstitial lung opacities without significant change from the previous day's study. Electronically Signed   By: Renard Hamper.D.  On: 03/13/2015 08:23     STUDIES:   CULTURES: 1/10 Blood >> Coag neg Staph 1/10 Sputum >> Pneumococcus  ANTIBIOTICS: 1/10 Unasyn >>1/13 1/11 Vancomycin >> 1/12 1/13 Rocephin >>  SIGNIFICANT EVENTS: 1/08 Fall 1/10 VDRF 1/12 Change to pressure control  LINES/TUBES: 1/10 ETT >>   DISCUSSION: 60 yo male smoker fell and had Lt rib fx with Lt PTX.  Developed progressive respiratory failure and required intubation.  ASSESSMENT / PLAN:  PULMONARY A: Acute hypoxic respiratory failure. Tobacco abuse. Lt base ATX on CXR 1/14. P:   Full vent support >> changed to pressure  control on 1/12 F/u CXR >> if ATX no better on 1/15, then consider bronchoscopy Scheduled BDs Decrease nicotine patch  CARDIOVASCULAR A:  Hx of HTN. MAT. P: Continue norvasc, lopressor  RENAL A:   Hypernatremia. Hypophosphatemia. P:   F/u BMET Continue D5W, free water  GASTROINTESTINAL A:   Nutrition. P:   Tube feeds while on vent Protonix for SUP  HEMATOLOGIC A:   Thrombocytopenia. P:  F/u CBC Lovenox for DVT prevention  INFECTIOUS A:   Possible aspiration pneumonia. Coag neg Staph in blood >> likely contaminate. P:   Day 5 abx, currently on rocephin  ENDOCRINE A:   Hypoglycemia >> improved. P:   Monitor blood sugar   NEUROLOGIC A:   Acute encephalopathy. Chronic pain >> he is on chronic methadone as outpt P:   RASS goal: -1 Diprivan and fentanyl gtt's  CC time 32 minutes.  Coralyn HellingVineet Alfredia Desanctis, MD [facility]Critical [facility] 03/13/2015, 1:05 PM Pager:  [phone] After 3pm call: [phone]

## 2015-03-13 NOTE — Clinical Social Work Note (Signed)
Patient currently sedated unable to complete SBIRT at this time, will try at a later time.  Ervin KnackEric R. Egor Fullilove, MSW, Theresia MajorsLCSWA [phone] 03/13/2015 4:35 PM

## 2015-03-13 NOTE — Progress Notes (Signed)
Patient ID: Benjamin Bell, male   DOB: Jul 11, 1955, 60 y.o.   MRN: [id number]  General Surgery - [facility]  HD#: 6  Subjective: Patient in ICU, on vent, sedated.  Nurse at bedside.  Non-responsive.  TF's running.  Objective: Vital signs in last 24 hours: Temp:  [97.1 F (36.2 C)-98.6 F (37 C)] 98.1 F (36.7 C) (01/14 0756) Pulse Rate:  [87-112] 107 (01/14 0830) Resp:  [14-25] 16 (01/14 0830) BP: (89-141)/(60-82) 102/67 mmHg (01/14 0830) SpO2:  [90 %-99 %] 91 % (01/14 0830) FiO2 (%):  [40 %] 40 % (01/14 0749) Weight:  [89.9 kg (198 lb 3.1 oz)] 89.9 kg (198 lb 3.1 oz) (01/14 0404) Last BM Date: 03/13/15  Intake/Output from previous day: 01/13 0701 - 01/14 0700 In: 3878.6 [I.V.:1998.6; NG/GT:1880] Out: 1626 [Urine:1626] Intake/Output this shift: Total I/O In: 334.2 [I.V.:89.2; NG/GT:245] Out: -   Physical Exam: HEENT - sclerae clear, mucous membranes moist Neck - soft Chest - coarse bilaterally; moderate ecchymosis left chest wall Cor - RRR Abdomen - moderate distension; few BS present; multiple healed surgical wounds Ext - diffuse edema GU - moderate scrotal edema; condom cath in place  Lab Results:   Recent Labs  03/12/15 0445 03/13/15 0240  WBC 7.6 6.5  HGB 12.4* 12.4*  HCT 39.3 39.2  PLT 159 130*   BMET  Recent Labs  03/12/15 0445 03/13/15 0240  NA 151* 148*  K 4.5 4.7  CL 117* 115*  CO2 29 28  GLUCOSE 117* 124*  BUN 32* 34*  CREATININE 0.90 0.81  CALCIUM 7.7* 7.3*   PT/INR No results for input(s): LABPROT, INR in the last 72 hours. Comprehensive Metabolic Panel:    Component Value Date/Time   NA 148* 03/13/2015 0240   NA 151* 03/12/2015 0445   K 4.7 03/13/2015 0240   K 4.5 03/12/2015 0445   CL 115* 03/13/2015 0240   CL 117* 03/12/2015 0445   CO2 28 03/13/2015 0240   CO2 29 03/12/2015 0445   BUN 34* 03/13/2015 0240   BUN 32* 03/12/2015 0445   CREATININE 0.81 03/13/2015 0240   CREATININE 0.90 03/12/2015 0445   GLUCOSE 124* 03/13/2015 0240   GLUCOSE 117* 03/12/2015 0445   CALCIUM 7.3* 03/13/2015 0240   CALCIUM 7.7* 03/12/2015 0445   AST 71* 03/11/2015 0043   AST 60* 03/10/2015 0348   ALT 26 03/11/2015 0043   ALT 23 03/10/2015 0348   ALKPHOS 97 03/11/2015 0043   ALKPHOS 82 03/10/2015 0348   BILITOT 0.6 03/11/2015 0043   BILITOT 0.6 03/10/2015 0348   PROT 4.8* 03/11/2015 0043   PROT 4.8* 03/10/2015 0348   ALBUMIN 1.6* 03/11/2015 0043   ALBUMIN 1.6* 03/10/2015 0348    Studies/Results: Dg Chest Port 1 View  03/13/2015  CLINICAL DATA:  Followup for acute respiratory failure. Intubated patient. EXAM: PORTABLE CHEST 1 VIEW COMPARISON:  03/12/2015 FINDINGS: Opacity has increased on the left since the prior study, now extending to the left mid lung obscuring the left heart border well as the hemidiaphragm. Right upper lobe airspace interstitial opacity and right lower and middle lobe interstitial opacities are stable. There is no convincing pneumothorax on the current study. Endotracheal tube tip projects 1 cm above the carina. Enteric feeding tube passes below the diaphragm well into the stomach. There is subcutaneous air along the left lateral chest wall similar to prior exams. IMPRESSION: 1. Worsened lung aeration on the left when compared to the prior exam. This is likely combination of increased pleural  fluid with atelectasis. 2. Right lung, primarily right upper lobe, airspace and interstitial lung opacities without significant change from the previous day's study. Electronically Signed   By: Amie Portlandavid  Ormond M.D.   On: 03/13/2015 08:23   Dg Chest Port 1 View  03/12/2015  CLINICAL DATA:  Respiratory failure. EXAM: PORTABLE CHEST 1 VIEW COMPARISON:  03/11/2015.  03/10/2015.  03/09/2015 . FINDINGS: Endotracheal tube and feeding tube in stable position. Mediastinum and hilar structures normal. Partial clearing of right upper lobe infiltrate. Right upper lobe atelectatic changes. Low lung volumes with left  lower lobe subsegmental atelectasis and or infiltrate. Small left pleural effusion. A subtle miniscule left apical pneumothorax again cannot be completely excluded. Partial clearing of left chest wall subcutaneous emphysema. IMPRESSION: 1. Lines and tubes in stable position. 2. Persistent but partially clearing right upper lobe infiltrate. Right upper lobe atelectatic changes are present . Left lower lobe subsegmental atelectasis and/or infiltrate. 3. Small left pleural effusion. 4. Partial clearing of left chest wall subcutaneous emphysema. A subtle miniscule left apical pneumothorax again cannot be completely excluded . Electronically Signed   By: Maisie Fushomas  Register   On: 03/12/2015 08:09    Anti-infectives: Anti-infectives    Start     Dose/Rate Route Frequency Ordered Stop   03/12/15 1300  cefTRIAXone (ROCEPHIN) 1 g in dextrose 5 % 50 mL IVPB     1 g 100 mL/hr over 30 Minutes Intravenous Every 24 hours 03/12/15 1130     03/10/15 2000  vancomycin (VANCOCIN) IVPB 1000 mg/200 mL premix  Status:  Discontinued     1,000 mg 200 mL/hr over 60 Minutes Intravenous Every 8 hours 03/10/15 1100 03/11/15 1053   03/10/15 1100  vancomycin (VANCOCIN) 1,750 mg in sodium chloride 0.9 % 500 mL IVPB     1,750 mg 250 mL/hr over 120 Minutes Intravenous  Once 03/10/15 1054 03/10/15 1536   03/09/15 1800  ampicillin-sulbactam (UNASYN) 1.5 g in sodium chloride 0.9 % 50 mL IVPB  Status:  Discontinued     1.5 g 100 mL/hr over 30 Minutes Intravenous Every 6 hours 03/09/15 1725 03/12/15 1107      Assessment & Plans: Fall Multiple left rib fractures w/PTX  pulm toilet  Pain control Acute Respiratory Failure  per CCM, on vent Right Upper Lobe PNA  Unasyn, Vancomycin Hypernatremia  Increased, will increase free water Acute encephalopathy  Withdrawal prophylaxis per medical service FEN  TF's at goal  IV access difficult given early anasarca - will request PICC line VTE  Lovenox and SCDs   Velora Hecklerodd M. Eevee Borbon,  MD, [facility] Office: [phone]   Laterria Lasota Judie PetitM 03/13/2015

## 2015-03-14 ENCOUNTER — Inpatient Hospital Stay (HOSPITAL_COMMUNITY): Payer: Medicaid Other

## 2015-03-14 LAB — GLUCOSE, CAPILLARY
GLUCOSE-CAPILLARY: 106 mg/dL — AB (ref 65–99)
GLUCOSE-CAPILLARY: 109 mg/dL — AB (ref 65–99)
GLUCOSE-CAPILLARY: 91 mg/dL (ref 65–99)
GLUCOSE-CAPILLARY: 92 mg/dL (ref 65–99)
Glucose-Capillary: 103 mg/dL — ABNORMAL HIGH (ref 65–99)
Glucose-Capillary: 104 mg/dL — ABNORMAL HIGH (ref 65–99)

## 2015-03-14 LAB — CBC
HCT: 39 % (ref 39.0–52.0)
Hemoglobin: 12.4 g/dL — ABNORMAL LOW (ref 13.0–17.0)
MCH: 30.8 pg (ref 26.0–34.0)
MCHC: 31.8 g/dL (ref 30.0–36.0)
MCV: 97 fL (ref 78.0–100.0)
PLATELETS: 158 10*3/uL (ref 150–400)
RBC: 4.02 MIL/uL — ABNORMAL LOW (ref 4.22–5.81)
RDW: 17.4 % — AB (ref 11.5–15.5)
WBC: 7.3 10*3/uL (ref 4.0–10.5)

## 2015-03-14 LAB — CULTURE, BLOOD (ROUTINE X 2): Culture: NO GROWTH

## 2015-03-14 LAB — BASIC METABOLIC PANEL
Anion gap: 3 — ABNORMAL LOW (ref 5–15)
BUN: 31 mg/dL — ABNORMAL HIGH (ref 6–20)
CALCIUM: 7.4 mg/dL — AB (ref 8.9–10.3)
CO2: 31 mmol/L (ref 22–32)
CREATININE: 0.75 mg/dL (ref 0.61–1.24)
Chloride: 109 mmol/L (ref 101–111)
GFR calc Af Amer: >60 mL/min (ref >60)
GLUCOSE: 122 mg/dL — AB (ref 65–99)
Potassium: 4.2 mmol/L (ref 3.5–5.1)
SODIUM: 143 mmol/L (ref 135–145)

## 2015-03-14 NOTE — Progress Notes (Signed)
Discussed urinary retention with trauma MD as well as CCM MD. Pt has voided 1 time in 4 days, has otherwise been I&O cathed q6hrs.  Scrotal edema, psoriasis.  Discussed options with both MDs.  Order to place foley for now.  Pericare done.  Foley placed using sterile technique with Docia BarrierJosh McDaniel, RN.  Immediate urinary output.  Will continue to monitor pt.

## 2015-03-14 NOTE — Progress Notes (Signed)
Trauma Service Note  Subjective: Intermittent catheterization every 6h for last 48h.  Objective: Vital signs in last 24 hours: Temp:  [97.2 F (36.2 C)-98.5 F (36.9 C)] 97.8 F (36.6 C) (01/15 0738) Pulse Rate:  [36-111] 111 (01/15 0900) Resp:  [12-25] 17 (01/15 0900) BP: (93-121)/(57-79) 95/57 mmHg (01/15 0900) SpO2:  [88 %-100 %] 94 % (01/15 0900) FiO2 (%):  [45 %-50 %] 45 % (01/15 0755) Weight:  [91.6 kg (201 lb 15.1 oz)] 91.6 kg (201 lb 15.1 oz) (01/15 0415) Last BM Date: 03/14/15  Intake/Output from previous day: 01/14 0701 - 01/15 0700 In: 3620.8 [I.V.:2140.8; NG/GT:1480] Out: 1650 [Urine:1650] Intake/Output this shift: Total I/O In: 139.2 [I.V.:94.2; NG/GT:45] Out: -   General: intubated, sedated  Lungs: coarse b/l  Abd: soft, NT, ND  Extremities: +edema  Neuro: GCS 6t.  Lab Results: CBC   Recent Labs  03/13/15 0240 03/14/15 0430  WBC 6.5 7.3  HGB 12.4* 12.4*  HCT 39.2 39.0  PLT 130* 158   BMET  Recent Labs  03/13/15 0240 03/14/15 0430  NA 148* 143  K 4.7 4.2  CL 115* 109  CO2 28 31  GLUCOSE 124* 122*  BUN 34* 31*  CREATININE 0.81 0.75  CALCIUM 7.3* 7.4*   PT/INR No results for input(s): LABPROT, INR in the last 72 hours. ABG  Recent Labs  03/12/15 0502  PHART 7.388  HCO3 29.3*    Studies/Results: Dg Chest Port 1 View  03/14/2015  CLINICAL DATA:  Respiratory failure, shortness of breath, hypertension, smoker EXAM: PORTABLE CHEST 1 VIEW COMPARISON:  Portable exam 0629 hours compared to 03/13/2015 FINDINGS: Tip of endotracheal tube projects 4.1 cm above carina. Feeding tube extends into stomach. RIGHT arm PICC line tip projects over cavoatrial junction. Borderline enlargement of cardiac silhouette. Central peribronchial thickening with BILATERAL pulmonary infiltrates greatest in RIGHT upper lobe. Small LEFT pleural effusion and persistent LEFT lower lobe atelectasis. No definite pneumothorax though tips of lung apices are excluded.  IMPRESSION: Bronchitic changes with persistent BILATERAL pulmonary infiltrates especially RIGHT upper lobe. Persistent LEFT lower lobe atelectasis and LEFT pleural effusion. Electronically Signed   By: Ulyses Southward M.D.   On: 03/14/2015 10:03    Anti-infectives: Anti-infectives    Start     Dose/Rate Route Frequency Ordered Stop   03/12/15 1300  cefTRIAXone (ROCEPHIN) 1 g in dextrose 5 % 50 mL IVPB     1 g 100 mL/hr over 30 Minutes Intravenous Every 24 hours 03/12/15 1130     03/10/15 2000  vancomycin (VANCOCIN) IVPB 1000 mg/200 mL premix  Status:  Discontinued     1,000 mg 200 mL/hr over 60 Minutes Intravenous Every 8 hours 03/10/15 1100 03/11/15 1053   03/10/15 1100  vancomycin (VANCOCIN) 1,750 mg in sodium chloride 0.9 % 500 mL IVPB     1,750 mg 250 mL/hr over 120 Minutes Intravenous  Once 03/10/15 1054 03/10/15 1536   03/09/15 1800  ampicillin-sulbactam (UNASYN) 1.5 g in sodium chloride 0.9 % 50 mL IVPB  Status:  Discontinued     1.5 g 100 mL/hr over 30 Minutes Intravenous Every 6 hours 03/09/15 1725 03/12/15 1107      Medications Scheduled Meds: . amLODipine  10 mg Per Tube Daily  . antiseptic oral rinse  7 mL Mouth Rinse 10 times per day  . cefTRIAXone (ROCEPHIN)  IV  1 g Intravenous Q24H  . chlorhexidine gluconate  15 mL Mouth Rinse BID  . enoxaparin (LOVENOX) injection  30 mg Subcutaneous Q12H  .  free water  200 mL Per Tube Q4H  . ipratropium-albuterol  3 mL Nebulization Q6H  . metoprolol tartrate  25 mg Per Tube BID  . nicotine  7 mg Transdermal Daily  . pantoprazole sodium  40 mg Per Tube Q24H  . senna-docusate  1 tablet Oral BID  . sodium chloride  10-40 mL Intracatheter Q12H   Continuous Infusions: . dextrose 75 mL/hr at 03/14/15 0800  . feeding supplement (PIVOT 1.5 CAL) 1,000 mL (03/13/15 1900)  . fentaNYL infusion INTRAVENOUS 40 mcg/hr (03/14/15 0900)  . propofol (DIPRIVAN) infusion 25 mcg/kg/min (03/14/15 0900)   PRN Meds:.acetaminophen (TYLENOL) oral liquid  160 mg/5 mL, albuterol, bisacodyl, fentaNYL, fentaNYL (SUBLIMAZE) injection, midazolam, [DISCONTINUED] ondansetron **OR** ondansetron (ZOFRAN) IV, sodium chloride  Assessment/Plan: Rib fractures with prolonged respiratory failure PTX Insert foley catheter Continue tube feeds Continue chest tube, f/u am xr Vent per PCCM  LOS: 7 days   De BlanchLuke Aaron Kinsinger Trauma Surgeon [phone]--office [facility] 03/14/2015

## 2015-03-14 NOTE — Progress Notes (Signed)
PULMONARY / CRITICAL CARE MEDICINE   Name: Benjamin Bell MRN: [id number] DOB: [birth year]    ADMISSION DATE:  03/07/2015 CONSULTATION DATE:  03/09/2015  REFERRING MD:  Trauma  CHIEF COMPLAINT:  Fall  SUBJECTIVE:  Reported to have breath stacking.  VITAL SIGNS: BP 95/57 mmHg  Pulse 111  Temp(Src) 97.8 F (36.6 C) (Axillary)  Resp 17  Ht 5\' 11"  (1.803 m)  Wt 201 lb 15.1 oz (91.6 kg)  BMI 28.18 kg/m2  SpO2 94%  VENTILATOR SETTINGS: Vent Mode:  [-] PSV FiO2 (%):  [45 %-50 %] 45 % Set Rate:  [16 bmp] 16 bmp Vt Set:  [600 mL] 600 mL PEEP:  [5 cmH20] 5 cmH20 Pressure Support:  [10 cmH20] 10 cmH20 Plateau Pressure:  [18 cmH20-19 cmH20] 19 cmH20  INTAKE / OUTPUT: I/O last 3 completed shifts: In: 5431.2 [I.V.:3211.2; NG/GT:2220] Out: 2150 [Urine:2150]  PHYSICAL EXAMINATION: General:  Sedated on vent Neuro:  RASS -2 HEENT:  ETT in place Cardiovascular: regular Lungs: scattered rhonchi Abdomen:  soft Musculoskeletal:  No edema Skin:  No rashes  LABS:  BMET  Recent Labs Lab 03/12/15 0445 03/13/15 0240 03/14/15 0430  NA 151* 148* 143  K 4.5 4.7 4.2  CL 117* 115* 109  CO2 29 28 31   BUN 32* 34* 31*  CREATININE 0.90 0.81 0.75  GLUCOSE 117* 124* 122*    Electrolytes  Recent Labs Lab 03/11/15 0043 03/12/15 0445 03/13/15 0240 03/14/15 0430  CALCIUM 7.9* 7.7* 7.3* 7.4*  MG 2.0 2.0 2.1  --   PHOS 1.3* 1.2* 2.1*  --     CBC  Recent Labs Lab 03/12/15 0445 03/13/15 0240 03/14/15 0430  WBC 7.6 6.5 7.3  HGB 12.4* 12.4* 12.4*  HCT 39.3 39.2 39.0  PLT 159 130* 158    Coag's  Recent Labs Lab 03/08/15 1353  INR 1.45    Sepsis Markers No results for input(s): LATICACIDVEN, PROCALCITON, O2SATVEN in the last 168 hours.  ABG  Recent Labs Lab 03/09/15 1550 03/11/15 0410 03/12/15 0502  PHART 7.282* 7.304* 7.388  PCO2ART 55.8* 55.8* 49.2*  PO2ART 149* 67.0* 71.0*    Liver Enzymes  Recent Labs Lab 03/10/15 0348 03/11/15 0043  AST 60* 71*   ALT 23 26  ALKPHOS 82 97  BILITOT 0.6 0.6  ALBUMIN 1.6* 1.6*    Cardiac Enzymes  Recent Labs Lab 03/10/15 1037 03/10/15 1437 03/10/15 2050  TROPONINI <0.03 <0.03 <0.03    Glucose  Recent Labs Lab 03/13/15 1212 03/13/15 1551 03/13/15 1948 03/13/15 2324 03/14/15 0315 03/14/15 0737  GLUCAP 90 101* 93 97 106* 109*    Imaging No results found.   STUDIES:   CULTURES: 1/10 Blood >> Coag neg Staph 1/10 Sputum >> Pneumococcus  ANTIBIOTICS: 1/10 Unasyn >>1/13 1/11 Vancomycin >> 1/12 1/13 Rocephin >>  SIGNIFICANT EVENTS: 1/08 Fall 1/10 VDRF 1/12 Change to pressure control  LINES/TUBES: 1/10 ETT >>   DISCUSSION: 60 yo male smoker fell and had Lt rib fx with Lt PTX.  Developed progressive respiratory failure and required intubation.  ASSESSMENT / PLAN:  PULMONARY A: Acute hypoxic respiratory failure. Tobacco abuse. Lt base ATX on CXR 1/14 >> some improvement 1/15. P:   Pressure support wean as tolerated >> not ready for extubation yet Pressure control as rest mode >> breath stacking improved with increasing inspiratory time F/u CXR Scheduled BDs Decrease nicotine patch as tolerated  CARDIOVASCULAR A:  Hx of HTN. MAT. P: Continue norvasc, lopressor  RENAL A:   Hypernatremia. Hypophosphatemia. Urine retention. P:  F/u BMET Continue D5W, free water  GASTROINTESTINAL A:   Nutrition. P:   Tube feeds while on vent Protonix for SUP  HEMATOLOGIC A:   Thrombocytopenia. P:  F/u CBC Lovenox for DVT prevention  INFECTIOUS A:   Pneumococcal PNA. Coag neg Staph in blood >> likely contaminate. P:   Day 6 abx, currently on rocephin  ENDOCRINE A:   Hypoglycemia >> improved. P:   Monitor blood sugar   NEUROLOGIC A:   Acute encephalopathy. Chronic pain >> he is on chronic methadone as outpt P:   RASS goal: -1 Diprivan and fentanyl gtt's  Updated pt's wife at bedside.  CC time 31 minutes.  Coralyn HellingVineet Kenadee Gates, MD [facility]  Pulmonary/Critical Care 03/14/2015, 10:03 AM Pager:  [phone] After 3pm call: [phone]

## 2015-03-15 ENCOUNTER — Inpatient Hospital Stay (HOSPITAL_COMMUNITY): Payer: Medicaid Other

## 2015-03-15 DIAGNOSIS — R339 Retention of urine, unspecified: Secondary | ICD-10-CM | POA: Diagnosis not present

## 2015-03-15 DIAGNOSIS — I714 Abdominal aortic aneurysm, without rupture, unspecified: Secondary | ICD-10-CM | POA: Diagnosis present

## 2015-03-15 DIAGNOSIS — K7460 Unspecified cirrhosis of liver: Secondary | ICD-10-CM | POA: Diagnosis present

## 2015-03-15 DIAGNOSIS — E278 Other specified disorders of adrenal gland: Secondary | ICD-10-CM | POA: Diagnosis present

## 2015-03-15 DIAGNOSIS — R918 Other nonspecific abnormal finding of lung field: Secondary | ICD-10-CM | POA: Diagnosis present

## 2015-03-15 DIAGNOSIS — I10 Essential (primary) hypertension: Secondary | ICD-10-CM

## 2015-03-15 LAB — GLUCOSE, CAPILLARY
GLUCOSE-CAPILLARY: 124 mg/dL — AB (ref 65–99)
GLUCOSE-CAPILLARY: 124 mg/dL — AB (ref 65–99)
Glucose-Capillary: 105 mg/dL — ABNORMAL HIGH (ref 65–99)
Glucose-Capillary: 97 mg/dL (ref 65–99)
Glucose-Capillary: 114 mg/dL — ABNORMAL HIGH (ref 65–99)

## 2015-03-15 LAB — CBC
HCT: 37.3 % — ABNORMAL LOW (ref 39.0–52.0)
Hemoglobin: 11.7 g/dL — ABNORMAL LOW (ref 13.0–17.0)
MCH: 30.2 pg (ref 26.0–34.0)
MCHC: 31.4 g/dL (ref 30.0–36.0)
MCV: 96.4 fL (ref 78.0–100.0)
PLATELETS: 165 10*3/uL (ref 150–400)
RBC: 3.87 MIL/uL — ABNORMAL LOW (ref 4.22–5.81)
RDW: 17.4 % — AB (ref 11.5–15.5)
WBC: 7.6 10*3/uL (ref 4.0–10.5)

## 2015-03-15 LAB — BASIC METABOLIC PANEL
Anion gap: 5 (ref 5–15)
BUN: 35 mg/dL — AB (ref 6–20)
CALCIUM: 7.3 mg/dL — AB (ref 8.9–10.3)
CHLORIDE: 106 mmol/L (ref 101–111)
CO2: 31 mmol/L (ref 22–32)
CREATININE: 0.85 mg/dL (ref 0.61–1.24)
GFR calc Af Amer: >60 mL/min (ref >60)
GFR calc non Af Amer: >60 mL/min (ref >60)
Glucose, Bld: 113 mg/dL — ABNORMAL HIGH (ref 65–99)
Potassium: 4.6 mmol/L (ref 3.5–5.1)
SODIUM: 142 mmol/L (ref 135–145)

## 2015-03-15 MED ORDER — PANTOPRAZOLE SODIUM 40 MG IV SOLR: 40.0000 mg | INTRAVENOUS | Status: DC

## 2015-03-15 MED ORDER — PANTOPRAZOLE SODIUM 40 MG PO PACK
40.0000 mg | PACK | Freq: Every day | ORAL | Status: DC
  Administered 2015-03-15: 40 mg
  Filled 2015-03-15: qty 20

## 2015-03-15 MED ORDER — BETHANECHOL CHLORIDE 25 MG PO TABS
25.0000 mg | ORAL_TABLET | Freq: Four times a day (QID) | ORAL | Status: DC
  Administered 2015-03-15: 25 mg
  Filled 2015-03-15 (×4): qty 1

## 2015-03-15 MED ORDER — FUROSEMIDE 10 MG/ML IJ SOLN
20.0000 mg | Freq: Two times a day (BID) | INTRAMUSCULAR | Status: DC
  Administered 2015-03-15 – 2015-03-24 (×19): 20 mg via INTRAVENOUS
  Filled 2015-03-15 (×19): qty 2

## 2015-03-15 MED ORDER — FENTANYL CITRATE (PF) 100 MCG/2ML IJ SOLN
25.0000 ug | INTRAMUSCULAR | Status: DC | PRN
  Administered 2015-03-15 – 2015-03-18 (×30): 100 ug via INTRAVENOUS
  Filled 2015-03-15 (×30): qty 2

## 2015-03-15 MED ORDER — PRO-STAT SUGAR FREE PO LIQD
30.0000 mL | Freq: Every day | ORAL | Status: DC
  Administered 2015-03-15: 30 mL
  Filled 2015-03-15: qty 30

## 2015-03-15 MED ORDER — IPRATROPIUM-ALBUTEROL 0.5-2.5 (3) MG/3ML IN SOLN: 3.0000 mL | Freq: Four times a day (QID) | RESPIRATORY_TRACT | Status: DC | PRN

## 2015-03-15 MED ORDER — PIVOT 1.5 CAL PO LIQD
1000.0000 mL | ORAL | Status: DC
  Administered 2015-03-15: 1000 mL

## 2015-03-15 NOTE — Progress Notes (Signed)
eLink Pharmacist-Brief Progress Note Patient Name: Corky MullJames Rullo DOB: 1955-03-08 MRN: [id number]   Date of Service  03/15/2015  HPI/Events of Note  Pt recently extubated 01/16. Medications reviewed.   eICU Interventions  D/c SUP   eLink Provider: Luci Bankakesh Alva  Adreana Coull P 03/15/2015, 4:12 PM

## 2015-03-15 NOTE — Progress Notes (Signed)
Patient ID: Benjamin Bell, male   DOB: 04/25/1955, 60 y.o.   MRN: [id number]   LOS: 8 days   Subjective: Sedated, on vent.   Objective: Vital signs in last 24 hours: Temp:  [97.7 F (36.5 C)-98.8 F (37.1 C)] 98.1 F (36.7 C) (01/16 0817) Pulse Rate:  [77-111] 104 (01/16 0817) Resp:  [11-26] 18 (01/16 0817) BP: (72-124)/(53-76) 111/65 mmHg (01/16 0817) SpO2:  [91 %-100 %] 97 % (01/16 0817) FiO2 (%):  [40 %-50 %] 40 % (01/16 0817) Weight:  [92.3 kg (203 lb 7.8 oz)] 92.3 kg (203 lb 7.8 oz) (01/16 0407) Last BM Date: 03/14/15   VENT: PS/CPAP 40%  8/5   UOP: 6580ml/h NET: +123945ml/24h TOTAL: +1275946ml/admission   Laboratory CBC  Recent Labs  03/14/15 0430 03/15/15 0430  WBC 7.3 7.6  HGB 12.4* 11.7*  HCT 39.0 37.3*  PLT 158 165   BMET  Recent Labs  03/14/15 0430 03/15/15 0430  NA 143 142  K 4.2 4.6  CL 109 106  CO2 31 31  GLUCOSE 122* 113*  BUN 31* 35*  CREATININE 0.75 0.85  CALCIUM 7.4* 7.3*   CBG (last 3)   Recent Labs  03/14/15 2322 03/15/15 0401 03/15/15 0825  GLUCAP 92 105* 97    Radiology PORTABLE CHEST 1 VIEW  COMPARISON: March 14, 2015.  FINDINGS: Stable cardiomediastinal silhouette. Endotracheal and feeding tubes are unchanged in position. Right-sided PICC line is noted in expected position of cavoatrial junction. No pneumothorax is noted. Stable central pulmonary vascular congestion is noted with probable bilateral perihilar and basilar edema which may be increased compared to prior exam. Increased mild left pleural effusion is noted. Bony thorax is unremarkable.  IMPRESSION: Stable support apparatus. There appears to be increased bilateral perihilar and basilar interstitial densities concerning for edema, with mildly increased left pleural effusion.   Electronically Signed  By: Lupita RaiderJames Green Jr, M.D.  On: 03/15/2015 07:54   Physical Exam General appearance: no distress Resp: clear to auscultation  bilaterally Cardio: regular rate and rhythm GI: Soft, diminished BS   Assessment/Plan: Fall Multiple left rib fractures w/PTX - pulm toilet Acute Respiratory Failure - per CCM, on vent support. Weaning as if he's extubatable but not following commands. Will need to talk to family, would favor trial of extubation but may just need to do trach/PEG. 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 Kalayla Shadden, PA-C Pager: [phone] General Trauma PA Pager: [phone]  03/15/2015

## 2015-03-15 NOTE — Progress Notes (Signed)
Nutrition Follow-up  INTERVENTION:   Continue Pivot 1.5 @ 45 ml/hr via Cortrak tube Add 30 ml Prostat daily  Provides: 1720 kcal, 116 grams protein, and 819 ml H2O TF regimen and propofol at current rate providing 1936 total kcal/day (102 % of kcal needs) Total free water: 2019 ml  NUTRITION DIAGNOSIS:   Inadequate oral intake related to inability to eat as evidenced by NPO status. Ongoing.   GOAL:   Patient will meet greater than or equal to 90% of their needs Met.   MONITOR:   TF tolerance, I & O's, Vent status, Labs  REASON FOR ASSESSMENT:   Consult Enteral/tube feeding initiation and management  ASSESSMENT:   60 yo male smoker fell and had Lt rib fx with Lt PTX. Developed progressive respiratory failure and required intubation.  Patient is currently intubated on ventilator support MV: 14.1 L/min Temp (24hrs), Avg:98.2 F (36.8 C), Min:97.7 F (36.5 C), Max:98.8 F (37.1 C)  Propofol: 8.2 ml/hr provides 216 kcal per day from lipid Free water: 200 ml every 4 hours: 1200 ml Medications reviewed and include: lasix, senokot Cortrak (tip @ ligament of Treitz)   Diet Order:  Diet NPO time specified  Skin:  Reviewed, no issues (Weeping skin hand)  Last BM:  1/16  Height:   Ht Readings from Last 1 Encounters:  03/07/15 '5\' 11"'$  (1.803 m)   Weight:   Wt Readings from Last 1 Encounters:  03/15/15 203 lb 7.8 oz (92.3 kg)  Admission weight: 68 kg (150 lb)  Ideal Body Weight:  78.1 kg  BMI:  Body mass index is 28.39 kg/(m^2).  Estimated Nutritional Needs:   Kcal:  1894  Protein:  100-115 grams  Fluid:  > 1.9 L/day  EDUCATION NEEDS:   No education needs identified at this time  [place], [place], [place] Pager [phone] After Hours Pager

## 2015-03-15 NOTE — Progress Notes (Signed)
Wasted 100cc of Fentanyl gtt with Thomes CakeMari Furr, RN

## 2015-03-15 NOTE — Procedures (Signed)
Extubation Procedure Note  Patient Details:   Name: Corky MullJames Dock DOB: [birth year] MRN: [id number]   Airway Documentation:     Evaluation  O2 sats: stable throughout Complications: No apparent complications Patient did tolerate procedure well. Bilateral Breath Sounds: Rhonchi Suctioning: Airway Yes   Patient extubated to 5L nasal cannula per MD order.  Positive cuff leak.  No evidence of stridor.  Patient able to speak post extubation.  Sats currently 95%.  Vitals are stable.  No apparent complications.    Durwin GlazeBrown, Delon Revelo N 03/15/2015, 11:43 AM

## 2015-03-15 NOTE — Progress Notes (Addendum)
PULMONARY / CRITICAL CARE MEDICINE   Name: Benjamin Bell MRN: [id number] DOB: 10-Feb-1956    ADMISSION DATE:  03/07/2015 CONSULTATION DATE:  03/09/2015  REFERRING MD:  Trauma  CHIEF COMPLAINT:  Fall  SUBJECTIVE:  Reported to have breath stacking.  VITAL SIGNS: BP 100/62 mmHg  Pulse 107  Temp(Src) 98.1 F (36.7 C) (Axillary)  Resp 22  Ht 5\' 11"  (1.803 m)  Wt 92.3 kg (203 lb 7.8 oz)  BMI 28.39 kg/m2  SpO2 95%  VENTILATOR SETTINGS: Vent Mode:  [-] PSV;CPAP FiO2 (%):  [40 %-50 %] 40 % Set Rate:  [16 bmp] 16 bmp PEEP:  [5 cmH20] 5 cmH20 Pressure Support:  [5 cmH20-8 cmH20] 5 cmH20 Plateau Pressure:  [18 cmH20-24 cmH20] 24 cmH20  INTAKE / OUTPUT: I/O last 3 completed shifts: In: 4453.5 [I.V.:2683.5; NG/GT:1620; IV Piggyback:150] Out: 7829 [FAOZH:[id number] [Urine:2445; Stool:3]  PHYSICAL EXAMINATION: General:  Sedated on vent Neuro:  RASS -2 HEENT:  ETT in place Cardiovascular: regular Lungs: scattered rhonchi Abdomen:  soft Musculoskeletal:  No edema Skin:  No rashes  LABS:  BMET  Recent Labs Lab 03/13/15 0240 03/14/15 0430 03/15/15 0430  NA 148* 143 142  K 4.7 4.2 4.6  CL 115* 109 106  CO2 28 31 31   BUN 34* 31* 35*  CREATININE 0.81 0.75 0.85  GLUCOSE 124* 122* 113*    Electrolytes  Recent Labs Lab 03/11/15 0043 03/12/15 0445 03/13/15 0240 03/14/15 0430 03/15/15 0430  CALCIUM 7.9* 7.7* 7.3* 7.4* 7.3*  MG 2.0 2.0 2.1  --   --   PHOS 1.3* 1.2* 2.1*  --   --     CBC  Recent Labs Lab 03/13/15 0240 03/14/15 0430 03/15/15 0430  WBC 6.5 7.3 7.6  HGB 12.4* 12.4* 11.7*  HCT 39.2 39.0 37.3*  PLT 130* 158 165    Coag's  Recent Labs Lab 03/08/15 1353  INR 1.45    Sepsis Markers No results for input(s): LATICACIDVEN, PROCALCITON, O2SATVEN in the last 168 hours.  ABG  Recent Labs Lab 03/09/15 1550 03/11/15 0410 03/12/15 0502  PHART 7.282* 7.304* 7.388  PCO2ART 55.8* 55.8* 49.2*  PO2ART 149* 67.0* 71.0*    Liver Enzymes  Recent Labs Lab  03/10/15 0348 03/11/15 0043  AST 60* 71*  ALT 23 26  ALKPHOS 82 97  BILITOT 0.6 0.6  ALBUMIN 1.6* 1.6*    Cardiac Enzymes  Recent Labs Lab 03/10/15 1037 03/10/15 1437 03/10/15 2050  TROPONINI <0.03 <0.03 <0.03    Glucose  Recent Labs Lab 03/14/15 1202 03/14/15 1542 03/14/15 1954 03/14/15 2322 03/15/15 0401 03/15/15 0825  GLUCAP 103* 104* 91 92 105* 97    Imaging Dg Chest Port 1 View  03/15/2015  CLINICAL DATA:  Respiratory failure. EXAM: PORTABLE CHEST 1 VIEW COMPARISON:  March 14, 2015. FINDINGS: Stable cardiomediastinal silhouette. Endotracheal and feeding tubes are unchanged in position. Right-sided PICC line is noted in expected position of cavoatrial junction. No pneumothorax is noted. Stable central pulmonary vascular congestion is noted with probable bilateral perihilar and basilar edema which may be increased compared to prior exam. Increased mild left pleural effusion is noted. Bony thorax is unremarkable. IMPRESSION: Stable support apparatus. There appears to be increased bilateral perihilar and basilar interstitial densities concerning for edema, with mildly increased left pleural effusion. Electronically Signed   By: Lupita RaiderJames  Green Jr, M.D.   On: 03/15/2015 07:54     STUDIES:   CULTURES: 1/10 Blood >> Coag neg Staph 1/10 Sputum >> Pneumococcus  ANTIBIOTICS: 1/10 Unasyn >>1/13 1/11 Vancomycin >>  1/12 1/13 Rocephin >>  SIGNIFICANT EVENTS: 1/08 Fall 1/10 VDRF 1/12 Change to pressure control  LINES/TUBES: 1/10 ETT >> 1/16  I reviewed CXR myself, ETT.  DISCUSSION: 60 yo male smoker fell and had Lt rib fx with Lt PTX.  Developed progressive respiratory failure and required intubation.  ASSESSMENT / PLAN:  PULMONARY A: Acute hypoxic respiratory failure. Tobacco abuse. Lt base ATX on CXR 1/14 >> some improvement 1/15. Pulmonary nodules on CT P:   PS wean to extubation today. F/u CXR Scheduled BDs Titrate O2 for sat of 88-92%. Will need  repeat CT and f/u regarding pulmonary nodules post discharge.  CARDIOVASCULAR A:  Hx of HTN. MAT. P: Continue norvasc, lopressor  RENAL A:   Hypernatremia. Hypophosphatemia. Urine retention. P:   F/u BMET Continue D5W, currently at 50 ml/hr, would likely discontinue once able tot ake PO. D/C free water Replace electrolytes as indicated.  GASTROINTESTINAL A:   Nutrition. P:   Swallow evaluation ordered. Protonix for SUP  HEMATOLOGIC A:   Thrombocytopenia. P:  F/u CBC Lovenox for DVT prevention  INFECTIOUS A:   Pneumococcal PNA. Coag neg Staph in blood >> likely contaminate. P:   Day 7 abx, currently on rocephin, recommend continuation for 8 days.  ENDOCRINE A:   Hypoglycemia >> improved. P:   Monitor blood sugar   NEUROLOGIC A:   Acute encephalopathy. Chronic pain >> he is on chronic methadone as outpt P:   D/C all sedation.  Patient updated bedside.  Discussed with PCCM-NP, PCCM will sign off, please call back if needed.  Alyson Reedy, M.D. [facility] Pulmonary/Critical Care Medicine. Pager: [phone]. After hours pager: [phone].

## 2015-03-16 ENCOUNTER — Inpatient Hospital Stay (HOSPITAL_COMMUNITY): Payer: Medicaid Other

## 2015-03-16 LAB — BASIC METABOLIC PANEL
Anion gap: 7 (ref 5–15)
BUN: 32 mg/dL — ABNORMAL HIGH (ref 6–20)
CHLORIDE: 104 mmol/L (ref 101–111)
CO2: 31 mmol/L (ref 22–32)
CREATININE: 0.8 mg/dL (ref 0.61–1.24)
Calcium: 7.9 mg/dL — ABNORMAL LOW (ref 8.9–10.3)
Glucose, Bld: 96 mg/dL (ref 65–99)
POTASSIUM: 4.2 mmol/L (ref 3.5–5.1)
SODIUM: 142 mmol/L (ref 135–145)

## 2015-03-16 LAB — GLUCOSE, CAPILLARY
GLUCOSE-CAPILLARY: 68 mg/dL (ref 65–99)
GLUCOSE-CAPILLARY: 89 mg/dL (ref 65–99)
GLUCOSE-CAPILLARY: 90 mg/dL (ref 65–99)
GLUCOSE-CAPILLARY: 92 mg/dL (ref 65–99)
GLUCOSE-CAPILLARY: 93 mg/dL (ref 65–99)
Glucose-Capillary: 86 mg/dL (ref 65–99)
Glucose-Capillary: 75 mg/dL (ref 65–99)
Glucose-Capillary: 103 mg/dL — ABNORMAL HIGH (ref 65–99)

## 2015-03-16 MED ORDER — RESOURCE THICKENUP CLEAR PO POWD
Freq: Once | ORAL | Status: DC
  Filled 2015-03-16: qty 125

## 2015-03-16 MED ORDER — STARCH (THICKENING) PO POWD
ORAL | Status: DC | PRN
  Filled 2015-03-16: qty 227

## 2015-03-16 MED ORDER — METHADONE HCL 10 MG/ML PO CONC: 71.0000 mg | Freq: Every day | ORAL | Status: DC

## 2015-03-16 MED ORDER — METHADONE HCL 5 MG/5ML PO SOLN
1.0000 mg | Freq: Every day | ORAL | Status: DC
  Administered 2015-03-18 – 2015-03-24 (×6): 1 mg via ORAL
  Filled 2015-03-16 (×6): qty 1

## 2015-03-16 MED ORDER — METHADONE HCL 10 MG PO TABS
70.0000 mg | ORAL_TABLET | Freq: Every day | ORAL | Status: DC
  Administered 2015-03-16 – 2015-03-24 (×9): 70 mg via ORAL
  Filled 2015-03-16 (×10): qty 7

## 2015-03-16 MED ORDER — ENSURE ENLIVE PO LIQD
237.0000 mL | Freq: Two times a day (BID) | ORAL | Status: DC
  Administered 2015-03-18 – 2015-03-23 (×8): 237 mL via ORAL

## 2015-03-16 NOTE — Progress Notes (Signed)
OT Evaluation  Clinical Impression: PTA, pt lived with wife in an apt and was independent with mobility and ADL. Pt did not work. Pt presents with significant deconditioning and currently requires Max A +2 with mobility and total A with ADL. Pt desating to 85 on 3L, but rebounds to mid 90s with rest. HR increased to 133 during mobility. Pt will benefit from CIR to facilitate return home with wife, who can provide 24/7 assistance. Will follow acutely to address established goals and maximize functional level of independence with ADL.    03/16/15 1000  OT Visit Information  Last OT Received On 03/16/15  Assistance Needed +2  PT/OT/SLP Co-Evaluation/Treatment Yes  Reason for Co-Treatment Complexity of the patient's impairments (multi-system involvement);Necessary to address cognition/behavior during functional activity;For patient/therapist safety  OT goals addressed during session ADL's and self-care  History of Present Illness 60 yo with hx of COPD  s/p fall with resulting multiple L sided fractures; ant pneumothorax on L. Develpoed respiratory distress. Intubated 03/09/15 - 03/15/15. Acute encephalopathy.   Precautions  Precautions Fall  Home Living  Family/patient expects to be discharged to: Private residence  Living Arrangements Spouse/significant other  Available Help at Discharge Family;Available 24 hours/day  Type of Home Apartment  Home Access Stairs to enter  Entrance Stairs-Number of Steps 2  Entrance Stairs-Rails None  Home Layout One level  Bathroom Shower/Tub Tub/shower unit;Curtain  Shower/tub characteristics Archivist Yes (walker)  How Accessible Accessible via walker  Home Equipment None  Prior Function  Level of Independence Independent  Comments does not work  Pain Assessment  Pain Assessment 0-10  Pain Score 8  Pain Location ribs  Pain Descriptors / Indicators Aching  Pain Intervention(s) Limited activity within  patient's tolerance  Cognition  Arousal/Alertness Awake/alert  Behavior During Therapy Anxious;Flat affect  Overall Cognitive Status Impaired/Different from baseline  Area of Impairment Orientation;Attention;Memory;Following commands;Safety/judgement;Awareness;Problem solving  Current Attention Level Sustained  Memory Decreased recall of precautions;Decreased short-term memory  Following Commands Follows one step commands with increased time  Awareness Intellectual  Problem Solving Slow processing;Decreased initiation;Difficulty sequencing;Requires verbal cues;Requires tactile cues  Upper Extremity Assessment  Upper Extremity Assessment Generalized weakness;RUE deficits/detail;LUE deficits/detail (not able to operate control due to weakness)  RUE Deficits / Details Able to lift arm off pillow  minimally due to weakness. Unable to complete hand to mouth movement. unable to complete full grasp to hold swab. Able to hold cloth but difficulty bringing it to face due to weakness.   RUE Coordination decreased fine motor;decreased gross motor  LUE Deficits / Details generalized UE weakness. unable to bring hand to mouth due to weakness and complaints of chest pain. unable to make full grasp  LUE Coordination decreased fine motor;decreased gross motor  Lower Extremity Assessment  Lower Extremity Assessment Defer to PT evaluation  Cervical / Trunk Assessment  Cervical / Trunk Assessment Other exceptions  Cervical / Trunk Exceptions forward head  ADL  Overall ADL's  Needs assistance/impaired  Eating/Feeding NPO  Grooming Maximal assistance  Upper Body Bathing Total assistance  Lower Body Bathing Total assistance  Upper Body Dressing  Total assistance  Lower Body Dressing Total assistance  Functional mobility during ADLs Maximal assistance;+2 for physical assistance  Vision- Assessment  Additional Comments will further assess  Transfers  Overall transfer level Needs assistance  Equipment  used 2 person hand held assist  Transfers Sit to/from Stand;Stand Pivot Transfers  Sit to Stand Max assist;+2 physical assistance  Stand pivot transfers  Mod assist;+2 physical assistance  General transfer comment Difficulty achieveing full upright posture  Balance  Overall balance assessment Needs assistance  Sitting balance-Leahy Scale Fair  Standing balance-Leahy Scale Poor  OT - End of Session  Equipment Utilized During Treatment Gait belt;Oxygen (3.5L)  Activity Tolerance Patient limited by fatigue  Patient left in chair;with call bell/phone within reach;with chair alarm set  Nurse Communication Mobility status;Precautions  OT Assessment  OT Therapy Diagnosis  Generalized weakness;Cognitive deficits;Acute pain  OT Recommendation/Assessment Patient needs continued OT Services  OT Problem List Decreased strength;Decreased range of motion;Decreased activity tolerance;Impaired balance (sitting and/or standing);Decreased coordination;Decreased cognition;Decreased safety awareness;Decreased knowledge of use of DME or AE;Cardiopulmonary status limiting activity;Pain;Impaired UE functional use;Increased edema  OT Plan  OT Frequency (ACUTE ONLY) Min 2X/week  OT Treatment/Interventions (ACUTE ONLY) Self-care/ADL training;Therapeutic exercise;Energy conservation;DME and/or AE instruction;Therapeutic activities;Cognitive remediation/compensation;Patient/family education;Balance training  OT Recommendation  Recommendations for Other Services Rehab consult  Follow Up Recommendations CIR;Supervision/Assistance - 24 hour  OT Equipment 3 in 1 bedside comode;Tub/shower bench  Individuals Consulted  Consulted and Agree with Results and Recommendations Family member/caregiver  Family Member Consulted wife  Acute Rehab OT Goals  Patient Stated Goal wife - pt to get better  OT Goal Formulation With patient/family  Time For Goal Achievement 03/30/15  Potential to Achieve Goals Good  OT Time  Calculation  OT Start Time (ACUTE ONLY) 1033  OT Stop Time (ACUTE ONLY) 1115  OT Time Calculation (min) 42 min  OT General Charges  $OT Visit 1 Procedure  OT Evaluation  $OT Eval High Complexity 1 Procedure  Written Expression  Dominant Hand Right  Luisa Dago, OTR/L  [phone] 03/16/2015

## 2015-03-16 NOTE — Evaluation (Signed)
Clinical/Bedside Swallow Evaluation Patient Details  Name: Benjamin Bell MRN: [id number] Date of Birth: 11-22-1955  Today's Date: 03/16/2015 Time: SLP Start Time (ACUTE ONLY): 1200 SLP Stop Time (ACUTE ONLY): 1215 SLP Time Calculation (min) (ACUTE ONLY): 15 min  Past Medical History:  Past Medical History  Diagnosis Date  . Chronic back pain   . Hypertension    Past Surgical History: History reviewed. No pertinent past surgical history. HPI:  60 yo male smoker fell and had Lt rib fx with Lt PTX. Developed progressive respiratory failure and required intubation 1/10-1/16.    Assessment / Plan / Recommendation Clinical Impression  Pt presents with mild s/s of dysphagia with risk for aspiration, particularly with thin liquids (coughing, throat-clearing).  He is alert, but confused, with RR c/w appropriate ventilatory/swallow sequence.  Symptoms are reduced with modified POs.  For now, recommend initiating a dysphagia 1 diet with nectar-thick liquids; meds whole in puree. Full supervision to assist with feeding.  SLP will follow for safety on current diet and necessity of instrumental study given MS and prolonged intubation. D/W RN.    Aspiration Risk  Moderate aspiration risk    Diet Recommendation     Medication Administration: Whole meds with puree    Other  Recommendations Oral Care Recommendations: Oral care BID Other Recommendations: Order thickener from pharmacy   Follow up Recommendations   (tba)    Frequency and Duration min 2x/week  2 weeks       Prognosis Prognosis for Safe Diet Advancement: Good      Swallow Study   General Date of Onset: 03/07/15 HPI: 60 yo male smoker fell and had Lt rib fx with Lt PTX. Developed progressive respiratory failure and required intubation 1/10-1/16.  Type of Study: Bedside Swallow Evaluation Previous Swallow Assessment: no Diet Prior to this Study: NPO Temperature Spikes Noted: No Respiratory Status: Nasal cannula History of  Recent Intubation: Yes Length of Intubations (days): 6 days Date extubated: 03/15/15 Behavior/Cognition: Alert;Confused Oral Cavity Assessment: Within Functional Limits Oral Care Completed by SLP: Recent completion by staff Oral Cavity - Dentition: Edentulous Vision: Functional for self-feeding Self-Feeding Abilities: Needs assist Patient Positioning: Upright in bed Baseline Vocal Quality: Hoarse Volitional Cough: Strong Volitional Swallow: Able to elicit    Oral/Motor/Sensory Function Overall Oral Motor/Sensory Function: Within functional limits   Ice Chips Ice chips: Within functional limits Presentation: Spoon   Thin Liquid Thin Liquid: Impaired Presentation: Cup Pharyngeal  Phase Impairments: Multiple swallows;Cough - Immediate    Nectar Thick Nectar Thick Liquid: Impaired Presentation: Cup Pharyngeal Phase Impairments: Suspected delayed Swallow;Multiple swallows   Honey Thick Honey Thick Liquid: Not tested   Puree Puree: Impaired Pharyngeal Phase Impairments: Multiple swallows   Solid  Benjamin Bell L. Benjamin Bell, Benjamin Bell CCC/SLP Pager [phone]    Solid: Not tested        Benjamin Bell Benjamin Bell 03/16/2015,1:20 PM

## 2015-03-16 NOTE — Progress Notes (Signed)
Rehab Admissions Coordinator Note:  Patient was screened by Clois Dupes for appropriateness for an Inpatient Acute Rehab Consult per therapy recommendations. At this time, we are recommending Inpatient Rehab consult. Please place order.  Clois Dupes 03/16/2015, 7:46 PM  I can be reached at [phone].

## 2015-03-16 NOTE — Evaluation (Signed)
Physical Therapy Evaluation Patient Details Name: Benjamin Bell MRN: [id number] DOB: Sep 02, 1955 Today's Date: 03/16/2015   History of Present Illness  60 yo with hx of COPD, HTN, and Chronic Pain requiring Methadone.  s/p fall with resulting multiple L sided fractures; ant pneumothorax on L. Develpoed respiratory distress. Intubated 03/09/15 - 03/15/15. Acute encephalopathy.   Clinical Impression  Pt generally painful and SOB throughout session requiring cues for slow pursed lip breathing.  Pt O2 sats decreased to mid 80's on 3L O2 during mobility and required 4L O2 to maintain sats above 90%.  Pt with poor awareness of situation, memory, and deficits.  Pt would benefit from CIR therapies at D/C to maximize independence and decrease burden of care at D/C.  Will continue to follow.      Follow Up Recommendations CIR    Equipment Recommendations  None recommended by PT    Recommendations for Other Services Rehab consult     Precautions / Restrictions Precautions Precautions: Fall Restrictions Weight Bearing Restrictions: No      Mobility  Bed Mobility Overal bed mobility: Needs Assistance Bed Mobility: Supine to Sit     Supine to sit: Max assist;+2 for physical assistance;HOB elevated     General bed mobility comments: cues for sequencing and technique.  pt needs A for LEs, trunk, and scooting hips to EOB.    Transfers Overall transfer level: Needs assistance Equipment used: 2 person hand held assist Transfers: Sit to/from [employer] Sit to Stand: Max assist;+2 physical assistance Stand pivot transfers: Mod assist;+2 physical assistance       General transfer comment: Difficulty achieveing full upright posture  Ambulation/Gait                Stairs            Wheelchair Mobility    Modified Rankin (Stroke Patients Only)       Balance Overall balance assessment: Needs assistance;History of Falls Sitting-balance support: Bilateral  upper extremity supported;Feet supported Sitting balance-Leahy Scale: Fair     Standing balance support: During functional activity Standing balance-Leahy Scale: Poor                               Pertinent Vitals/Pain Pain Assessment: 0-10 Pain Score: 8  Pain Location: Ribs Pain Descriptors / Indicators: Aching Pain Intervention(s): Limited activity within patient's tolerance;Monitored during session;Premedicated before session;Repositioned    Home Living Family/patient expects to be discharged to:: Private residence Living Arrangements: Spouse/significant other Available Help at Discharge: Family;Available 24 hours/day Type of Home: Apartment Home Access: Stairs to enter Entrance Stairs-Rails: None Entrance Stairs-Number of Steps: 2 Home Layout: One level Home Equipment: None      Prior Function Level of Independence: Independent         Comments: does not work     Higher education careers adviser        Extremity/Trunk Assessment   Upper Extremity Assessment: Defer to OT evaluation           Lower Extremity Assessment: Generalized weakness;Difficult to assess due to impaired cognition      Cervical / Trunk Assessment: Other exceptions  Communication   Communication: Expressive difficulties (Difficult to understand)  Cognition Arousal/Alertness: Awake/alert Behavior During Therapy: Anxious;Flat affect Overall Cognitive Status: Impaired/Different from baseline Area of Impairment: Orientation;Attention;Memory;Following commands;Safety/judgement;Awareness;Problem solving   Current Attention Level: Sustained Memory: Decreased recall of precautions;Decreased short-term memory Following Commands: Follows one step commands with increased time  Awareness: Intellectual Problem Solving: Slow processing;Decreased initiation;Difficulty sequencing;Requires verbal cues;Requires tactile cues      General Comments      Exercises        Assessment/Plan     PT Assessment Patient needs continued PT services  PT Diagnosis Difficulty walking;Generalized weakness;Acute pain   PT Problem List Decreased strength;Decreased activity tolerance;Decreased balance;Decreased mobility;Decreased coordination;Decreased cognition;Decreased knowledge of use of DME;Decreased safety awareness;Cardiopulmonary status limiting activity;Pain  PT Treatment Interventions DME instruction;Gait training;Functional mobility training;Therapeutic activities;Therapeutic exercise;Balance training;Neuromuscular re-education;Cognitive remediation;Patient/family education   PT Goals (Current goals can be found in the Care Plan section) Acute Rehab PT Goals Patient Stated Goal: wife - pt to get better PT Goal Formulation: With family Time For Goal Achievement: 03/30/15 Potential to Achieve Goals: Fair    Frequency Min 3X/week   Barriers to discharge        Co-evaluation PT/OT/SLP Co-Evaluation/Treatment: Yes Reason for Co-Treatment: Complexity of the patient's impairments (multi-system involvement);Necessary to address cognition/behavior during functional activity;For patient/therapist safety PT goals addressed during session: Mobility/safety with mobility;Balance         End of Session Equipment Utilized During Treatment: Gait belt;Oxygen Activity Tolerance: Patient limited by fatigue;Patient limited by pain Patient left: in chair;with call bell/phone within reach;with chair alarm set Nurse Communication: Mobility status         Time: 1610-9604 PT Time Calculation (min) (ACUTE ONLY): 41 min   Charges:   PT Evaluation $PT Eval High Complexity: 1 Procedure PT Treatments $Therapeutic Activity: 8-22 mins   PT G CodesSunny Schlein, [place] [phone] 03/16/2015, 3:24 PM

## 2015-03-16 NOTE — Progress Notes (Signed)
Patient pulled out his Cortrak. SLP to evaluate tomorrow and no per tube medications are ordered for this shift, so no NG tube was reinserted. Will continue to monitor patient and update as needed.

## 2015-03-16 NOTE — Progress Notes (Signed)
Nutrition Follow-up  INTERVENTION:   Ensure Enlive po BID, each supplement provides 350 kcal and 20 grams of protein  NUTRITION DIAGNOSIS:   Inadequate oral intake related to dysphagia as evidenced by meal completion < 50%. Ongoing.   GOAL:   Patient will meet greater than or equal to 90% of their needs Not met.   MONITOR:   PO intake, Supplement acceptance, Diet advancement, Skin, I & O's  ASSESSMENT:   60 yo male smoker fell and had Lt rib fx with Lt PTX. Developed progressive respiratory failure and required intubation.  1/16 extubated 1/16 Cortrak pulled out overnight SLP eval today and progressed diet.   Medications reviewed and include: lasix, senokot-s Weight increased since admission with 10+ L since admisison  Diet Order:  DIET - DYS 1 Room service appropriate?: Yes; Fluid consistency:: Nectar Thick  Skin:  Wound (see comment) (MASD sacrum and scrotum)  Last BM:  1/17  Height:   Ht Readings from Last 1 Encounters:  03/07/15 _0  (1.803 m)   Weight:   Wt Readings from Last 1 Encounters:  03/16/15 195 lb 8.8 oz (88.7 kg)  Admission weight: 182 lb (82.8 kg)  Ideal Body Weight:  78.1 kg  BMI:  Body mass index is 27.29 kg/(m^2).  Estimated Nutritional Needs:   Kcal:  2000-2200  Protein:  105-115 grams  Fluid:  > 2 L/day  EDUCATION NEEDS:   No education needs identified at this time  [place], [place], [place] Pager [phone] After Hours Pager

## 2015-03-16 NOTE — Progress Notes (Signed)
Patient ID: Benjamin Bell, male   DOB: [birth year], 60 y.o.   MRN: [id number]    Subjective: Pulled out his cortrack overnight, C/O L rib pain  Objective: Vital signs in last 24 hours: Temp:  [97.7 F (36.5 C)-98.7 F (37.1 C)] 97.7 F (36.5 C) (01/17 0800) Pulse Rate:  [101-121] 106 (01/17 0700) Resp:  [14-32] 22 (01/17 0700) BP: (98-147)/(61-96) 141/83 mmHg (01/17 0700) SpO2:  [91 %-100 %] 97 % (01/17 0700) FiO2 (%):  [40 %] 40 % (01/16 1106) Weight:  [88.7 kg (195 lb 8.8 oz)] 88.7 kg (195 lb 8.8 oz) (01/17 0348) Last BM Date: 03/15/15  Intake/Output from previous day: 01/16 0701 - 01/17 0700 In: 1939.1 [I.V.:1265.4; NG/GT:623.8; IV Piggyback:50] Out: 3195 [Urine:3195] Intake/Output this shift: Total I/O In: -  Out: 400 [Urine:400]  General appearance: cooperative Resp: clear to auscultation bilaterally Chest wall: left sided chest wall tenderness Cardio: regular rate and rhythm GI: soft, NT, ND Male genitalia: edematous Neurologic: Mental status: Alert, oriented, thought content appropriate  Lab Results: CBC   Recent Labs  03/14/15 0430 03/15/15 0430  WBC 7.3 7.6  HGB 12.4* 11.7*  HCT 39.0 37.3*  PLT 158 165   BMET  Recent Labs  03/15/15 0430 03/16/15 0438  NA 142 142  K 4.6 4.2  CL 106 104  CO2 31 31  GLUCOSE 113* 96  BUN 35* 32*  CREATININE 0.85 0.80  CALCIUM 7.3* 7.9*   PT/INR No results for input(s): LABPROT, INR in the last 72 hours. ABG No results for input(s): PHART, HCO3 in the last 72 hours.  Invalid input(s): PCO2, PO2  Studies/Results: Dg Chest Port 1 View  03/16/2015  CLINICAL DATA:  Acute respiratory failure, history hypertension EXAM: PORTABLE CHEST 1 VIEW COMPARISON:  Portable exam 0604 hours compared to 03/15/2015 FINDINGS: Tip of RIGHT arm PICC line unchanged projecting over high RIGHT atrium. Endotracheal and feeding tubes no longer identified. Enlargement of cardiac silhouette with pulmonary vascular congestion. Scattered  pulmonary infiltrates favor pulmonary edema. Associated moderate LEFT pleural effusion and basilar atelectasis. Tortuosity of thoracic aorta. No pneumothorax or focal bony abnormality. IMPRESSION: Enlargement of cardiac silhouette with pulmonary vascular congestion. Probable mild pulmonary edema, improved. Persistent LEFT pleural effusion and basilar atelectasis. Electronically Signed   By: Ulyses Southward M.D.   On: 03/16/2015 07:58   Dg Chest Port 1 View  03/15/2015  CLINICAL DATA:  Respiratory failure. EXAM: PORTABLE CHEST 1 VIEW COMPARISON:  March 14, 2015. FINDINGS: Stable cardiomediastinal silhouette. Endotracheal and feeding tubes are unchanged in position. Right-sided PICC line is noted in expected position of cavoatrial junction. No pneumothorax is noted. Stable central pulmonary vascular congestion is noted with probable bilateral perihilar and basilar edema which may be increased compared to prior exam. Increased mild left pleural effusion is noted. Bony thorax is unremarkable. IMPRESSION: Stable support apparatus. There appears to be increased bilateral perihilar and basilar interstitial densities concerning for edema, with mildly increased left pleural effusion. Electronically Signed   By: Lupita Raider, M.D.   On: 03/15/2015 07:54    Anti-infectives: Anti-infectives    Start     Dose/Rate Route Frequency Ordered Stop   03/12/15 1300  cefTRIAXone (ROCEPHIN) 1 g in dextrose 5 % 50 mL IVPB     1 g 100 mL/hr over 30 Minutes Intravenous Every 24 hours 03/12/15 1130     03/10/15 2000  vancomycin (VANCOCIN) IVPB 1000 mg/200 mL premix  Status:  Discontinued     1,000 mg 200 mL/hr over 60  Minutes Intravenous Every 8 hours 03/10/15 1100 03/11/15 1053   03/10/15 1100  vancomycin (VANCOCIN) 1,750 mg in sodium chloride 0.9 % 500 mL IVPB     1,750 mg 250 mL/hr over 120 Minutes Intravenous  Once 03/10/15 1054 03/10/15 1536   03/09/15 1800  ampicillin-sulbactam (UNASYN) 1.5 g in sodium chloride 0.9 %  50 mL IVPB  Status:  Discontinued     1.5 g 100 mL/hr over 30 Minutes Intravenous Every 6 hours 03/09/15 1725 03/12/15 1107      Assessment/Plan: Fall Multiple left rib fractures w/PTX - pulm toilet Respiratory Failure - has done well since extubation yesterday Right Upper Lobe PNA - Rocephin D5/7 for Strep PNA Acute encephalopathy - improved Urinary retention - continue foley due to sig penile edema Cirrhosis Chronic pain - if passes swallow eval will resume home methadone  daily Pulmonary/adrenal/hepatic masses -- OP f/u AAA <5cm -- OP f/u Tobacco Abuse HTN  FEN - speech eval, on Lasix VTE- Lovenox and SCDs Dispo - ICU today, therapies   LOS: 9 days    Violeta Gelinas, MD, MPH, FACS Trauma: [phone] General Surgery: [phone]  03/16/2015

## 2015-03-17 LAB — CBC
HEMATOCRIT: 34.4 % — AB (ref 39.0–52.0)
Hemoglobin: 11.1 g/dL — ABNORMAL LOW (ref 13.0–17.0)
MCH: 31.3 pg (ref 26.0–34.0)
MCHC: 32.3 g/dL (ref 30.0–36.0)
MCV: 96.9 fL (ref 78.0–100.0)
Platelets: 167 10*3/uL (ref 150–400)
RBC: 3.55 MIL/uL — ABNORMAL LOW (ref 4.22–5.81)
RDW: 17.7 % — AB (ref 11.5–15.5)
WBC: 7 10*3/uL (ref 4.0–10.5)

## 2015-03-17 LAB — GLUCOSE, CAPILLARY
GLUCOSE-CAPILLARY: 101 mg/dL — AB (ref 65–99)
GLUCOSE-CAPILLARY: 86 mg/dL (ref 65–99)
GLUCOSE-CAPILLARY: 76 mg/dL (ref 65–99)
GLUCOSE-CAPILLARY: 79 mg/dL (ref 65–99)
Glucose-Capillary: 94 mg/dL (ref 65–99)

## 2015-03-17 LAB — BASIC METABOLIC PANEL
Anion gap: 3 — ABNORMAL LOW (ref 5–15)
BUN: 28 mg/dL — AB (ref 6–20)
CHLORIDE: 102 mmol/L (ref 101–111)
CO2: 31 mmol/L (ref 22–32)
CREATININE: 0.81 mg/dL (ref 0.61–1.24)
Calcium: 7.6 mg/dL — ABNORMAL LOW (ref 8.9–10.3)
GFR calc Af Amer: >60 mL/min (ref >60)
GFR calc non Af Amer: >60 mL/min (ref >60)
GLUCOSE: 106 mg/dL — AB (ref 65–99)
POTASSIUM: 4 mmol/L (ref 3.5–5.1)
Sodium: 136 mmol/L (ref 135–145)

## 2015-03-17 NOTE — Evaluation (Signed)
Speech Language Pathology Evaluation Patient Details Name: Rankin Coolman MRN: [id number] DOB: 1955-03-09 Today's Date: 03/17/2015 Time: 4098-1191 SLP Time Calculation (min) (ACUTE ONLY): 14 min  Problem List:  Patient Active Problem List   Diagnosis Date Noted  . Hepatic cirrhosis (HCC) 03/15/2015  . Pulmonary nodules 03/15/2015  . Adrenal mass (HCC) 03/15/2015  . AAA (abdominal aortic aneurysm) (HCC) 03/15/2015  . Urinary retention 03/15/2015  . Pneumonia 03/12/2015  . Hypernatremia 03/12/2015  . Acute respiratory failure following trauma and surgery (HCC) 03/09/2015  . Fall   . Traumatic hemopneumothorax   . Multiple rib fractures 03/07/2015   Past Medical History:  Past Medical History  Diagnosis Date  . Chronic back pain   . Hypertension    Past Surgical History: History reviewed. No pertinent past surgical history. HPI:  60 yo with hx of COPD, HTN, and Chronic Pain requiring Methadone. s/p fall with resulting multiple L sided fractures; ant pneumothorax on L. Develpoed respiratory distress. Intubated 03/09/15 - 03/15/15. Acute encephalopathy.    Assessment / Plan / Recommendation Clinical Impression  Pt presents with mild-moderate cognitive deficits secondary to encephalopathy, including disorientation to elements of time/place/situation; impaired working memory; poor insight and judgment.  Pt will benefit from SLP to address cognition, facilitate independence, and decrease burden of care on caregivers.  Agree with CIR for f/u.      SLP Assessment  Patient needs continued Speech Lanaguage Pathology Services    Follow Up Recommendations  Inpatient Rehab    Frequency and Duration min 2x/week  2 weeks      SLP Evaluation Prior Functioning  Cognitive/Linguistic Baseline: Information not available Type of Home: Apartment  Lives With: Spouse Available Help at Discharge: Family;Available 24 hours/day   Cognition  Overall Cognitive Status: Impaired/Different from  baseline Arousal/Alertness: Awake/alert Orientation Level: Oriented to person;Disoriented to place;Disoriented to time;Disoriented to situation Attention: Selective Selective Attention: Impaired Selective Attention Impairment: Verbal basic;Functional basic Memory: Impaired Memory Impairment: Storage deficit;Retrieval deficit Awareness: Impaired Awareness Impairment: Intellectual impairment Problem Solving: Impaired    Comprehension  Auditory Comprehension Overall Auditory Comprehension: Appears within functional limits for tasks assessed Visual Recognition/Discrimination Discrimination: Within Function Limits Reading Comprehension Reading Status: Not tested    Expression Expression Primary Mode of Expression: Verbal Verbal Expression Overall Verbal Expression: Appears within functional limits for tasks assessed Written Expression Dominant Hand: Right Written Expression: Not tested   Oral / Motor  Oral Motor/Sensory Function Overall Oral Motor/Sensory Function: Within functional limits Motor Speech Overall Motor Speech: Appears within functional limits for tasks assessed   GO                    [name] 03/17/2015, 5:15 PM

## 2015-03-17 NOTE — Progress Notes (Signed)
[facility] Progress Note     Subjective: Pt seen and examined at bedside. Breathing mildly labored on 3 L nasal cannula. Rib pain slightly improved from yesterday. Reports pain limited his mobility with PT yesterday. Tolerating diet. Foley in place due to significant penile edema. Reports last BM yesterday. Complains of mild stomach pain today.   Objective: Vital signs in last 24 hours: Temp:  [97.9 F (36.6 C)-98.7 F (37.1 C)] 98.4 F (36.9 C) (01/18 0400) Pulse Rate:  [99-127] 100 (01/18 0600) Resp:  [10-26] 11 (01/18 0600) BP: (103-141)/(60-87) 109/64 mmHg (01/18 0600) SpO2:  [88 %-99 %] 96 % (01/18 0600) Weight:  [86.9 kg (191 lb 9.3 oz)] 86.9 kg (191 lb 9.3 oz) (01/18 0500) Last BM Date:  (03/15/15)  Intake/Output from previous day: 01/17 0701 - 01/18 0700 In: 1440 [P.O.:240; I.V.:1150; IV Piggyback:50] Out: 3100 [Urine:3100] Intake/Output this shift:    PE: Gen:  Alert, pleasant, mildly labored breathing with belly breathing Card:  RRR, no M/G/R heard Pulm:  3L nasal cannula, upper airway congestion, some wheezing in all lung fields Abd: Soft, NT, very distended, +BS, midline scar from previous ex lap and LLQ scar from previous ostomy GU: foley appropriately placed, significant penile and scrotal swelling, non-tender on exam Ext:  No erythema, edema, or tenderness. Pedal pulses 2+ BL Psych: A&Ox3, appripriate  Lab Results:   Recent Labs  03/15/15 0430 03/17/15 0500  WBC 7.6 7.0  HGB 11.7* 11.1*  HCT 37.3* 34.4*  PLT 165 167   BMET  Recent Labs  03/16/15 0438 03/17/15 0500  NA 142 136  K 4.2 4.0  CL 104 102  CO2 31 31  GLUCOSE 96 106*  BUN 32* 28*  CREATININE 0.80 0.81  CALCIUM 7.9* 7.6*   CMP     Component Value Date/Time   NA 136 03/17/2015 0500   K 4.0 03/17/2015 0500   CL 102 03/17/2015 0500   CO2 31 03/17/2015 0500   GLUCOSE 106* 03/17/2015 0500   BUN 28* 03/17/2015 0500   CREATININE 0.81 03/17/2015 0500   CALCIUM 7.6*  03/17/2015 0500   PROT 4.8* 03/11/2015 0043   ALBUMIN 1.6* 03/11/2015 0043   AST 71* 03/11/2015 0043   ALT 26 03/11/2015 0043   ALKPHOS 97 03/11/2015 0043   BILITOT 0.6 03/11/2015 0043   GFRNONAA >60 03/17/2015 0500   GFRAA >60 03/17/2015 0500   Lipase     Component Value Date/Time   LIPASE 39 03/07/2015 0321   Studies/Results: Dg Chest Port 1 View  03/16/2015  CLINICAL DATA:  Acute respiratory failure, history hypertension EXAM: PORTABLE CHEST 1 VIEW COMPARISON:  Portable exam 0604 hours compared to 03/15/2015 FINDINGS: Tip of RIGHT arm PICC line unchanged projecting over high RIGHT atrium. Endotracheal and feeding tubes no longer identified. Enlargement of cardiac silhouette with pulmonary vascular congestion. Scattered pulmonary infiltrates favor pulmonary edema. Associated moderate LEFT pleural effusion and basilar atelectasis. Tortuosity of thoracic aorta. No pneumothorax or focal bony abnormality. IMPRESSION: Enlargement of cardiac silhouette with pulmonary vascular congestion. Probable mild pulmonary edema, improved. Persistent LEFT pleural effusion and basilar atelectasis. Electronically Signed   By: Ulyses Southward M.D.   On: 03/16/2015 07:58    Anti-infectives: Anti-infectives    Start     Dose/Rate Route Frequency Ordered Stop   03/12/15 1300  cefTRIAXone (ROCEPHIN) 1 g in dextrose 5 % 50 mL IVPB     1 g 100 mL/hr over 30 Minutes Intravenous Every 24 hours 03/12/15 1130  03/10/15 2000  vancomycin (VANCOCIN) IVPB 1000 mg/200 mL premix  Status:  Discontinued     1,000 mg 200 mL/hr over 60 Minutes Intravenous Every 8 hours 03/10/15 1100 03/11/15 1053   03/10/15 1100  vancomycin (VANCOCIN) 1,750 mg in sodium chloride 0.9 % 500 mL IVPB     1,750 mg 250 mL/hr over 120 Minutes Intravenous  Once 03/10/15 1054 03/10/15 1536   03/09/15 1800  ampicillin-sulbactam (UNASYN) 1.5 g in sodium chloride 0.9 % 50 mL IVPB  Status:  Discontinued     1.5 g 100 mL/hr over 30 Minutes Intravenous  Every 6 hours 03/09/15 1725 03/12/15 1107       Assessment/Plan Fall Multiple left rib fractures w/PTX - pulm toilet Respiratory Failure - has done well since extubation 03/15/15 Right Upper Lobe PNA - Rocephin D6/7 for Strep PNA Acute encephalopathy - improved Urinary retention - continue foley due to sig penile edema Cirrhosis - significant distention of abdomen, likely ascites due to cirrhosis. Will discuss IR paracentesis with Dr. Janee Morn Chronic pain - will resume home methadone  daily Pulmonary/adrenal/hepatic masses -- OP f/u AAA <5cm -- OP f/u Tobacco Abuse HTN  FEN - dysphagia 1 diet with nectar-thick liquids, on Lasix VTE- Lovenox and SCDs Dispo - ICU today, therapies   LOS: 10 days    Bobbye Riggs 03/17/2015, 8:30 AM Pager: [phone]

## 2015-03-18 ENCOUNTER — Inpatient Hospital Stay (HOSPITAL_COMMUNITY): Payer: Medicaid Other

## 2015-03-18 DIAGNOSIS — S2249XS Multiple fractures of ribs, unspecified side, sequela: Secondary | ICD-10-CM

## 2015-03-18 DIAGNOSIS — S272XXS Traumatic hemopneumothorax, sequela: Secondary | ICD-10-CM

## 2015-03-18 DIAGNOSIS — G9340 Encephalopathy, unspecified: Secondary | ICD-10-CM

## 2015-03-18 LAB — GLUCOSE, CAPILLARY
GLUCOSE-CAPILLARY: 82 mg/dL (ref 65–99)
Glucose-Capillary: 105 mg/dL — ABNORMAL HIGH (ref 65–99)
Glucose-Capillary: 111 mg/dL — ABNORMAL HIGH (ref 65–99)
Glucose-Capillary: 116 mg/dL — ABNORMAL HIGH (ref 65–99)
Glucose-Capillary: 73 mg/dL (ref 65–99)
Glucose-Capillary: 98 mg/dL (ref 65–99)

## 2015-03-18 MED ORDER — NAPROXEN 250 MG PO TABS
500.0000 mg | ORAL_TABLET | Freq: Two times a day (BID) | ORAL | Status: DC
  Administered 2015-03-18 – 2015-03-24 (×12): 500 mg via ORAL
  Filled 2015-03-18: qty 2
  Filled 2015-03-18: qty 1
  Filled 2015-03-18 (×8): qty 2
  Filled 2015-03-18: qty 1
  Filled 2015-03-18: qty 2
  Filled 2015-03-18: qty 1
  Filled 2015-03-18: qty 2

## 2015-03-18 MED ORDER — LIDOCAINE HCL (PF) 1 % IJ SOLN
INTRAMUSCULAR | Status: AC
  Filled 2015-03-18: qty 10

## 2015-03-18 MED ORDER — OXYCODONE HCL 5 MG PO TABS
5.0000 mg | ORAL_TABLET | ORAL | Status: DC | PRN
Start: 2015-03-18 — End: 2015-03-24
  Administered 2015-03-18 (×2): 10 mg via ORAL
  Administered 2015-03-18: 5 mg via ORAL
  Administered 2015-03-19 – 2015-03-20 (×4): 10 mg via ORAL
  Administered 2015-03-20: 15 mg via ORAL
  Administered 2015-03-20 – 2015-03-21 (×2): 10 mg via ORAL
  Administered 2015-03-21 – 2015-03-22 (×3): 15 mg via ORAL
  Administered 2015-03-23: 10 mg via ORAL
  Administered 2015-03-23 – 2015-03-24 (×3): 15 mg via ORAL
  Filled 2015-03-18: qty 2
  Filled 2015-03-18: qty 3
  Filled 2015-03-18 (×5): qty 2
  Filled 2015-03-18: qty 1
  Filled 2015-03-18: qty 3
  Filled 2015-03-18: qty 2
  Filled 2015-03-18: qty 3
  Filled 2015-03-18 (×2): qty 2
  Filled 2015-03-18: qty 3
  Filled 2015-03-18: qty 2
  Filled 2015-03-18 (×3): qty 3

## 2015-03-18 MED ORDER — MORPHINE SULFATE (PF) 2 MG/ML IV SOLN
2.0000 mg | INTRAVENOUS | Status: DC | PRN
  Administered 2015-03-19: 2 mg via INTRAVENOUS
  Filled 2015-03-18 (×2): qty 1

## 2015-03-18 NOTE — Consult Note (Signed)
Physical Medicine and Rehabilitation Consult Reason for Consult: Acute encephalopathy Referring Physician: Trauma   HPI: Benjamin Bell is a 60 y.o. right handed male with history of COPD with tobacco abuse, hypertension, chronic back pain requiring methadone for pain control. Lives with spouse and independent prior to admission. Wife can assist as needed. One level home with 2 steps to entry. Presented 03/07/2015 after a fall while walking his dog. Noted left-sided chest pain. Denied loss of consciousness. Hypotensive in the ED in the mid 80s. Urine drug screen positive opiates. Chest x-ray completed showing left-sided rib fractures, anterior pneumothorax. Troponin negative. CT cervical spine without acute fracture. Required intubation for airway protection. Conservative care of pneumothorax. Extubated 03/15/2015. Patient demonstrating decreased awareness as well as memory suspect acute encephalopathy. Contact precautions for MRSA PCR screen positive. Patient with increased abdominal distention with CT abdomen and pelvis. Currently planning ultrasound-guided paracentesis. Chronic methadone has been resumed. Dysphagia #1 nectar thick liquid diet. Physical and occupational therapy evaluations completed with recommendations of physical medicine rehabilitation consult.   Review of Systems  Constitutional: Negative for fever and chills.  HENT: Negative for hearing loss.   Eyes: Negative for blurred vision and double vision.  Respiratory: Positive for cough.   Cardiovascular: Positive for leg swelling. Negative for chest pain and palpitations.  Gastrointestinal: Positive for constipation. Negative for nausea and vomiting.  Genitourinary: Positive for urgency. Negative for dysuria and hematuria.  Musculoskeletal: Positive for myalgias, back pain, falls and neck pain.  Skin: Negative for itching.  Neurological: Positive for headaches. Negative for seizures.  All other systems reviewed and are  negative.  Past Medical History  Diagnosis Date  . Chronic back pain   . Hypertension    History reviewed. No pertinent past surgical history. No family history on file. Social History:  reports that he has been smoking.  He does not have any smokeless tobacco history on file. He reports that he does not drink alcohol. His drug history is not on file. Allergies: No Known Allergies Medications Prior to Admission  Medication Sig Dispense Refill  . amLODipine (NORVASC) 10 MG tablet Take 10 mg by mouth daily.    . methadone (DOLOPHINE) 10 MG/5ML solution Take 71 mg by mouth daily.    . potassium chloride (KLOR-CON) 20 MEQ packet Take 20 mEq by mouth daily.      Home: Home Living Family/patient expects to be discharged to:: Private residence Living Arrangements: Spouse/significant other Available Help at Discharge: Family, Available 24 hours/day Type of Home: Apartment Home Access: Stairs to enter [employer] of Steps: 2 Entrance Stairs-Rails: None Home Layout: One level Bathroom Shower/Tub: Tub/shower unit, Engineer, building services: Pharmacist, community: Yes (walker) Home Equipment: None  Lives With: Spouse  Functional History: Prior Function Level of Independence: Independent Comments: does not work Functional Status:  Mobility: Bed Mobility Overal bed mobility: Needs Assistance Bed Mobility: Supine to Sit Supine to sit: Max assist, +2 for physical assistance, HOB elevated General bed mobility comments: cues for sequencing and technique.  pt needs A for LEs, trunk, and scooting hips to EOB.   Transfers Overall transfer level: Needs assistance Equipment used: 2 person hand held assist Transfers: Sit to/from Stand, Stand Pivot Transfers Sit to Stand: Max assist, +2 physical assistance Stand pivot transfers: Mod assist, +2 physical assistance General transfer comment: Difficulty achieveing full upright posture      ADL: ADL Overall ADL's : Needs  assistance/impaired Eating/Feeding: NPO Grooming: Maximal assistance Upper Body Bathing: Total assistance  Lower Body Bathing: Total assistance Upper Body Dressing : Total assistance Lower Body Dressing: Total assistance Functional mobility during ADLs: Maximal assistance, +2 for physical assistance  Cognition: Cognition Overall Cognitive Status: Impaired/Different from baseline Arousal/Alertness: Awake/alert Orientation Level: Oriented X4 Attention: Selective Selective Attention: Impaired Selective Attention Impairment: Verbal basic, Functional basic Memory: Impaired Memory Impairment: Storage deficit, Retrieval deficit Awareness: Impaired Awareness Impairment: Intellectual impairment Problem Solving: Impaired Cognition Arousal/Alertness: Awake/alert Behavior During Therapy: Anxious, Flat affect Overall Cognitive Status: Impaired/Different from baseline Area of Impairment: Orientation, Attention, Memory, Following commands, Safety/judgement, Awareness, Problem solving Current Attention Level: Sustained Memory: Decreased recall of precautions, Decreased short-term memory Following Commands: Follows one step commands with increased time Awareness: Intellectual Problem Solving: Slow processing, Decreased initiation, Difficulty sequencing, Requires verbal cues, Requires tactile cues  Blood pressure 131/88, pulse 112, temperature 97.8 F (36.6 C), temperature source Oral, resp. rate 11, height  (1.803 m), weight 86.9 kg (191 lb 9.3 oz), SpO2 95 %. Physical Exam  Constitutional:   59 year old male appearing older than stated age  HENT:  Head: Normocephalic.  Eyes: EOM are normal.  Neck: Normal range of motion. Neck supple. No thyromegaly present.  Cardiovascular: Normal rate and regular rhythm.   Respiratory: Effort normal and breath sounds normal.  GI:  Abdomen is distended with positive bowel sounds nontender  Neurological: He is alert.  Makes good eye contact with  examiner. He does initiate conversation. He is able to state his age, place and date of birth. Fair but limited awareness of deficits. Follows simple commands. UE grossly 4/5 prox to distal. LE's 2/5 HF, 3/5 KE and 4/5 ADF/APF.  Skin: Skin is warm and dry.    Results for orders placed or performed during the hospital encounter of 03/07/15 (from the past 24 hour(s))  Glucose, capillary     Status: None   Collection Time: 03/17/15  8:05 AM  Result Value Ref Range   Glucose-Capillary 86 65 - 99 mg/dL  Glucose, capillary     Status: None   Collection Time: 03/17/15 11:39 AM  Result Value Ref Range   Glucose-Capillary 94 65 - 99 mg/dL   Comment 1 Notify RN    Comment 2 Document in Chart   Glucose, capillary     Status: None   Collection Time: 03/17/15  3:28 PM  Result Value Ref Range   Glucose-Capillary 76 65 - 99 mg/dL   Comment 1 Notify RN    Comment 2 Document in Chart   Glucose, capillary     Status: None   Collection Time: 03/17/15  7:33 PM  Result Value Ref Range   Glucose-Capillary 79 65 - 99 mg/dL  Glucose, capillary     Status: Abnormal   Collection Time: 03/17/15 11:30 PM  Result Value Ref Range   Glucose-Capillary 116 (H) 65 - 99 mg/dL  Glucose, capillary     Status: None   Collection Time: 03/18/15  3:43 AM  Result Value Ref Range   Glucose-Capillary 82 65 - 99 mg/dL   Dg Chest Port 1 View  03/16/2015  CLINICAL DATA:  Acute respiratory failure, history hypertension EXAM: PORTABLE CHEST 1 VIEW COMPARISON:  Portable exam 0604 hours compared to 03/15/2015 FINDINGS: Tip of RIGHT arm PICC line unchanged projecting over high RIGHT atrium. Endotracheal and feeding tubes no longer identified. Enlargement of cardiac silhouette with pulmonary vascular congestion. Scattered pulmonary infiltrates favor pulmonary edema. Associated moderate LEFT pleural effusion and basilar atelectasis. Tortuosity of thoracic aorta. No pneumothorax or focal bony abnormality. IMPRESSION:  Enlargement of  cardiac silhouette with pulmonary vascular congestion. Probable mild pulmonary edema, improved. Persistent LEFT pleural effusion and basilar atelectasis. Electronically Signed   By: Ulyses Southward M.D.   On: 03/16/2015 07:58    Assessment/Plan: Diagnosis: debility/encephalopathy after fall and polytrauma 1. Does the need for close, 24 hr/day medical supervision in concert with the patient's rehab needs make it unreasonable for this patient to be served in a less intensive setting? Yes 2. Co-Morbidities requiring supervision/potential complications: cirrhosis, hptx 3. Due to bladder management, bowel management, safety, skin/wound care, disease management, medication administration, pain management and patient education, does the patient require 24 hr/day rehab nursing? Yes 4. Does the patient require coordinated care of a physician, rehab nurse, PT (1-2 hrs/day, 5 days/week), OT (1-2 hrs/day, 5 days/week) and SLP (1-2 hrs/day, 5 days/week) to address physical and functional deficits in the context of the above medical diagnosis(es)? Yes Addressing deficits in the following areas: balance, endurance, locomotion, strength, transferring, bowel/bladder control, bathing, dressing, feeding, grooming, toileting, cognition and psychosocial support 5. Can the patient actively participate in an intensive therapy program of at least 3 hrs of therapy per day at least 5 days per week? Yes 6. The potential for patient to make measurable gains while on inpatient rehab is excellent 7. Anticipated functional outcomes upon discharge from inpatient rehab are modified independent and supervision  with PT, modified independent and supervision to min assist with OT, modified independent and supervision  with SLP. 8. Estimated rehab length of stay to reach the above functional goals is: 13-20 days 9. Does the patient have adequate social supports and living environment to accommodate these discharge functional goals?  Yes 10. Anticipated D/C setting: Home 11. Anticipated post D/C treatments: HH therapy 12. Overall Rehab/Functional Prognosis: excellent  RECOMMENDATIONS: This patient's condition is appropriate for continued rehabilitative care in the following setting: CIR Patient has agreed to participate in recommended program. Yes Note that insurance prior authorization may be required for reimbursement for recommended care.  Comment: Rehab Admissions Coordinator to follow up.  Thanks,  Ranelle Oyster, MD, [place] [facility]     03/18/2015

## 2015-03-18 NOTE — Progress Notes (Signed)
Speech Language Pathology Treatment: Dysphagia  Patient Details Name: Benjamin Bell MRN: [id number] DOB: 05-18-1955 Today's Date: 03/18/2015 Time: 7829-5621 SLP Time Calculation (min) (ACUTE ONLY): 18 min  Assessment / Plan / Recommendation Clinical Impression  Treatment focused on potential to advance diet. Patient alert and cooperative. Able to self feed clinician provided po trials with moderate verbal and tactile cueing for small, controlled single sips. Overall, intact appearing airway protection noted with solids and nectar thick liquids. Subtle but present throat clearing noted post swallow with thin liquids in approximately 50% of trials, not decreasing despite cueing for small sips and removal of straw. Suspect continued delay in swallow initiation due to sensory impairments s/p prolonged intubation. Overall improving however. Prognosis for ability to advance liquids good over the next 24-48 hour with continued improvement.    HPI HPI: 60 yo with hx of COPD, HTN, and Chronic Pain requiring Methadone. s/p fall with resulting multiple L sided fractures; ant pneumothorax on L. Develpoed respiratory distress. Intubated 03/09/15 - 03/15/15. Acute encephalopathy.       SLP Plan  Continue with current plan of care     Recommendations  Diet recommendations: Dysphagia 3 (mechanical soft);Nectar-thick liquid Liquids provided via: Cup;Straw Medication Administration: Whole meds with puree Supervision: Patient able to self feed;Full supervision/cueing for compensatory strategies Compensations: Slow rate;Small sips/bites Postural Changes and/or Swallow Maneuvers: Seated upright 90 degrees             Oral Care Recommendations: Oral care BID Follow up Recommendations: Inpatient Rehab Plan: Continue with current plan of care     GO             [facility] MA, CCC-SLP [phone]    Benjamin Bell 03/18/2015, 3:45 PM

## 2015-03-18 NOTE — Progress Notes (Signed)
Patient ID: Benjamin Bell, male   DOB: April 15, 1955, 60 y.o.   MRN: [id number]   LOS: 11 days   Subjective: Doing ok, pain mostly controlled.   Objective: Vital signs in last 24 hours: Temp:  [97.8 F (36.6 C)-99.4 F (37.4 C)] 97.8 F (36.6 C) (01/19 0400) Pulse Rate:  [95-114] 112 (01/19 0800) Resp:  [8-18] 12 (01/19 0800) BP: (107-154)/(61-91) 130/88 mmHg (01/19 0800) SpO2:  [85 %-100 %] 98 % (01/19 0800) Weight:  [87.5 kg (192 lb 14.4 oz)] 87.5 kg (192 lb 14.4 oz) (01/19 0600) Last BM Date: 03/16/15   UOP: ~148ml/h NET: -737ml/24h TOTAL: +9074ml/admission   Laboratory CBG (last 3)   Recent Labs  03/17/15 1933 03/17/15 2330 03/18/15 0343  GLUCAP 79 116* 82    Physical Exam General appearance: alert and no distress Resp: clear to auscultation bilaterally Cardio: regular rate and rhythm GI: Soft, +BS   Assessment/Plan: Fall Multiple left rib fractures w/PTX - pulm toilet Right Upper Lobe PNA - Rocephin D7/7 for Strep PNA Acute encephalopathy - improved Urinary retention - continue foley due to sig penile edema Cirrhosis -- For paracentesis today Chronic pain - home methadone  daily Pulmonary/adrenal/hepatic masses -- OP f/u AAA <5cm -- OP f/u Tobacco Abuse HTN  FEN - dysphagia 1 diet with nectar-thick liquids, on Lasix VTE- Lovenox and SCDs Dispo - Ok for [employer], therapies    [name], PA-C Pager: [phone] General Trauma PA Pager: [phone]  03/18/2015

## 2015-03-18 NOTE — Progress Notes (Signed)
Occupational Therapy Treatment Patient Details Name: Benjamin Bell MRN: [id number] DOB: [birth year] Today's Date: 03/18/2015    History of present illness 60 yo with hx of COPD, HTN, and Chronic Pain requiring Methadone.  s/p fall with resulting multiple L sided fractures; ant pneumothorax on L. Develpoed respiratory distress. Intubated 03/09/15 - 03/15/15. Acute encephalopathy.    OT comments  Making excellent progress. BUE AROM and strength improved. Pt ability to feed himself and complete grooming tasks with min A. Selective attention to task. Pt cooperative and appreciative of help. Continue to recommed CIR for reahb. Will follow acutely.  Follow Up Recommendations  CIR;Supervision/Assistance - 24 hour    Equipment Recommendations  3 in 1 bedside comode;Tub/shower bench    Recommendations for Other Services Rehab consult    Precautions / Restrictions Precautions Precautions: Fall       Mobility Bed Mobility                  Transfers                      Balance                                   ADL Overall ADL's : Needs assistance/impaired Eating/Feeding: Minimal assistance (dysphagia III with nectar. min difficulty maintaining grip o)   Grooming: Minimal assistance;Bed level Grooming Details (indicate cue type and reason): able to wash face, comb hair; wash hands and arms                               General ADL Comments: Tremendous improvemetn from eval. engaged in activity. Cooperative adn appreciative      Vision                     Perception     Praxis      Cognition   Behavior During Therapy: [facility] for tasks assessed/performed Overall Cognitive Status: Impaired/Different from baseline Area of Impairment: Attention;Awareness;Problem solving Orientation Level:  (oriented to city, month and situation) Current Attention Level: Selective      Safety/Judgement: Decreased awareness of deficits Awareness:  Emergent Problem Solving: Slow processing General Comments: improving cognitively. operting remote control    Extremity/Trunk Assessment               Exercises  BUE AROM FF/AB/AD x 20 reps   Shoulder Instructions       General Comments      Pertinent Vitals/ Pain       Pain Assessment: Faces Faces Pain Scale: Hurts a little bit Pain Location: ribs Pain Descriptors / Indicators: Aching Pain Intervention(s): Limited activity within patient's tolerance;Patient requesting pain meds-RN notified  Home Living                                          Prior Functioning/Environment              Frequency Min 2X/week     Progress Toward Goals  OT Goals(current goals can now be found in the care plan section)  Progress towards OT goals: Progressing toward goals  Acute Rehab OT Goals Patient Stated Goal: wife - pt to get better OT Goal Formulation: With patient/family Time For Goal Achievement: 03/30/15 Potential to  Achieve Goals: Good ADL Goals Pt Will Perform Eating: with min assist;sitting Pt Will Perform Grooming: with min assist;sitting Pt Will Perform Upper Body Bathing: with min assist;sitting Pt Will Perform Lower Body Bathing: with min assist;sit to/from stand Pt Will Transfer to Toilet: with min assist;bedside commode Pt/caregiver will Perform Home Exercise Program: Increased ROM;Increased strength;Both right and left upper extremity;With written HEP provided;With Supervision  Plan Discharge plan remains appropriate    Co-evaluation                 End of Session Equipment Utilized During Treatment: Oxygen   Activity Tolerance Patient tolerated treatment well   Patient Left in bed;with call bell/phone within reach   Nurse Communication Mobility status;Patient requests pain meds        Time: 1525-1541 OT Time Calculation (min): 16 min  Charges: OT General Charges $OT Visit: 1 Procedure OT Treatments $Self Care/Home  Management : 8-22 mins  Dazhane Villagomez,HILLARY 03/18/2015, 3:44 PM   [facility], OTR/L  [phone] 03/18/2015

## 2015-03-18 NOTE — Care Management (Signed)
UR updated . Pammie Chirino RN BSN  

## 2015-03-18 NOTE — Progress Notes (Signed)
Physical Therapy Treatment Patient Details Name: Benjamin Bell MRN: [id number] DOB: [birth year] Today's Date: 03/18/2015    History of Present Illness 60 yo with hx of COPD, HTN, and Chronic Pain requiring Methadone.  s/p fall with resulting multiple L sided fractures; ant pneumothorax on L. Develpoed respiratory distress. Intubated 03/09/15 - 03/15/15. Acute encephalopathy.     PT Comments    Pt seems to have less pain today and better participation with mobility.  Pt needs cues for safety and sequencing throughout session, but able to take pivotal steps towards recliner.  Pt needed max encouragement from PT and RN for initially participating as he did not recall that he had pain meds and did not believe PT that RN had given him meds.  After RN confirmed that he had his IV pain meds pt admits to having trouble with his memory.  Continue to feel pt would benefit from CIR level of therapies at D/C.    Follow Up Recommendations  CIR     Equipment Recommendations  None recommended by PT    Recommendations for Other Services       Precautions / Restrictions Precautions Precautions: Fall Restrictions Weight Bearing Restrictions: No    Mobility  Bed Mobility Overal bed mobility: Needs Assistance;+2 for physical assistance Bed Mobility: Rolling;Sidelying to Sit Rolling: Mod assist;+2 for physical assistance Sidelying to sit: Mod assist;+2 for physical assistance;HOB elevated       General bed mobility comments: cues for log roll and sequencing through mobility.    Transfers Overall transfer level: Needs assistance Equipment used: 2 person hand held assist Transfers: Sit to/from [employer] Sit to Stand: Mod assist;+2 physical assistance Stand pivot transfers: Mod assist;+2 physical assistance       General transfer comment: cues for safe technique and encouragement.  pt better able to bear weight today and move through pivot.    Ambulation/Gait                  Stairs            Wheelchair Mobility    Modified Rankin (Stroke Patients Only)       Balance Overall balance assessment: Needs assistance Sitting-balance support: Bilateral upper extremity supported;Feet supported Sitting balance-Leahy Scale: Fair Sitting balance - Comments: Utilizes UEs for balance support and pain relief in L ribs.   Standing balance support: During functional activity Standing balance-Leahy Scale: Poor Standing balance comment: pt with more upright posture today and better able to bear weight through LEs.                      Cognition Arousal/Alertness: Awake/alert Behavior During Therapy: WFL for tasks assessed/performed Overall Cognitive Status: Impaired/Different from baseline Area of Impairment: Orientation;Attention;Memory;Following commands;Safety/judgement;Awareness;Problem solving Orientation Level: Disoriented to;Place;Time;Situation Current Attention Level: Sustained Memory: Decreased recall of precautions;Decreased short-term memory Following Commands: Follows one step commands with increased time Safety/Judgement: Decreased awareness of safety;Decreased awareness of deficits Awareness: Intellectual Problem Solving: Slow processing;Decreased initiation;Difficulty sequencing;Requires verbal cues;Requires tactile cues General Comments: pt easier to be oriented today and admits to having trouble with his memory lately.      Exercises      General Comments        Pertinent Vitals/Pain Pain Assessment: 0-10 Pain Score: 7  Pain Location: Ribs and back Pain Descriptors / Indicators: Aching Pain Intervention(s): Monitored during session;Premedicated before session;Repositioned    Home Living  Prior Function            PT Goals (current goals can now be found in the care plan section) Acute Rehab PT Goals Patient Stated Goal: wife - pt to get better PT Goal Formulation: With family Time  For Goal Achievement: 03/30/15 Potential to Achieve Goals: Fair Progress towards PT goals: Progressing toward goals    Frequency  Min 3X/week    PT Plan Current plan remains appropriate    Co-evaluation             End of Session Equipment Utilized During Treatment: Gait belt;Oxygen Activity Tolerance: Patient limited by fatigue Patient left: in chair;with call bell/phone within reach;with chair alarm set     Time: 1610-9604 PT Time Calculation (min) (ACUTE ONLY): 32 min  Charges:  $Therapeutic Activity: 23-37 mins                    G CodesSunny Bell, [place] [phone] 03/18/2015, 1:31 PM

## 2015-03-18 NOTE — Progress Notes (Signed)
SLP Cancellation Note  Patient Details Name: Teri Diltz MRN: [id number] DOB: 01-24-1956   Cancelled treatment:       Reason Eval/Treat Not Completed: Patient at procedure or test/unavailable (Patient NPO for procedure)  Ferdinand Lango MA, CCC-SLP [phone]  Ferdinand Lango Meryl 03/18/2015, 10:47 AM

## 2015-03-19 DIAGNOSIS — R002 Palpitations: Secondary | ICD-10-CM

## 2015-03-19 DIAGNOSIS — W19XXXS Unspecified fall, sequela: Secondary | ICD-10-CM

## 2015-03-19 DIAGNOSIS — D62 Acute posthemorrhagic anemia: Secondary | ICD-10-CM | POA: Diagnosis not present

## 2015-03-19 LAB — BASIC METABOLIC PANEL
Anion gap: 4 — ABNORMAL LOW (ref 5–15)
BUN: 21 mg/dL — AB (ref 6–20)
CHLORIDE: 100 mmol/L — AB (ref 101–111)
CO2: 32 mmol/L (ref 22–32)
CREATININE: 0.88 mg/dL (ref 0.61–1.24)
Calcium: 7.6 mg/dL — ABNORMAL LOW (ref 8.9–10.3)
GFR calc Af Amer: >60 mL/min (ref >60)
GFR calc non Af Amer: >60 mL/min (ref >60)
GLUCOSE: 81 mg/dL (ref 65–99)
POTASSIUM: 3.4 mmol/L — AB (ref 3.5–5.1)
SODIUM: 136 mmol/L (ref 135–145)

## 2015-03-19 LAB — CBC
HEMATOCRIT: 34.1 % — AB (ref 39.0–52.0)
HEMOGLOBIN: 10.8 g/dL — AB (ref 13.0–17.0)
MCH: 31 pg (ref 26.0–34.0)
MCHC: 31.7 g/dL (ref 30.0–36.0)
MCV: 98 fL (ref 78.0–100.0)
Platelets: 165 10*3/uL (ref 150–400)
RBC: 3.48 MIL/uL — ABNORMAL LOW (ref 4.22–5.81)
RDW: 17.5 % — ABNORMAL HIGH (ref 11.5–15.5)
WBC: 6.7 10*3/uL (ref 4.0–10.5)

## 2015-03-19 LAB — GLUCOSE, CAPILLARY
GLUCOSE-CAPILLARY: 108 mg/dL — AB (ref 65–99)
Glucose-Capillary: 75 mg/dL (ref 65–99)
Glucose-Capillary: 75 mg/dL (ref 65–99)
Glucose-Capillary: 75 mg/dL (ref 65–99)
Glucose-Capillary: 85 mg/dL (ref 65–99)

## 2015-03-19 LAB — TSH: TSH: 3.59 u[IU]/mL (ref 0.350–4.500)

## 2015-03-19 MED ORDER — POTASSIUM CHLORIDE 20 MEQ PO PACK
20.0000 meq | PACK | Freq: Every day | ORAL | Status: DC
  Filled 2015-03-19: qty 1

## 2015-03-19 MED ORDER — TAMSULOSIN HCL 0.4 MG PO CAPS
0.8000 mg | ORAL_CAPSULE | Freq: Every day | ORAL | Status: DC
  Administered 2015-03-19 – 2015-03-24 (×6): 0.8 mg via ORAL
  Filled 2015-03-19 (×6): qty 2

## 2015-03-19 MED ORDER — POTASSIUM CHLORIDE CRYS ER 20 MEQ PO TBCR
20.0000 meq | EXTENDED_RELEASE_TABLET | Freq: Every day | ORAL | Status: DC
  Administered 2015-03-19 – 2015-03-24 (×6): 20 meq via ORAL
  Filled 2015-03-19 (×6): qty 1

## 2015-03-19 MED ORDER — BETHANECHOL CHLORIDE 25 MG PO TABS
25.0000 mg | ORAL_TABLET | Freq: Four times a day (QID) | ORAL | Status: DC
  Administered 2015-03-19 – 2015-03-24 (×21): 25 mg via ORAL
  Filled 2015-03-19 (×21): qty 1

## 2015-03-19 NOTE — Progress Notes (Signed)
Rehab admissions - I rounded on patient this am.  RN tells me patient now in atrial fibrillation and cardiology has been consulted.  I will call and talk to wife about inpatient rehab option.  Will follow up on Monday for progress and plans.  Call me for questions.  #[phone]

## 2015-03-19 NOTE — Consult Note (Signed)
Primary Physician: Primary Cardiologist:  New    Asked to see by Trauma MD for atrial fib  HPI: 60 yo who was admittd on 1/8 after fall Today on telemetry noted a lot of ectopy by nursing felt to be atrial fib. Consult called   Pt says he was told by his local doctor about 1 year ago that upper chamber beat twice as fast as lower  No records  No work up   He denies palpitations  No CP  No SOB  No dizziness/ syncope     Past Medical History  Diagnosis Date  . Chronic back pain   . Hypertension     Medications Prior to Admission  Medication Sig Dispense Refill  . amLODipine (NORVASC) 10 MG tablet Take 10 mg by mouth daily.    . methadone (DOLOPHINE) 10 MG/5ML solution Take 71 mg by mouth daily.    . potassium chloride (KLOR-CON) 20 MEQ packet Take 20 mEq by mouth daily.       . bethanechol  25 mg Oral QID  . chlorhexidine gluconate  15 mL Mouth Rinse BID  . feeding supplement (ENSURE ENLIVE)  237 mL Oral BID BM  . furosemide  20 mg Intravenous BID  . methadone  70 mg Oral Daily   And  . methadone  1 mg Oral Daily  . naproxen  500 mg Oral BID WC  . nicotine  7 mg Transdermal Daily  . potassium chloride  20 mEq Oral Daily  . RESOURCE THICKENUP CLEAR   Oral Once  . senna-docusate  1 tablet Oral BID  . sodium chloride  10-40 mL Intracatheter Q12H  . tamsulosin  0.8 mg Oral Daily    Infusions:    No Known Allergies  Social History   Social History  . Marital Status: Married    Spouse Name: N/A  . Number of Children: N/A  . Years of Education: N/A   Occupational History  . Not on file.   Social History Main Topics  . Smoking status: Current Every Day Smoker  . Smokeless tobacco: Not on file  . Alcohol Use: No  . Drug Use: Not on file  . Sexual Activity: Not on file   Other Topics Concern  . Not on file   Social History Narrative    No family history on file.  REVIEW OF SYSTEMS:  All systems reviewed  Negative to the above problem except as  noted above.    PHYSICAL EXAM: Filed Vitals:   03/19/15 1050 03/19/15 1150  BP:  145/81  Pulse: 113 110  Temp:  98.1 F (36.7 C)  Resp: 21 11     Intake/Output Summary (Last 24 hours) at 03/19/15 1413 Last data filed at 03/19/15 0900  Gross per 24 hour  Intake   1620 ml  Output   2430 ml  Net   -810 ml    General: 60 yo in NAD   HEENT: normal Neck: supple. no JVD. Carotids 2+ bilat; no bruits. No lymphadenopathy or thryomegaly appreciated. Cor: PMI nondisplaced. Regular rate & rhythm. No rubs, gallops or murmurs. Lungs: clear Abdomen:  Sl distended  soft, nontender, nondistended. No hepatosplenomegaly. No bruits or masses. Good bowel sounds. Extremities: no cyanosis, clubbing, rash, edema Neuro: alert & oriented x 3, cranial nerves grossly intact. moves all 4 extremities w/o difficulty. Affect pleasant.   ECG:  1/11  ST 102 bpm   Tele  SR, frequent PACs  Occasional PVC/couplet  NO atrial fibrillation  noted    Results for orders placed or performed during the hospital encounter of 03/07/15 (from the past 24 hour(s))  Glucose, capillary     Status: Abnormal   Collection Time: 03/18/15  4:29 PM  Result Value Ref Range   Glucose-Capillary 111 (H) 65 - 99 mg/dL   Comment 1 Notify RN    Comment 2 Document in Chart   Glucose, capillary     Status: None   Collection Time: 03/18/15  7:29 PM  Result Value Ref Range   Glucose-Capillary 98 65 - 99 mg/dL  Glucose, capillary     Status: None   Collection Time: 03/19/15  1:10 AM  Result Value Ref Range   Glucose-Capillary 75 65 - 99 mg/dL  Glucose, capillary     Status: None   Collection Time: 03/19/15  4:12 AM  Result Value Ref Range   Glucose-Capillary 75 65 - 99 mg/dL  CBC     Status: Abnormal   Collection Time: 03/19/15  5:10 AM  Result Value Ref Range   WBC 6.7 4.0 - 10.5 K/uL   RBC 3.48 (L) 4.22 - 5.81 MIL/uL   Hemoglobin 10.8 (L) 13.0 - 17.0 g/dL   HCT 16.1 (L) 09.6 - 04.5 %   MCV 98.0 78.0 - 100.0 fL   MCH 31.0  26.0 - 34.0 pg   MCHC 31.7 30.0 - 36.0 g/dL   RDW 40.9 (H) 81.1 - 91.4 %   Platelets 165 150 - 400 K/uL  Basic metabolic panel     Status: Abnormal   Collection Time: 03/19/15  5:10 AM  Result Value Ref Range   Sodium 136 135 - 145 mmol/L   Potassium 3.4 (L) 3.5 - 5.1 mmol/L   Chloride 100 (L) 101 - 111 mmol/L   CO2 32 22 - 32 mmol/L   Glucose, Bld 81 65 - 99 mg/dL   BUN 21 (H) 6 - 20 mg/dL   Creatinine, Ser 7.82 0.61 - 1.24 mg/dL   Calcium 7.6 (L) 8.9 - 10.3 mg/dL   GFR calc non Af Amer >60 >60 mL/min   GFR calc Af Amer >60 >60 mL/min   Anion gap 4 (L) 5 - 15  Glucose, capillary     Status: None   Collection Time: 03/19/15  8:03 AM  Result Value Ref Range   Glucose-Capillary 75 65 - 99 mg/dL   Comment 1 Notify RN    Comment 2 Document in Chart   Glucose, capillary     Status: Abnormal   Collection Time: 03/19/15 12:31 PM  Result Value Ref Range   Glucose-Capillary 108 (H) 65 - 99 mg/dL   Comment 1 Notify RN    Comment 2 Document in Chart    US Paracentesis  03/18/2015  CLINICAL DATA:  Shortness of breath. Abdominal distention. Request for therapeutic paracentesis. EXAM: ULTRASOUND GUIDED PARACENTESIS COMPARISON:  None. PROCEDURE: An ultrasound guided paracentesis was thoroughly discussed with the patient and questions answered. The benefits, risks, alternatives and complications were also discussed. The patient understands and wishes to proceed with the procedure. Written consent was obtained. Ultrasound was performed and localized some ascites up over the dome of the liver and a small pocket of fluid in the left lower quadrant of the abdomen. The area was then prepped and draped in the normal sterile fashion. 1% Lidocaine was used for local anesthesia. Under ultrasound guidance a 19 gauge Yueh catheter was introduced. Paracentesis was performed. The catheter was removed and a dressing applied. COMPLICATIONS: None immediate  FINDINGS: A total of approximately 600 mL of hazy, yellow  fluid was removed. A fluid sample was not sent for laboratory analysis. IMPRESSION: Successful ultrasound guided paracentesis yielding 600 mL of ascites. Read by: Brayton El PA-C Electronically Signed   By: Gilmer Mor D.O.   On: 03/18/2015 14:19     ASSESSMENT:  Pt is a 60 yo admitted after fall  NO definite cardiac history  Told "upper chambers beat faster than lower by primary MD"  No work up Today had frequent PACs and some PVCs/couplet  He is asympotmatic  Resolved  No atrial fib or flutter noted  I would recomm echo to evaluate LVfunction LA size If becomes symptomatic with palpitations could use low dose b blocker  Try to get outside records  Replete K and follow. Check TSH.   Will follow.  Dietrich Pates

## 2015-03-19 NOTE — Progress Notes (Signed)
Pt in afib with rate up to 140's nonsustained, pt asymptomatic. PA Jeffery notified. EKG ordered. Will continue to monitor.

## 2015-03-19 NOTE — Clinical Social Work Note (Signed)
Clinical Social Work Assessment  Patient Details  Name: Benjamin Bell MRN: [id number] Date of Birth: March 02, 1955  Date of referral:  03/19/15               Reason for consult:  Facility Placement, Trauma, Discharge Planning                Permission sought to share information with:  Family Supports, Customer service manager Permission granted to share information::  Yes, Verbal Permission Granted  Name::     Armed forces logistics/support/administrative officer::  SNFs  Relationship::     Contact Information:     Housing/Transportation Living arrangements for the past 2 months:  Apartment Source of Information:  Patient Patient Interpreter Needed:  None Criminal Activity/Legal Involvement Pertinent to Current Situation/Hospitalization:  No - Comment as needed Significant Relationships:  Spouse Lives with:  Spouse Do you feel safe going back to the place where you live?  Yes Need for family participation in patient care:  Yes (Comment)  Care giving concerns:  Patient in agreement with recommendation for continued rehab.   Social Worker assessment / plan:  CSW met with patient at bedside to complete assessment. Patient alert and oriented x4 at this time. The patient states that he was admitted to the hospital after experiencing a fall while walking his dog. The patient lives with his wife who he considers his main support. CSW discussed recommendation for CIR with patient and the patient is in agreement with pursuing this disposition option. CSW explained that SNF could be offered as an alternative. The patient is agreeable to SNF option being explored but states he would prefer CIR. CSW explained SNF search/placement process and answered the patient's questions. At this time the patient denies any symptoms associated with acute stress response. CSW inquired about patient's ETOH/substance use history. He states that his last drink was over 5 years ago. He denies any current ETOH/substance use concerns. CSW will followup  regarding any bed offers received for SNF. Report left for weekend CSW.  Employment status:  Disabled (Comment on whether or not currently receiving Disability) Insurance information:  Medicaid In [place] PT Recommendations:  [place] / Referral to community resources:  [place], SBIRT  Patient/Family's Response to care:  Patient appears to be pleased with the care he is receiving by trauma team. No complaints noted.  Patient/Family's Understanding of and Emotional Response to Diagnosis, Current Treatment, and Prognosis:  Patient appears to have a fair understanding of the reason for his admission and of his post DC needs. He appears to be coping well with hospitalization but looks forward to being able to return home once well.   Emotional Assessment Appearance:  Appears older than stated age Attitude/Demeanor/Rapport:  Other ([place] and appropriate) Affect (typically observed):  Accepting, Calm, Appropriate, Pleasant Orientation:  Oriented to Self, Oriented to Place, Oriented to Situation, Oriented to  Time Alcohol / Substance use:  Alcohol Use (Last drink was 5 year ago.) Psych involvement (Current and /or in the community):  No (Comment)  Discharge Needs  Concerns to be addressed:  Discharge Planning Concerns Readmission within the last 30 days:  No Current discharge risk:  Physical Impairment Barriers to Discharge:  Continued Medical Work up   [employer] MSW, [place], [place], [phone]

## 2015-03-19 NOTE — Progress Notes (Signed)
Patient ID: Benjamin Bell, male   DOB: [birth year], 60 y.o.   MRN: [id number]   LOS: 12 days   Subjective: Doing well, c/o only of scrotal swelling after paracentesis yesterday. York Spaniel this happened last time he had this procedure as well.   Objective: Vital signs in last 24 hours: Temp:  [97.2 F (36.2 C)-98.5 F (36.9 C)] 98.5 F (36.9 C) (01/20 0410) Pulse Rate:  [93-117] 93 (01/20 0410) Resp:  [6-15] 7 (01/20 0410) BP: (105-156)/(71-125) 126/72 mmHg (01/20 0410) SpO2:  [89 %-100 %] 90 % (01/20 0410) Weight:  [84.5 kg (186 lb 4.6 oz)-89.2 kg (196 lb 10.4 oz)] 84.5 kg (186 lb 4.6 oz) (01/20 0410) Last BM Date: 03/16/15   IS:   Laboratory  CBC  Recent Labs  03/17/15 0500 03/19/15 0510  WBC 7.0 6.7  HGB 11.1* 10.8*  HCT 34.4* 34.1*  PLT 167 165   BMET  Recent Labs  03/17/15 0500 03/19/15 0510  NA 136 136  K 4.0 3.4*  CL 102 100*  CO2 31 32  GLUCOSE 106* 81  BUN 28* 21*  CREATININE 0.81 0.88  CALCIUM 7.6* 7.6*    Physical Exam General appearance: alert and no distress Resp: wheezes LUL Cardio: regular rate and rhythm GI: Soft, +BS, NT Male genitalia: Scrotal edema, no ecchymosis   Assessment/Plan: Fall Multiple left rib fractures w/PTX - pulm toilet ABL anemia -- Mild, stable Right Upper Lobe PNA - D/C abx Acute encephalopathy - Improved Urinary retention - continue foley, will put on urecholine and Flomax, plan voiding trial Sunday Cirrhosis -- Paracentesis only yielded Chronic pain - home methadone  daily Pulmonary/adrenal/hepatic masses -- OP f/u AAA <5cm -- OP f/u Tobacco Abuse HTN  FEN - dysphagia 3 diet with nectar-thick liquids, on Lasix, ST following VTE- Lovenox and SCDs Dispo - Ok for tele, CIR when bed available    [name], PA-C Pager: [phone] General Trauma PA Pager: [phone]  03/19/2015

## 2015-03-20 ENCOUNTER — Inpatient Hospital Stay (HOSPITAL_COMMUNITY): Payer: Medicaid Other

## 2015-03-20 LAB — GLUCOSE, CAPILLARY
GLUCOSE-CAPILLARY: 142 mg/dL — AB (ref 65–99)
Glucose-Capillary: 160 mg/dL — ABNORMAL HIGH (ref 65–99)

## 2015-03-20 NOTE — Progress Notes (Signed)
Subjective: No real complaints today, getting echo  Objective: Vital signs in last 24 hours: Temp:  [98 F (36.7 C)-98.6 F (37 C)] 98.5 F (36.9 C) (01/21 0529) Pulse Rate:  [97-113] 97 (01/21 0529) Resp:  [11-21] 18 (01/21 0529) BP: (102-145)/(51-81) 133/60 mmHg (01/21 0529) SpO2:  [91 %-96 %] 96 % (01/21 0529) Weight:  [87.3 kg (192 lb 7.4 oz)-90.4 kg (199 lb 4.7 oz)] 87.3 kg (192 lb 7.4 oz) (01/21 0529) Last BM Date: 03/19/15  Intake/Output from previous day: 01/20 0701 - 01/21 0700 In: 660 [P.O.:660] Out: 1950 [Urine:1950] Intake/Output this shift:    General appearance: no distress Resp: diminished breath sounds bibasilar Cardio: regular rate and rhythm GI: mild distended bs present  Lab Results:   Recent Labs  03/19/15 0510  WBC 6.7  HGB 10.8*  HCT 34.1*  PLT 165   BMET  Recent Labs  03/19/15 0510  NA 136  K 3.4*  CL 100*  CO2 32  GLUCOSE 81  BUN 21*  CREATININE 0.88  CALCIUM 7.6*   PT/INR No results for input(s): LABPROT, INR in the last 72 hours. ABG No results for input(s): PHART, HCO3 in the last 72 hours.  Invalid input(s): PCO2, PO2  Studies/Results: US Paracentesis  03/18/2015  CLINICAL DATA:  Shortness of breath. Abdominal distention. Request for therapeutic paracentesis. EXAM: ULTRASOUND GUIDED PARACENTESIS COMPARISON:  None. PROCEDURE: An ultrasound guided paracentesis was thoroughly discussed with the patient and questions answered. The benefits, risks, alternatives and complications were also discussed. The patient understands and wishes to proceed with the procedure. Written consent was obtained. Ultrasound was performed and localized some ascites up over the dome of the liver and a small pocket of fluid in the left lower quadrant of the abdomen. The area was then prepped and draped in the normal sterile fashion. 1% Lidocaine was used for local anesthesia. Under ultrasound guidance a 19 gauge Yueh catheter was introduced.  Paracentesis was performed. The catheter was removed and a dressing applied. COMPLICATIONS: None immediate FINDINGS: A total of approximately 600 mL of hazy, yellow fluid was removed. A fluid sample was not sent for laboratory analysis. IMPRESSION: Successful ultrasound guided paracentesis yielding 600 mL of ascites. Read by: Brayton El PA-C Electronically Signed   By: Gilmer Mor D.O.   On: 03/18/2015 14:19    Anti-infectives: Anti-infectives    Start     Dose/Rate Route Frequency Ordered Stop   03/12/15 1300  cefTRIAXone (ROCEPHIN) 1 g in dextrose 5 % 50 mL IVPB  Status:  Discontinued     1 g 100 mL/hr over 30 Minutes Intravenous Every 24 hours 03/12/15 1130 03/19/15 0742   03/10/15 2000  vancomycin (VANCOCIN) IVPB 1000 mg/200 mL premix  Status:  Discontinued     1,000 mg 200 mL/hr over 60 Minutes Intravenous Every 8 hours 03/10/15 1100 03/11/15 1053   03/10/15 1100  vancomycin (VANCOCIN) 1,750 mg in sodium chloride 0.9 % 500 mL IVPB     1,750 mg 250 mL/hr over 120 Minutes Intravenous  Once 03/10/15 1054 03/10/15 1536   03/09/15 1800  ampicillin-sulbactam (UNASYN) 1.5 g in sodium chloride 0.9 % 50 mL IVPB  Status:  Discontinued     1.5 g 100 mL/hr over 30 Minutes Intravenous Every 6 hours 03/09/15 1725 03/12/15 1107      Assessment/Plan: Fall Multiple left rib fractures w/PTX - pulm toilet, ics Right Upper Lobe PNA - resolved Acute encephalopathy - Improved Urinary retention - continue foley, will put on urecholine and Flomax,  plan voiding trial Sunday Cirrhosis -- Paracentesis only yielded , continue follow Chronic pain - home methadone  daily Pulmonary/adrenal/hepatic masses -- OP f/u AAA <5cm -- OP f/u Tobacco Abuse Cards- echo pending today HTN  FEN - dysphagia 3 diet with nectar-thick liquids, on Lasix, ST following VTE- Lovenox and SCDs Dispo - CIR when bed available  [facility] 03/20/2015

## 2015-03-20 NOTE — NC FL2 (Signed)
MEDICAID FL2 LEVEL OF CARE SCREENING TOOL     IDENTIFICATION  Patient Name: Benjamin Bell Birthdate: 1955-03-02 Sex: male Admission Date (Current Location): 03/07/2015  [facility] and [place] Number:  Producer, television/film/video and Address:  [facility], [address], [place], [place] [address]      Provider Number: [id number]  Attending Physician Name and Address:  Trauma Md, MD  Relative Name and Phone Number:       Current Level of Care: Hospital Recommended Level of Care: Skilled Nursing Facility Prior Approval Number:    Date Approved/Denied:   PASRR Number:   [id number]  Discharge Plan: SNF    Current Diagnoses: Patient Active Problem List   Diagnosis Date Noted  . Acute blood loss anemia 03/19/2015  . Hepatic cirrhosis (HCC) 03/15/2015  . Pulmonary nodules 03/15/2015  . Adrenal mass (HCC) 03/15/2015  . AAA (abdominal aortic aneurysm) (HCC) 03/15/2015  . Urinary retention 03/15/2015  . Pneumonia 03/12/2015  . Hypernatremia 03/12/2015  . Acute respiratory failure following trauma and surgery (HCC) 03/09/2015  . Fall   . Traumatic hemopneumothorax   . Multiple rib fractures 03/07/2015    Orientation RESPIRATION BLADDER Height & Weight    Self, Time, Situation, Place  Tracheostomy, O2 (4L) Continent  (180.3 cm) 192 lbs.  BEHAVIORAL SYMPTOMS/MOOD NEUROLOGICAL BOWEL NUTRITION STATUS      Incontinent Diet (DYS 3 DIET)  AMBULATORY STATUS COMMUNICATION OF NEEDS Skin   Extensive Assist Verbally Other (Comment) (Stage 2 on scrotum with foam dressing. Stage 2 on perinuem with foam dressing)                       Personal Care Assistance Level of Assistance  Bathing, Dressing, Feeding Bathing Assistance: Maximum assistance Feeding assistance: Independent Dressing Assistance: Maximum assistance     Functional Limitations Info  Sight, Hearing, Speech Sight Info: Adequate Hearing Info: Adequate Speech Info: Adequate     SPECIAL CARE FACTORS FREQUENCY  PT (By licensed PT), OT (By licensed OT)                    Contractures      Additional Factors Info  Allergies, Code Status, Isolation Precautions Code Status Info: Full Allergies Info: NKDA     Isolation Precautions Info: Contact precautions for MRSA.     Current Medications (03/20/2015):  This is the current hospital active medication list Current Facility-Administered Medications  Medication Dose Route Frequency Provider Last Rate Last Dose  . albuterol (PROVENTIL) (2.5 MG/3ML) 0.083% nebulizer solution 2.5 mg  2.5 mg Nebulization Q2H PRN Coralyn Helling, MD   2.5 mg at 03/15/15 2300  . bethanechol (URECHOLINE) tablet 25 mg  25 mg Oral QID [name], PA-C   25 mg at 03/20/15 1109  . bisacodyl (DULCOLAX) suppository 10 mg  10 mg Rectal Daily PRN Almond Lint, MD      . chlorhexidine gluconate (PERIDEX) 0.12 % solution 15 mL  15 mL Mouth Rinse BID Manus Rudd, MD   15 mL at 03/19/15 2000  . feeding supplement (ENSURE ENLIVE) (ENSURE ENLIVE) liquid 237 mL  237 mL Oral BID BM Arlyss Gandy, RD   237 mL at 03/19/15 1400  . food thickener (THICK IT) powder   Oral PRN Violeta Gelinas, MD      . furosemide (LASIX) injection 20 mg  20 mg Intravenous BID [name], PA-C   20 mg at 03/20/15 5621  .  ipratropium-albuterol (DUONEB) 0.5-2.5 (3) MG/3ML nebulizer solution 3 mL  3 mL Nebulization Q6H PRN Violeta Gelinas, MD      . methadone (DOLOPHINE) tablet 70 mg  70 mg Oral Daily Darl Householder Masters, RPH   70 mg at 03/20/15 1109   And  . methadone (DOLOPHINE) 1 MG/1ML solution 1 mg  1 mg Oral Daily Darl Householder Masters, RPH   1 mg at 03/18/15 1032  . morphine 2 MG/ML injection 2 mg  2 mg Intravenous Q4H PRN [name], PA-C   2 mg at 03/19/15 0134  . naproxen (NAPROSYN) tablet 500 mg  500 mg Oral BID WC [name], PA-C   500 mg at 03/20/15 1610  . nicotine (NICODERM CQ - dosed in mg/24 hr) patch 7 mg  7 mg Transdermal Daily Coralyn Helling, MD   7 mg at 03/20/15 1111  . ondansetron (ZOFRAN) injection 4 mg  4 mg Intravenous Q6H PRN Harriette Bouillon, MD   4 mg at 03/18/15 0001  . oxyCODONE (Oxy IR/ROXICODONE) immediate release tablet 5-15 mg  5-15 mg Oral Q4H PRN [name], PA-C   15 mg at 03/20/15 9604  . potassium chloride SA (K-DUR,KLOR-CON) CR tablet 20 mEq  20 mEq Oral Daily Scarlett Presto, RPH   20 mEq at 03/20/15 1108  . RESOURCE THICKENUP CLEAR   Oral Once Alyson Reedy, MD      . senna-docusate (Senokot-S) tablet 1 tablet  1 tablet Oral BID Almond Lint, MD   1 tablet at 03/20/15 1109  . sodium chloride 0.9 % injection 10-40 mL  10-40 mL Intracatheter Q12H Jimmye Norman, MD   10 mL at 03/19/15 2200  . sodium chloride 0.9 % injection 10-40 mL  10-40 mL Intracatheter PRN Jimmye Norman, MD   20 mL at 03/20/15 1226  . tamsulosin (FLOMAX) capsule 0.8 mg  0.8 mg Oral Daily [name], PA-C   0.8 mg at 03/20/15 1108     Discharge Medications: Please see discharge summary for a list of discharge medications.  Relevant Imaging Results:  Relevant Lab Results:   Additional Information    Marnee Spring, LCSW

## 2015-03-20 NOTE — Progress Notes (Signed)
  Echocardiogram 2D Echocardiogram has been performed.  Leta Jungling M 03/20/2015, 10:31 AM

## 2015-03-21 DIAGNOSIS — I491 Atrial premature depolarization: Secondary | ICD-10-CM

## 2015-03-21 DIAGNOSIS — R188 Other ascites: Secondary | ICD-10-CM

## 2015-03-21 LAB — BASIC METABOLIC PANEL
Anion gap: 4 — ABNORMAL LOW (ref 5–15)
BUN: 18 mg/dL (ref 6–20)
CALCIUM: 7.6 mg/dL — AB (ref 8.9–10.3)
CO2: 36 mmol/L — AB (ref 22–32)
CREATININE: 0.92 mg/dL (ref 0.61–1.24)
Chloride: 101 mmol/L (ref 101–111)
Glucose, Bld: 108 mg/dL — ABNORMAL HIGH (ref 65–99)
Potassium: 3.5 mmol/L (ref 3.5–5.1)
SODIUM: 141 mmol/L (ref 135–145)

## 2015-03-21 NOTE — Progress Notes (Signed)
  Subjective: Patient comfortable - no complaints except penile/ scrotal swelling  Objective: Vital signs in last 24 hours: Temp:  [97.8 F (36.6 C)-98.4 F (36.9 C)] 97.8 F (36.6 C) (01/22 0501) Pulse Rate:  [94-102] 94 (01/22 0501) Resp:  [16-17] 17 (01/22 0501) BP: (121-154)/(58-97) 154/97 mmHg (01/22 0501) SpO2:  [93 %-96 %] 94 % (01/22 0501) Weight:  [85.3 kg (188 lb 0.8 oz)] 85.3 kg (188 lb 0.8 oz) (01/22 0501) Last BM Date: 03/20/15  Intake/Output from previous day: 01/21 0701 - 01/22 0700 In: 750 [P.O.:750] Out: 2000 [Urine:2000] Intake/Output this shift: Total I/O In: 161096.0 [I.V.:47410; IV Piggyback:283833.3] Out: -   Physical examiantion General appearance: no distress Resp: diminished breath sounds bibasilar Cardio: regular rate and rhythm GI: mild distended bs present GU:  Scrotal/ penile edema - Foley in place Lab Results:   Recent Labs  03/19/15 0510  WBC 6.7  HGB 10.8*  HCT 34.1*  PLT 165   BMET  Recent Labs  03/19/15 0510 03/21/15 0418  NA 136 141  K 3.4* 3.5  CL 100* 101  CO2 32 36*  GLUCOSE 81 108*  BUN 21* 18  CREATININE 0.88 0.92  CALCIUM 7.6* 7.6*   PT/INR No results for input(s): LABPROT, INR in the last 72 hours. ABG No results for input(s): PHART, HCO3 in the last 72 hours.  Invalid input(s): PCO2, PO2  Studies/Results: No results found.  Anti-infectives: Anti-infectives    Start     Dose/Rate Route Frequency Ordered Stop   03/12/15 1300  cefTRIAXone (ROCEPHIN) 1 g in dextrose 5 % 50 mL IVPB  Status:  Discontinued     1 g 100 mL/hr over 30 Minutes Intravenous Every 24 hours 03/12/15 1130 03/19/15 0742   03/10/15 2000  vancomycin (VANCOCIN) IVPB 1000 mg/200 mL premix  Status:  Discontinued     1,000 mg 200 mL/hr over 60 Minutes Intravenous Every 8 hours 03/10/15 1100 03/11/15 1053   03/10/15 1100  vancomycin (VANCOCIN) 1,750 mg in sodium chloride 0.9 % 500 mL IVPB     1,750 mg 250 mL/hr over 120 Minutes  Intravenous  Once 03/10/15 1054 03/10/15 1536   03/09/15 1800  ampicillin-sulbactam (UNASYN) 1.5 g in sodium chloride 0.9 % 50 mL IVPB  Status:  Discontinued     1.5 g 100 mL/hr over 30 Minutes Intravenous Every 6 hours 03/09/15 1725 03/12/15 1107      Assessment/Plan: Fall Multiple left rib fractures w/PTX - pulm toilet, ics Right Upper Lobe PNA - resolved Acute encephalopathy - Improved Urinary retention - continue foley, will put on urecholine and Flomax, continue foley for now until swelling decreases Cirrhosis -- Paracentesis only yielded , continue follow Chronic pain - home methadone  daily Pulmonary/adrenal/hepatic masses -- OP f/u AAA <5cm -- OP f/u Tobacco Abuse Cards- echo pending today HTN  FEN - dysphagia 3 diet with nectar-thick liquids, on Lasix, ST following VTE- Lovenox and SCDs Dispo - CIR when bed available  LOS: 14 days    Dj Senteno K. 03/21/2015

## 2015-03-21 NOTE — Progress Notes (Signed)
SUBJECTIVE:  Complains about ascites only.  No palpitations.  OBJECTIVE:   Vitals:   Filed Vitals:   03/20/15 0529 03/20/15 1327 03/20/15 2112 03/21/15 0501  BP: 133/60 121/58 136/70 154/97  Pulse: 97 99 102 94  Temp: 98.5 F (36.9 C) 98.4 F (36.9 C) 98.1 F (36.7 C) 97.8 F (36.6 C)  TempSrc: Oral Oral Oral Oral  Resp: 18 16 17 17   Height:      Weight: 192 lb 7.4 oz (87.3 kg)   188 lb 0.8 oz (85.3 kg)  SpO2: 96% 93% 96% 94%   I&O's:   Intake/Output Summary (Last 24 hours) at 03/21/15 1134 Last data filed at 03/21/15 1016  Gross per 24 hour  Intake 956213.08 ml  Output   2200 ml  Net 329793.34 ml   TELEMETRY: Reviewed telemetry pt in NSR with frequent PACs.  No AFib.  One strip labelled AFib but I think this  Represents PACs:     PHYSICAL EXAM General: Appears chronically ill Head:   Normal cephalic and atramatic  Lungs: no wheezing bilaterally to auscultation. Heart:   HRRR S1 S2  No JVD.   Abdomen: abdomen soft , distended Msk:  Back normal,  Normal strength and tone for age. Extremities:   SCDs in place   Neuro: Alert and oriented. Psych:  Normal affect, responds appropriately Skin: No rash   LABS: Basic Metabolic Panel:  Recent Labs  65/78/46 0510 03/21/15 0418  NA 136 141  K 3.4* 3.5  CL 100* 101  CO2 32 36*  GLUCOSE 81 108*  BUN 21* 18  CREATININE 0.88 0.92  CALCIUM 7.6* 7.6*   Liver Function Tests: No results for input(s): AST, ALT, ALKPHOS, BILITOT, PROT, ALBUMIN in the last 72 hours. No results for input(s): LIPASE, AMYLASE in the last 72 hours. CBC:  Recent Labs  03/19/15 0510  WBC 6.7  HGB 10.8*  HCT 34.1*  MCV 98.0  PLT 165   Cardiac Enzymes: No results for input(s): CKTOTAL, CKMB, CKMBINDEX, TROPONINI in the last 72 hours. BNP: Invalid input(s): POCBNP D-Dimer: No results for input(s): DDIMER in the last 72 hours. Hemoglobin A1C: No results for input(s): HGBA1C in the last 72 hours. Fasting Lipid Panel: No results  for input(s): CHOL, HDL, LDLCALC, TRIG, CHOLHDL, LDLDIRECT in the last 72 hours. Thyroid Function Tests:  Recent Labs  03/19/15 1950  TSH 3.590   Anemia Panel: No results for input(s): VITAMINB12, FOLATE, FERRITIN, TIBC, IRON, RETICCTPCT in the last 72 hours. Coag Panel:   Lab Results  Component Value Date   INR 1.45 03/08/2015    RADIOLOGY: Dg Abd 1 View  03/09/2015  CLINICAL DATA:  Nasogastric tube placement. EXAM: ABDOMEN - 1 VIEW COMPARISON:  None. FINDINGS: A feeding tube is identified traversing the stomach and duodenum with the tip near the expected position of the ligament of Treitz. A separate decompressive tube is not seen. There are multiple leads overlapping the region of the lower chest and it is unclear whether there may be a separate to in the distal esophagus. IMPRESSION: Feeding tube extends to the level of the ligament of Treitz. Separate gastric decompression tube is difficult to visualize, if present. If there is a separate tube present, recommend tube advancement and repeat of a film centered slightly higher to include more of the lower chest. Removal/ movement of monitoring lead wires would also be helpful. Electronically Signed   By: Irish Lack M.D.   On: 03/09/2015 20:05   US Paracentesis  03/18/2015  CLINICAL DATA:  Shortness of breath. Abdominal distention. Request for therapeutic paracentesis. EXAM: ULTRASOUND GUIDED PARACENTESIS COMPARISON:  None. PROCEDURE: An ultrasound guided paracentesis was thoroughly discussed with the patient and questions answered. The benefits, risks, alternatives and complications were also discussed. The patient understands and wishes to proceed with the procedure. Written consent was obtained. Ultrasound was performed and localized some ascites up over the dome of the liver and a small pocket of fluid in the left lower quadrant of the abdomen. The area was then prepped and draped in the normal sterile fashion. 1% Lidocaine was used for  local anesthesia. Under ultrasound guidance a 19 gauge Yueh catheter was introduced. Paracentesis was performed. The catheter was removed and a dressing applied. COMPLICATIONS: None immediate FINDINGS: A total of approximately 600 mL of hazy, yellow fluid was removed. A fluid sample was not sent for laboratory analysis. IMPRESSION: Successful ultrasound guided paracentesis yielding 600 mL of ascites. Read by: Brayton El PA-C Electronically Signed   By: Gilmer Mor D.O.   On: 03/18/2015 14:19   Dg Chest Port 1 View  03/16/2015  CLINICAL DATA:  Acute respiratory failure, history hypertension EXAM: PORTABLE CHEST 1 VIEW COMPARISON:  Portable exam 0604 hours compared to 03/15/2015 FINDINGS: Tip of RIGHT arm PICC line unchanged projecting over high RIGHT atrium. Endotracheal and feeding tubes no longer identified. Enlargement of cardiac silhouette with pulmonary vascular congestion. Scattered pulmonary infiltrates favor pulmonary edema. Associated moderate LEFT pleural effusion and basilar atelectasis. Tortuosity of thoracic aorta. No pneumothorax or focal bony abnormality. IMPRESSION: Enlargement of cardiac silhouette with pulmonary vascular congestion. Probable mild pulmonary edema, improved. Persistent LEFT pleural effusion and basilar atelectasis. Electronically Signed   By: Ulyses Southward M.D.   On: 03/16/2015 07:58   Dg Chest Port 1 View  03/15/2015  CLINICAL DATA:  Respiratory failure. EXAM: PORTABLE CHEST 1 VIEW COMPARISON:  March 14, 2015. FINDINGS: Stable cardiomediastinal silhouette. Endotracheal and feeding tubes are unchanged in position. Right-sided PICC line is noted in expected position of cavoatrial junction. No pneumothorax is noted. Stable central pulmonary vascular congestion is noted with probable bilateral perihilar and basilar edema which may be increased compared to prior exam. Increased mild left pleural effusion is noted. Bony thorax is unremarkable. IMPRESSION: Stable support  apparatus. There appears to be increased bilateral perihilar and basilar interstitial densities concerning for edema, with mildly increased left pleural effusion. Electronically Signed   By: Lupita Raider, M.D.   On: 03/15/2015 07:54   Dg Chest Port 1 View  03/14/2015  CLINICAL DATA:  Respiratory failure, shortness of breath, hypertension, smoker EXAM: PORTABLE CHEST 1 VIEW COMPARISON:  Portable exam 0629 hours compared to 03/13/2015 FINDINGS: Tip of endotracheal tube projects 4.1 cm above carina. Feeding tube extends into stomach. RIGHT arm PICC line tip projects over cavoatrial junction. Borderline enlargement of cardiac silhouette. Central peribronchial thickening with BILATERAL pulmonary infiltrates greatest in RIGHT upper lobe. Small LEFT pleural effusion and persistent LEFT lower lobe atelectasis. No definite pneumothorax though tips of lung apices are excluded. IMPRESSION: Bronchitic changes with persistent BILATERAL pulmonary infiltrates especially RIGHT upper lobe. Persistent LEFT lower lobe atelectasis and LEFT pleural effusion. Electronically Signed   By: Ulyses Southward M.D.   On: 03/14/2015 10:03   Dg Chest Port 1 View  03/13/2015  CLINICAL DATA:  Followup for acute respiratory failure. Intubated patient. EXAM: PORTABLE CHEST 1 VIEW COMPARISON:  03/12/2015 FINDINGS: Opacity has increased on the left since the prior study, now extending to the left mid  lung obscuring the left heart border well as the hemidiaphragm. Right upper lobe airspace interstitial opacity and right lower and middle lobe interstitial opacities are stable. There is no convincing pneumothorax on the current study. Endotracheal tube tip projects 1 cm above the carina. Enteric feeding tube passes below the diaphragm well into the stomach. There is subcutaneous air along the left lateral chest wall similar to prior exams. IMPRESSION: 1. Worsened lung aeration on the left when compared to the prior exam. This is likely combination of  increased pleural fluid with atelectasis. 2. Right lung, primarily right upper lobe, airspace and interstitial lung opacities without significant change from the previous day's study. Electronically Signed   By: Amie Portland M.D.   On: 03/13/2015 08:23   Dg Chest Port 1 View  03/12/2015  CLINICAL DATA:  Respiratory failure. EXAM: PORTABLE CHEST 1 VIEW COMPARISON:  03/11/2015.  03/10/2015.  03/09/2015 . FINDINGS: Endotracheal tube and feeding tube in stable position. Mediastinum and hilar structures normal. Partial clearing of right upper lobe infiltrate. Right upper lobe atelectatic changes. Low lung volumes with left lower lobe subsegmental atelectasis and or infiltrate. Small left pleural effusion. A subtle miniscule left apical pneumothorax again cannot be completely excluded. Partial clearing of left chest wall subcutaneous emphysema. IMPRESSION: 1. Lines and tubes in stable position. 2. Persistent but partially clearing right upper lobe infiltrate. Right upper lobe atelectatic changes are present . Left lower lobe subsegmental atelectasis and/or infiltrate. 3. Small left pleural effusion. 4. Partial clearing of left chest wall subcutaneous emphysema. A subtle miniscule left apical pneumothorax again cannot be completely excluded . Electronically Signed   By: Maisie Fus  Register   On: 03/12/2015 08:09   Dg Chest Port 1 View  03/11/2015  CLINICAL DATA:  Acute respiratory failure, pneumothorax, left rib fractures EXAM: PORTABLE CHEST 1 VIEW COMPARISON:  Portable chest x-ray dated March 10, 2015 FINDINGS: The lungs are adequately inflated. Confluent interstitial and alveolar infiltrate in the right upper lobe persists. The interstitial markings in both lower lobes are coarse but stable. There is extensive subcutaneous emphysema on the left. A discrete pleural line of a pneumothorax is not observed on the left. The heart is normal in size. The pulmonary vascularity is not engorged. A tiny left pleural effusion  is suspected. The endotracheal tube tip lies 4.6 cm above the carina. The feeding tube tip projects below the inferior margin of the image. IMPRESSION: Fairly stable appearance of the chest. An occult left-sided pneumothorax is likely present but not clearly demonstrated radiographically. A large amount of subcutaneous emphysema is present. Persistent pneumonia in the right upper lobe. The support tubes are in reasonable position. Electronically Signed   By: David  [place] M.D.   On: 03/11/2015 07:43   Dg Chest Port 1 View  03/10/2015  CLINICAL DATA:  60 year old male status post endotracheal tube placement. EXAM: PORTABLE CHEST 1 VIEW COMPARISON:  Chest radiograph dated 03/09/2015 FINDINGS: An endotracheal tube is seen with tip approximately 5 cm above the carina. And enteric tube extends into the left hemi abdomen with tip beyond the image cut off. A focal area of airspace opacity is noted in the right mid lung field. There is emphysematous changes of the lungs. No pleural effusion or pneumothorax. There is irregularity of the left posterior sixth rib concerning for a fracture. Clinical correlation is recommended. Left lateral chest wall soft tissue emphysema. IMPRESSION: Endotracheal tube above the carina. Emphysema with focal right mid lung field airspace opacity. Focal discontinuity of the left posterior  sixth rib concerning for fracture. Clinical correlation is recommended. Left chest wall soft tissue emphysema. No pneumothorax. Electronically Signed   By: Elgie Collard M.D.   On: 03/10/2015 06:20   Portable Chest Xray  03/09/2015  CLINICAL DATA:  Tachycardia, decreased oxygenation EXAM: PORTABLE CHEST 1 VIEW COMPARISON:  03/09/2015 FINDINGS: There is increasing right upper lobe consolidation. There is right lower lobe airspace disease. There is left lung interstitial thickening. There is an endotracheal tube with the tip 4.1 cm above the carina. There is no pleural effusion. There is a tiny left  apical pneumothorax. Soft tissue emphysema in the neck soft tissues. The heart and mediastinal contours are unremarkable. The osseous structures are unremarkable. IMPRESSION: 1. Increased right upper lobe consolidation and worsening right lower lobe airspace disease concerning for multilobar pneumonia. 2. Endotracheal tube with the tip 4.1 cm above the carina. 3. Tiny residual left apical pneumothorax. Subcutaneous emphysema in the neck soft tissues. Electronically Signed   By: Elige Ko   On: 03/09/2015 13:22   Dg Chest Port 1 View  03/09/2015  CLINICAL DATA:  Pneumothorax. EXAM: PORTABLE CHEST 1 VIEW COMPARISON:  03/08/2015 . FINDINGS: Mediastinum and hilar structures are stable. Stable cardiomegaly. No pulmonary venous congestion . Progressive right upper lobe infiltrate. Bibasilar atelectasis and/or mild infiltrates. Previously identified left apical pneumothorax is less well identified, mild residual cannot be excluded. Left chest wall subcutaneous emphysema again noted. Left rib fractures again noted. IMPRESSION: 1. Small left apical pneumothorax is less well identified on today's exam, mild residual cannot be excluded . 2. Progressive dense right upper lobe infiltrate. Persistent bibasilar atelectasis and/or mild infiltrates. 3. Subcutaneous emphysema left chest wall. Multiple left rib fractures again noted. 4. Stable cardiomegaly.  No pulmonary venous congestion. Electronically Signed   By: Maisie Fus  Register   On: 03/09/2015 07:45   Dg Chest Port 1 View  03/08/2015  CLINICAL DATA:  Pneumothorax EXAM: PORTABLE CHEST 1 VIEW COMPARISON:  03/07/2015 FINDINGS: 5% left apical pneumothorax is slightly larger. Emphysema over the left chest wall is stable. Stable appearance of the lungs and heart. IMPRESSION: Left pneumothorax has slightly enlarged but remains small at 5%. Electronically Signed   By: Jolaine Click M.D.   On: 03/08/2015 12:18   Dg Chest Port 1 View  03/07/2015  CLINICAL DATA:  Shortness of  breath EXAM: PORTABLE CHEST 1 VIEW COMPARISON:  03/07/2015 FINDINGS: Moderate cardiac enlargement stable. Mild perihilar opacity on the right. More prominent retrocardiac left lower lobe opacity. Subcutaneous emphysema on the left. Small left apical pneumothorax. Small volume pneumomediastinum. IMPRESSION: Findings again consistent with left lower lobe contusion, small left pneumothorax, subcutaneous emphysema. Electronically Signed   By: Esperanza Heir M.D.   On: 03/07/2015 21:10   Dg Chest Port 1 View  03/07/2015  CLINICAL DATA:  Left-sided pneumothorax. EXAM: PORTABLE CHEST 1 VIEW COMPARISON:  CT of the chest 03/07/2015 FINDINGS: Cardiomediastinal silhouette is normal considering portable technique. There is left apical pneumothorax and small pneumomediastinum. There is no evidence of mediastinal shift. Left lower lobe lung contusion presents as a patchy airspace consolidation. Left-sided rib fractures are again seen. Healed right-sided rib fractures are noted. There is left chest wall emphysema. IMPRESSION: Small left apical pneumothorax and small left pneumomediastinum. Left lower lobe lung contusion. Posttraumatic left chest wall emphysema. Electronically Signed   By: Ted Mcalpine M.D.   On: 03/07/2015 10:46   Dg Chest Port 1 View  03/07/2015  CLINICAL DATA:  Status post fall, with chest deformity. Level 1 trauma.  Initial encounter. EXAM: PORTABLE CHEST 1 VIEW COMPARISON:  None. FINDINGS: The lungs are well-aerated. A trace left-sided pneumothorax is suggested. Multiple left-sided rib fractures are seen, with overlying soft tissue air. Left basilar opacity may reflect atelectasis or pulmonary parenchymal contusion. Underlying vascular congestion is noted. No definite pleural effusion is seen. The cardiomediastinal silhouette is mildly enlarged. No acute osseous abnormalities are identified. IMPRESSION: 1. Suggestion of trace left-sided pneumothorax. 2. Multiple left-sided rib fractures, with  overlying soft tissue air. 3. Left basilar airspace opacity may reflect atelectasis or pulmonary parenchymal contusion. 4. Underlying vascular congestion and mild cardiomegaly. These results were called by telephone at the time of interpretation on 03/07/2015 at 3:28 am to Dr. Tomasita Crumble, who verbally acknowledged these results. Electronically Signed   By: Roanna Raider M.D.   On: 03/07/2015 03:29   Dg Cerv Spine 3 Or Less V Flex And Ext Only  03/07/2015  CLINICAL DATA:  Patient fell on ice while walking a dog, and neck pain EXAM: CERVICAL SPINE - FLEXION AND EXTENSION VIEWS ONLY COMPARISON:  CT scan performed 03/07/2015 FINDINGS: Mild anterior listhesis with flexion of C2 on C3 and C3 on C4. Listhesis is about 4 mm at C2-3 and 4 mm at C3-4. Overlying soft tissues obscure disc spaces below C4-5. With extension, there remains mild anterior listhesis of C2 on C3 of about 4 mm. The listhesis of C3 on C4 resolves. IMPRESSION: Mild anterior listhesis of C2 on C3 unchanged between flexion and extension positioning. Mild anterior listhesis of C3 on C4 with flexion that resolves with extension. This can be seen with ligamentous injury or laxity. Soft tissues obscure visualization below the C4-5 level. Electronically Signed   By: Esperanza Heir M.D.   On: 03/07/2015 15:39      ASSESSMENT: PACs  PLAN:  Echo reviewed. Left Atrial Enlargement-mild. Normal LV function. No evidence of AFib on tele.  Not a good anticoagulation candidate due to liver disease.  No sx.  Continue conservative management.  Could consider low  Dose beta blocker if palpitations worsen or if it is indicated for esophageal varices.   Ascites per primary team.   Will sign off.  Please call with questions.  Corky Crafts, MD  03/21/2015  11:34 AM

## 2015-03-22 DIAGNOSIS — L8990 Pressure ulcer of unspecified site, unspecified stage: Secondary | ICD-10-CM | POA: Insufficient documentation

## 2015-03-22 NOTE — Progress Notes (Signed)
Speech Language Pathology Treatment: Dysphagia;Cognitive-Linquistic  Patient Details Name: Benjamin Bell MRN: [id number] DOB: 11/18/1955 Today's Date: 03/22/2015 Time: 4098-1191 SLP Time Calculation (min) (ACUTE ONLY): 20 min  Assessment / Plan / Recommendation Clinical Impression  SLP provided environmental modifications to facilitate attention, with Min cues for sustained attention to self-feeding task in a quiet environment. He has good recall of daily events when asked. Pt has immediate coughing with straw sips of thin liquids, which is eliminated with SLP intervention for straw removal and Mod cues for smaller sips. Will continue Dys 3 diet due to prolonged bolus formation given lack of dentition, but will advance liquids to thin by cup. Continue to recommend CIR.   HPI HPI: 60 yo with hx of COPD, HTN, and Chronic Pain requiring Methadone. s/p fall with resulting multiple L sided fractures; ant pneumothorax on L. Develpoed respiratory distress. Intubated 03/09/15 - 03/15/15. Acute encephalopathy.       SLP Plan  Continue with current plan of care     Recommendations  Diet recommendations: Dysphagia 3 (mechanical soft);Thin liquid Liquids provided via: Cup;No straw Medication Administration: Whole meds with puree Supervision: Patient able to self feed;Full supervision/cueing for compensatory strategies Compensations: Slow rate;Small sips/bites Postural Changes and/or Swallow Maneuvers: Seated upright 90 degrees             Oral Care Recommendations: Oral care BID Follow up Recommendations: Inpatient Rehab Plan: Continue with current plan of care     GO               Maxcine Ham, M.A. CCC-SLP [phone]  Maxcine Ham 03/22/2015, 2:38 PM

## 2015-03-22 NOTE — Progress Notes (Signed)
Nutrition Follow-up  DOCUMENTATION CODES:   Not applicable  INTERVENTION:   -D/c Ensure Enlive due to poor acceptance -Magic Cup TID with meals  NUTRITION DIAGNOSIS:   Inadequate oral intake related to dysphagia as evidenced by meal completion < 50%.  Progressing  GOAL:   Patient will meet greater than or equal to 90% of their needs  Progressing  MONITOR:   PO intake, Supplement acceptance, Diet advancement, Skin, I & O's  REASON FOR ASSESSMENT:   Consult Enteral/tube feeding initiation and management  ASSESSMENT:   60 yo male smoker fell and had Lt rib fx with Lt PTX. Developed progressive respiratory failure and required intubation.  Pt extubated on 03/18/15. He transferred from ICU/SDU to surgical floor on 03/19/15.  Pt was placed on a dysphagia 3 diet with nectar thick liquids on 03/18/15. Pt reports good tolerance of diet, with fair appetite. Meal completion 85-100%. Pt reports he does not like the Ensure supplements because they are "too sweet". He reports sweet foods are difficult to tolerate when he has to wash them down with nectar thick water. Offered Glucerna supplement, as pt revealed he was trying to watch his sugar intake, however, he politely declined. Per MD notes, SLP to re-eval for diet upgrade.   Therapy following; recommending CIR placement.   Labs reviewed.   Diet Order:  DIET DYS 3 Room service appropriate?: Yes; Fluid consistency:: Nectar Thick  Skin:  Wound (see comment) (MASD sacrum and scrotum)  Last BM:  03/21/15  Height:   Ht Readings from Last 1 Encounters:  03/19/15  (1.803 m)    Weight:   Wt Readings from Last 1 Encounters:  03/22/15 183 lb 6.8 oz (83.2 kg)    Ideal Body Weight:  78.1 kg  BMI:  Body mass index is 25.59 kg/(m^2).  Estimated Nutritional Needs:   Kcal:  2000-2200  Protein:  105-115 grams  Fluid:  > 2 L/day  EDUCATION NEEDS:   No education needs identified at this time  Hillarie Harrigan A. Mayford Knife,  RD, LDN, CDE Pager: [phone] After hours Pager: [phone]

## 2015-03-22 NOTE — Progress Notes (Signed)
Rehab admissions - Patient was found to be an appropriate candidate for inpatient rehab admission.  However, we have no beds available today and only limited beds open tomorrow.  If no bed available once patient is medically ready, then will need to pursue SNF for rehab.  Call me for questions.  #[phone]

## 2015-03-22 NOTE — Progress Notes (Signed)
[facility] Progress Note     Subjective: Patient comfortable in bed. Reports no changes from yesterday. Pain controlled. Some pain over his left rib cage in certain positions but he reports that it is much improved. Tolerating diet. Expresses that he still has scrotal swelling, and this has happened before, the first time being after his hernia repair years ago.   Objective: Vital signs in last 24 hours: Temp:  [97.8 F (36.6 C)-98.4 F (36.9 C)] 98.4 F (36.9 C) (01/23 0546) Pulse Rate:  [85-106] 85 (01/23 0546) Resp:  [17-18] 17 (01/23 0546) BP: (126-137)/(58-77) 137/60 mmHg (01/23 0546) SpO2:  [92 %-95 %] 95 % (01/23 0546) Weight:  [83.2 kg (183 lb 6.8 oz)] 83.2 kg (183 lb 6.8 oz) (01/23 0546) Last BM Date: 03/21/15  Intake/Output from previous day: 01/22 0701 - 01/23 0700 In: 161096.0 [P.O.:1530; I.V.:47410; IV Piggyback:283833.3] Out: 2200 [Urine:2200] Intake/Output this shift:    PE: Gen:  Alert, NAD, pleasant Card:  RRR, no M/G/R heard Pulm:  CTA, no W/R/R; diminished breath sounds in BL lung bases Abd: mild distention, +BS GU: scrotal edema, penile edema present but improved from last week; foley in place Ext:  No erythema, edema, or tenderness. Pedal pulses 2+ BL  Lab Results:  No results for input(s): WBC, HGB, HCT, PLT in the last 72 hours. BMET  Recent Labs  03/21/15 0418  NA 141  K 3.5  CL 101  CO2 36*  GLUCOSE 108*  BUN 18  CREATININE 0.92  CALCIUM 7.6*   PT/INR No results for input(s): LABPROT, INR in the last 72 hours. CMP     Component Value Date/Time   NA 141 03/21/2015 0418   K 3.5 03/21/2015 0418   CL 101 03/21/2015 0418   CO2 36* 03/21/2015 0418   GLUCOSE 108* 03/21/2015 0418   BUN 18 03/21/2015 0418   CREATININE 0.92 03/21/2015 0418   CALCIUM 7.6* 03/21/2015 0418   PROT 4.8* 03/11/2015 0043   ALBUMIN 1.6* 03/11/2015 0043   AST 71* 03/11/2015 0043   ALT 26 03/11/2015 0043   ALKPHOS 97 03/11/2015 0043   BILITOT 0.6  03/11/2015 0043   GFRNONAA >60 03/21/2015 0418   GFRAA >60 03/21/2015 0418   Lipase     Component Value Date/Time   LIPASE 39 03/07/2015 0321       Studies/Results: No results found.  Anti-infectives: Anti-infectives    Start     Dose/Rate Route Frequency Ordered Stop   03/12/15 1300  cefTRIAXone (ROCEPHIN) 1 g in dextrose 5 % 50 mL IVPB  Status:  Discontinued     1 g 100 mL/hr over 30 Minutes Intravenous Every 24 hours 03/12/15 1130 03/19/15 0742   03/10/15 2000  vancomycin (VANCOCIN) IVPB 1000 mg/200 mL premix  Status:  Discontinued     1,000 mg 200 mL/hr over 60 Minutes Intravenous Every 8 hours 03/10/15 1100 03/11/15 1053   03/10/15 1100  vancomycin (VANCOCIN) 1,750 mg in sodium chloride 0.9 % 500 mL IVPB     1,750 mg 250 mL/hr over 120 Minutes Intravenous  Once 03/10/15 1054 03/10/15 1536   03/09/15 1800  ampicillin-sulbactam (UNASYN) 1.5 g in sodium chloride 0.9 % 50 mL IVPB  Status:  Discontinued     1.5 g 100 mL/hr over 30 Minutes Intravenous Every 6 hours 03/09/15 1725 03/12/15 1107       Assessment/Plan Fall Multiple left rib fractures w/PTX - pulm toilet, ics Right Upper Lobe PNA - resolved Acute encephalopathy - Improved Urinary retention -  on urecholine and Flomax, continue foley for now until swelling decreases Cirrhosis -- Paracentesis only yielded , continue to follow  Chronic pain - home methadone  daily Pulmonary/adrenal/hepatic masses -- OP f/u AAA <5cm -- OP f/u Tobacco Abuse Cards- signed off; echo revealed mild left atrial enlargement, normal EF, no signs of A.fib. Could consider low dose beta blocker for palpitations. HTN  FEN - dysphagia 3 diet with nectar-thick liquids, on Lasix, ST following VTE- SCDs; Cards states patient is not a good candidate for anticoagulation due to liver dz Dispo - SNF when bed available   LOS: 15 days   Hosie Spangle PA-Student [facility]  03/22/2015, 7:36 AM Pager: [phone]

## 2015-03-22 NOTE — Progress Notes (Signed)
Physical Therapy Treatment Patient Details Name: Benjamin Bell MRN: [id number] DOB: 14-Jan-1956 Today's Date: 03/22/2015    History of Present Illness 60 yo with hx of COPD, HTN, and Chronic Pain requiring Methadone.  s/p fall with resulting multiple L sided fractures; ant pneumothorax on L. Develpoed respiratory distress. Intubated 03/09/15 - 03/15/15. Acute encephalopathy.     PT Comments    Noting very good progress with activity tolerance; Able to initiate gait training; Desats on Room Air pretty dramatically  Follow Up Recommendations  CIR     Equipment Recommendations  None recommended by PT    Recommendations for Other Services Rehab consult     Precautions / Restrictions Precautions Precautions: Fall    Mobility  Bed Mobility Overal bed mobility: Needs Assistance Bed Mobility: Supine to Sit     Supine to sit: Min assist     General bed mobility comments: Began with cues to initiate with log roll, however then pt asked if he could "just get up"; I agreed, and then he was able to clear LEs from EOB, and use rails to turn his body and push up to sit  Transfers Overall transfer level: Needs assistance Equipment used: Rolling walker (2 wheeled) Transfers: Sit to/from Stand Sit to Stand: Mod assist;+2 safety/equipment         General transfer comment: Cues for hand placement and safety; mod assist to rise, but pt able to acheive fully upright standing with verbal and tactile cues  Ambulation/Gait Ambulation/Gait assistance: +2 safety/equipment;Mod assist Ambulation Distance (Feet): 20 Feet Assistive device: Rolling walker (2 wheeled) Gait Pattern/deviations: Step-through pattern;Decreased step length - right;Decreased step length - left;Decreased stride length;Trunk flexed Gait velocity: quite slow   General Gait Details: Cues for upright posture and light mod assist for RW steer and management; cues to self monitor for activity tolerance; O2 sat walk done; see  other note for details   Stairs            Wheelchair Mobility    Modified Rankin (Stroke Patients Only)       Balance     Sitting balance-Leahy Scale: Fair Sitting balance - Comments: Utilizes UEs for balance support and pain relief in L ribs.     Standing balance-Leahy Scale: Poor                      Cognition Arousal/Alertness: Awake/alert Behavior During Therapy: WFL for tasks assessed/performed Overall Cognitive Status: Within Functional Limits for tasks assessed (for simple mobility activities)                      Exercises      General Comments        Pertinent Vitals/Pain Pain Assessment: Faces Faces Pain Scale: Hurts a little bit Pain Location: L lower ribs Pain Descriptors / Indicators: Aching Pain Intervention(s): Monitored during session    Home Living                      Prior Function            PT Goals (current goals can now be found in the care plan section) Acute Rehab PT Goals Patient Stated Goal: wife - pt to get better PT Goal Formulation: With family Time For Goal Achievement: 04/13/15 Potential to Achieve Goals: Good Progress towards PT goals: Progressing toward goals (Good progress, updated goals appropriately)    Frequency  Min 3X/week    PT Plan Current plan remains  appropriate    Co-evaluation             End of Session Equipment Utilized During Treatment: Gait belt;Oxygen Activity Tolerance: Patient tolerated treatment well Patient left: in chair;with call bell/phone within reach     Time: 1131-1159 PT Time Calculation (min) (ACUTE ONLY): 28 min  Charges:  $Gait Training: 8-22 mins $Therapeutic Activity: 8-22 mins                    G Codes:      Olen Pel 03/22/2015, 1:09 PM  Van Clines, [place]  Acute Rehabilitation Services Pager [phone] Office [phone]

## 2015-03-22 NOTE — Progress Notes (Signed)
Physical Therapy Treatment Note  Clinical Impression: SATURATION QUALIFICATIONS: (This note is used to comply with regulatory documentation for home oxygen)  Patient Saturations on Room Air at Rest = 90%  Patient Saturations on Room Air while Ambulating = 85%  Patient Saturations on 4 Liters of oxygen while Ambulating = 91%  Please briefly explain why patient needs home oxygen: Patient requires supplemental oxygen to maintain oxygen saturations at acceptable, safe levels with physical activity.   See also other PT note of this date for other details of session;  Thanks,  Van Clines, [place]  Acute Rehabilitation Services Pager [phone] Office [phone]

## 2015-03-23 MED ORDER — METHADONE HCL 10 MG/5ML PO SOLN: 71.0000 mg | Freq: Every day | ORAL | Status: DC

## 2015-03-23 MED ORDER — NAPROXEN 500 MG PO TABS: 500.0000 mg | ORAL_TABLET | Freq: Two times a day (BID) | ORAL | Status: DC

## 2015-03-23 MED ORDER — OXYCODONE-ACETAMINOPHEN 7.5-325 MG PO TABS
1.0000 | ORAL_TABLET | ORAL | Status: DC | PRN
Start: 2015-03-23 — End: 2015-03-24

## 2015-03-23 NOTE — Progress Notes (Signed)
Speech Language Pathology Treatment: Dysphagia  Patient Details Name: Benjamin Bell MRN: [id number] DOB: Apr 25, 1955 Today's Date: 03/23/2015 Time: 4098-1191 SLP Time Calculation (min) (ACUTE ONLY): 13 min  Assessment / Plan / Recommendation Clinical Impression  SLP provided skilled observation during PO intake of regular textures and thin liquids via straw. Pt needed Min verbal cues for smaller sip size, with no overt s/s of aspiration observed, even with mixed consistencies. He does still have slow mastication due to missing dentition, and uses liquid washes with Mod I to facilitate oral preparation. Recommend to continue current diet, allowing use of straws. SLP to f/u briefly for diet tolerance. He will need continued f/u for cognitive goals with CIR recommended.   HPI HPI: 60 yo with hx of COPD, HTN, and Chronic Pain requiring Methadone. s/p fall with resulting multiple L sided fractures; ant pneumothorax on L. Develpoed respiratory distress. Intubated 03/09/15 - 03/15/15. Acute encephalopathy.       SLP Plan  Continue with current plan of care     Recommendations  Diet recommendations: Dysphagia 3 (mechanical soft);Thin liquid Liquids provided via: Cup;Straw Medication Administration: Whole meds with puree Supervision: Patient able to self feed;Full supervision/cueing for compensatory strategies Compensations: Slow rate;Small sips/bites Postural Changes and/or Swallow Maneuvers: Seated upright 90 degrees             Oral Care Recommendations: Oral care BID Follow up Recommendations: Inpatient Rehab;24 hour supervision/assistance Plan: Continue with current plan of care     GO               Maxcine Ham, M.A. CCC-SLP [phone]  Maxcine Ham 03/23/2015, 10:11 AM

## 2015-03-23 NOTE — Progress Notes (Signed)
Physical Therapy Treatment Patient Details Name: Benjamin Bell MRN: [id number] DOB: [birth year] Today's Date: 03/23/2015    History of Present Illness 60 yo with hx of COPD, HTN, and Chronic Pain requiring Methadone.  s/p fall with resulting multiple L sided fractures; ant pneumothorax on L. Develpoed respiratory distress. Intubated 03/09/15 - 03/15/15. Acute encephalopathy.     PT Comments    Pt making good progress towards goals showing increased independence with bed mobility and ambulating. Tolerated walking w/ Min Guard 131ft with RW and SpO2. Attempted to start ambulating w/o SpO2 yet pt dropped to low 90s upon standing EOB. If pt remains in the hospital overnight possibility of returning home if he continues to show improvement and completes the stairs. Otherwise, recommending ST-SNF to maximize mobility and self-management of O2 for safe transition home.   Follow Up Recommendations  SNF     Equipment Recommendations  Rolling walker with 5" wheels    Recommendations for Other Services       Precautions / Restrictions Precautions Precautions: Fall Precaution Comments: Contact Restrictions Weight Bearing Restrictions: No    Mobility  Bed Mobility Overal bed mobility: Modified Independent Bed Mobility: Supine to Sit Rolling: Modified independent (Device/Increase time)   Supine to sit: Modified independent (Device/Increase time)     General bed mobility comments: Achieved sitting EOB with use of bed rail to initiate righting trunk  Transfers Overall transfer level: Needs assistance Equipment used: Rolling walker (2 wheeled) Transfers: Sit to/from Stand Sit to Stand: Min assist         General transfer comment: Required two attempts. Pt successful on 2nd try with initial Min A. Slight pause halfway to full standing to steady himself. O2 dropped from 97 to 88 on RA.  Ambulation/Gait Ambulation/Gait assistance: Min guard Ambulation Distance (Feet): 150 Feet Assistive  device: Rolling walker (2 wheeled) Gait Pattern/deviations: Step-through pattern Gait velocity: decreased Gait velocity interpretation: Below normal speed for age/gender General Gait Details: On 4L O2 stable at 93% t/o ambulating. Cues for activtiy tolerance aand breathing technique   Stairs            Wheelchair Mobility    Modified Rankin (Stroke Patients Only)       Balance Overall balance assessment: Needs assistance Sitting-balance support: No upper extremity supported Sitting balance-Leahy Scale: Good     Standing balance support: Bilateral upper extremity supported Standing balance-Leahy Scale: Fair                      Cognition Arousal/Alertness: Awake/alert Behavior During Therapy: WFL for tasks assessed/performed Overall Cognitive Status: Within Functional Limits for tasks assessed Area of Impairment: Safety/judgement         Safety/Judgement: Decreased awareness of deficits;Decreased awareness of safety          Exercises      General Comments        Pertinent Vitals/Pain Pain Assessment: Faces Faces Pain Scale: Hurts a little bit Pain Location: L rib pain Pain Intervention(s): Monitored during session    Home Living                      Prior Function            PT Goals (current goals can now be found in the care plan section) Acute Rehab PT Goals Patient Stated Goal: I want to go home Progress towards PT goals: Progressing toward goals    Frequency  Min 3X/week    PT Plan Current  plan remains appropriate    Co-evaluation             End of Session Equipment Utilized During Treatment: Gait belt;Oxygen Activity Tolerance: Patient tolerated treatment well Patient left: in chair;with call bell/phone within reach     Time: 1051-1115 PT Time Calculation (min) (ACUTE ONLY): 24 min  Charges:  $Gait Training: 23-37 mins                    G Codes:      Ulyses Jarred Mar 31, 2015, 3:37 PM  Ulyses Jarred, Student Physical Therapist Acute Rehab [phone]

## 2015-03-23 NOTE — Clinical Social Work Note (Signed)
Met with patient to facilitate discharge to [facility]. The patient is stating that he will not be agreeable to placement at this time. He states that he plans to return home with his wife. Althea Charon, facility that had offered the patient an LOG bed has rescinded their offer because [facility] would not send a home health nurse home with the patient when the patient is discharged from SNF. Only alternative is a [facility] SNF but the patient will not entertain this option. Patient is aware that he will receive no physical therapy at home and he verbalizes his understanding of this. RNCM updated on the patient. CSW signing off.

## 2015-03-23 NOTE — Progress Notes (Signed)
Patient ID: Benjamin Bell, male   DOB: [birth year], 60 y.o.   MRN: [id number]    Subjective: Sitting up, working with OT  Objective: Vital signs in last 24 hours: Temp:  [98.2 F (36.8 C)-98.6 F (37 C)] 98.2 F (36.8 C) (01/24 0428) Pulse Rate:  [88-107] 107 (01/24 0428) Resp:  [18] 18 (01/24 0428) BP: (125-148)/(64-83) 144/74 mmHg (01/24 0428) SpO2:  [94 %-98 %] 94 % (01/24 0428) Weight:  [84.8 kg (186 lb 15.2 oz)] 84.8 kg (186 lb 15.2 oz) (01/24 0428) Last BM Date: 03/21/15  Intake/Output from previous day: 01/23 0701 - 01/24 0700 In: 1080 [P.O.:1080] Out: 3075 [Urine:3075] Intake/Output this shift:    General appearance: cooperative Resp: clear to auscultation bilaterally Cardio: regular rate and rhythm GI: soft, a little less distended  Penile edema persists  Lab Results: CBC  No results for input(s): WBC, HGB, HCT, PLT in the last 72 hours. BMET  Recent Labs  03/21/15 0418  NA 141  K 3.5  CL 101  CO2 36*  GLUCOSE 108*  BUN 18  CREATININE 0.92  CALCIUM 7.6*   PT/INR No results for input(s): LABPROT, INR in the last 72 hours. ABG No results for input(s): PHART, HCO3 in the last 72 hours.  Invalid input(s): PCO2, PO2  Studies/Results: No results found.  Anti-infectives: Anti-infectives    Start     Dose/Rate Route Frequency Ordered Stop   03/12/15 1300  cefTRIAXone (ROCEPHIN) 1 g in dextrose 5 % 50 mL IVPB  Status:  Discontinued     1 g 100 mL/hr over 30 Minutes Intravenous Every 24 hours 03/12/15 1130 03/19/15 0742   03/10/15 2000  vancomycin (VANCOCIN) IVPB 1000 mg/200 mL premix  Status:  Discontinued     1,000 mg 200 mL/hr over 60 Minutes Intravenous Every 8 hours 03/10/15 1100 03/11/15 1053   03/10/15 1100  vancomycin (VANCOCIN) 1,750 mg in sodium chloride 0.9 % 500 mL IVPB     1,750 mg 250 mL/hr over 120 Minutes Intravenous  Once 03/10/15 1054 03/10/15 1536   03/09/15 1800  ampicillin-sulbactam (UNASYN) 1.5 g in sodium chloride 0.9 % 50 mL IVPB   Status:  Discontinued     1.5 g 100 mL/hr over 30 Minutes Intravenous Every 6 hours 03/09/15 1725 03/12/15 1107      Assessment/Plan: Fall Multiple left rib fractures w/PTX - pulm toilet, IS Right Upper Lobe PNA - resolved Acute encephalopathy - Improved Urinary retention - on urecholine and Flomax, continue foley for now until swelling decreases Cirrhosis -- Paracentesis only yielded , continue to follow  Chronic pain - home methadone  daily Pulmonary/adrenal/hepatic masses -- OP f/u AAA <5cm -- OP f/u Tobacco Abuse HTN  FEN - dysphagia 3 diet with thin liquids now VTE- SCDs; Cards states patient is not a good candidate for anticoagulation due to liver dz Dispo - SNF when bed available. Patient is concerned about cost associated with SNF.   LOS: 16 days    Violeta Gelinas, MD, MPH, FACS Trauma: [phone] General Surgery: [phone]  03/23/2015

## 2015-03-23 NOTE — Progress Notes (Signed)
Rehab admissions - No rehab bed available over the next few days for this patient.  Agree with need to pursue SNF placement since rehab has no beds.  Call me for questions.  #[phone]

## 2015-03-23 NOTE — Discharge Summary (Signed)
[facility] Discharge Summary   Patient ID: Benjamin Bell MRN: [id number] DOB/AGE: Mar 28, 1955 60 y.o.  Admit date: 03/07/2015 Discharge date: 03/23/2015  Admitting Diagnosis: Fall Multiple rib fractures, Left Pneumonthorax, Left COPD Heavy tobacco use  Discharge Diagnosis Patient Active Problem List   Diagnosis Date Noted  . Pressure ulcer 03/22/2015  . Acute blood loss anemia 03/19/2015  . Hepatic cirrhosis (HCC) 03/15/2015  . Pulmonary nodules 03/15/2015  . Adrenal mass (HCC) 03/15/2015  . AAA (abdominal aortic aneurysm) (HCC) 03/15/2015  . Urinary retention 03/15/2015  . Pneumonia 03/12/2015  . Hypernatremia 03/12/2015  . Acute respiratory failure following trauma and surgery (HCC) 03/09/2015  . Fall   . Traumatic hemopneumothorax   . Multiple rib fractures 03/07/2015   Consultants Luci Bank, NP-C (Critical Care)  Dr. Riley Kill (Physical Medicine) Dr. Tenny Craw (Cardiology)   Imaging: 03/07/15: CT CHEST W/ CONTRAST- multiple left sided rib fractures, anterior pneumothorax on the left, subcutaneous emphysema over the left chest wall. 03/08/14: DG CHEST - progressive dense right upper lobe infiltrate, small left apical pneumothorax, subcutaneous emphysema 03/09/14: DG CHEST - endotracheal tube stable, enteric tube in good position, no pneumothorax  Procedures Dr. Delton Coombes (03/09/15) - Intubation Manson Passey, RRT (03/15/15) - Extubation Bruning, PA-C (03/18/2015) - Ultrasound guided paracentesis; 600 mL hazy, yellow fluid removed.  Hospital Course:  Presentation and admission: 60 year old while male who presented to [facility] via EMS on 03/07/15 after falling on ice while walking his dog at 3:00am. His medical history is significant for chronic back pain on methadone, severe COPD, cirrhosis, and a history of a right pneumothorax requiring chest tube placement. He was in a c-collar on arrival to ED. The patients' main complaints on arrival were left-sided chest pain and neck pain.  Initial BP upon arrival was 80/50 but a 300 ml bolus of normal saline normalized his blood pressure. Physical exam significant for decreased breath sounds, wheezes in the left lower lung field, and chest wall tenderness. CT significant for the above findings. The patient was hemodynamically stable with normal oxygen saturations and was transferred to the ICU for pulmonary monitoring and pain control.   Patients c-spine was cleared on 03/08/15. The patient had trouble maintaining oxygen saturations on nasal cannula, venti mask, and Bi-pap and was intubated on 03/09/15 for respiratory failure. NG tube was placed. When the patient had episodes of intermittent tachycardia and decreased oxygenation, a chest radiograph was ordered revealing a right upper lobe pneumonia and was treated with Unasyn. The patient was found to have gram positive cocci in his blood and was treated with Vancomycin. 03/15/15 the patient was successfully extubated and placed on 5L nasal cannula. Pt was alert after extubation with some memory deficits. He was found to have moderate abdominal distention with fluid wave and received a paracentesis by interventional radiology on 03/18/15. Patient had scrotal swelling after the procedure, which was appropriately monitored. He had continued issues with asymptomatic tachycardia and was seen by cardiology on 03/19/15 where atrial fibrillation and atrial flutter were ruled out. The patient was started on a low dose beta blocker to manage palpitations. On 03/23/15 the patients pain was controlled, he was tolerating his diet, working with therapies, his cognitive deficits were improving and he was approved for admission to a skilled nursing facility.  The patient was not voiding on his own and required in and out catheters for bladder decompression, followed by placement of a foley catheter on 03/14/15. Foley is still in place due to penile swelling.  Nutrition: tube feeds were  initiated on 03/10/15 due  to patients NPO status. Patient pulled out his NG on 03/15/15. Speech therapy evaluated the patient and placed him on a dysphagia 1 diet on 03/16/15. The patients diet was advanced according to speech therapy and he is currently on a dysphagia 3 diet with thin liquids by cup.  Therapies: The patient was evaluated by PT/OT and speech therapy and was found to be a candidate for inpatient rehab but, due to limited bed availability, the patient will be discharged to a skilled nursing facility.   The patient can follow up with [facility] services as needed.      Medication List    TAKE these medications        amLODipine 10 MG tablet  Commonly known as:  NORVASC  Take 10 mg by mouth daily.     methadone 10 MG/5ML solution  Commonly known as:  DOLOPHINE  Take 35.5 mLs (71 mg total) by mouth daily.     naproxen 500 MG tablet  Commonly known as:  NAPROSYN  Take 1 tablet (500 mg total) by mouth 2 (two) times daily with a meal.     oxyCODONE-acetaminophen 7.5-325 MG tablet  Commonly known as:  PERCOCET  Take 1-2 tablets by mouth every 4 (four) hours as needed.     potassium chloride 20 MEQ packet  Commonly known as:  KLOR-CON  Take 20 mEq by mouth daily.         Follow-up Information    Call [facility].   Why:  As needed   Contact information:   [address] [id number] mc [place] [address] [phone]      Signed: Mikal Plane, PA-S Physician Assistant Student, [facility] [phone]   03/23/2015, 12:58 PM

## 2015-03-23 NOTE — Progress Notes (Signed)
Pt says he will need his discharge paperwork ready for 10/11 in the morning. Please make arrangements for an early discharge

## 2015-03-23 NOTE — Progress Notes (Signed)
Occupational Therapy Treatment Patient Details Name: Benjamin Bell MRN: [id number] DOB: July 28, 1955 Today's Date: 03/23/2015    History of present illness 60 yo with hx of COPD, HTN, and Chronic Pain requiring Methadone.  s/p fall with resulting multiple L sided fractures; ant pneumothorax on L. Develpoed respiratory distress. Intubated 03/09/15 - 03/15/15. Acute encephalopathy.    OT comments  Pt engaged in activity today for ADL retraining session sitting EOB. Pt should benefit from home program for bilateral UE strengthening and conditioning, consider written theraband program next 1-2 visits. Overall progressing toward goals, cont to demonstrate decreased awareness of deficits.     Follow Up Recommendations  CIR;Supervision/Assistance - 24 hour    Equipment Recommendations  3 in 1 bedside comode;Tub/shower bench    Recommendations for Other Services      Precautions / Restrictions Precautions Precautions: Fall;Other (comment) Precaution Comments: Contact Restrictions Weight Bearing Restrictions: No       Mobility Bed Mobility               General bed mobility comments:  (Pt sitting up at EOB upon OT arrival)  Transfers                      Balance                                   ADL Overall ADL's : Needs assistance/impaired Eating/Feeding: Set up;Sitting;Supervision/ safety Eating/Feeding Details (indicate cue type and reason): Set up sitting EOB  Grooming: Wash/dry hands;Wash/dry face;Brushing hair;Set up;Supervision/safety;Sitting (Pt declined oral care stating wife would assist  "When she gets here") Grooming Details (indicate cue type and reason): Sitting EOB Upper Body Bathing: Set up;Min guard;Sitting                             General ADL Comments: Engaged in activity; expressed concern today for being away from home/apartment and wife. Stated that he believes that neighbors will steal from him b/c he is not around.  Reviewed POC and OT goals with pt. Nice gains for grooming, self feeding and UB bathing. Consider theraband Home program for UE conditioning. Cont to recommend CIR vs ST rehab at [facility].      Vision                     Perception     Praxis      Cognition   Behavior During Therapy: [facility] for tasks assessed/performed Overall Cognitive Status: Within Functional Limits for tasks assessed Area of Impairment: Attention;Awareness;Problem solving   Current Attention Level: Selective      Safety/Judgement: Decreased awareness of deficits Awareness: Emergent Problem Solving: Slow processing;Decreased initiation General Comments: improving cognitively.    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 Pt will benefit  from cont UE strengthening to assist with increaing endurance and activity tolerance in preaparation for increased participation with ADL s and functional transfers. Consider theraband and written HEP    Shoulder Instructions       General Comments      Pertinent  Vitals/ Pain       Pain Assessment: 0-10 Pain Score: 6  Faces Pain Scale: Hurts even more Pain Location: L lower ribs Pain Descriptors / Indicators: Aching;Sore Pain Intervention(s): Limited activity within patient's tolerance;Monitored during session;RN gave pain meds during session  Home Living                                          Prior Functioning/Environment              Frequency Min 2X/week     Progress Toward Goals  OT Goals(current goals can now be found in the care plan section)  Progress towards OT goals: Progressing toward goals  Acute Rehab OT Goals Patient  Stated Goal: I want to go home  Plan Discharge plan remains appropriate    Co-evaluation                 End of Session Equipment Utilized During Treatment: Oxygen   Activity Tolerance Patient tolerated treatment well   Patient Left in bed;with call bell/phone within reach   Nurse Communication Mobility status;Patient requests pain meds        Time: 1610-9604 OT Time Calculation (min): 23 min  Charges: OT General Charges $OT Visit: 1 Procedure OT Treatments $Self Care/Home Management : 23-37 mins  Barnhill, Amy Beth Dixon, OTR/L 03/23/2015, 9:17 AM

## 2015-03-23 NOTE — Progress Notes (Signed)
10mg po oxy ir administered for c/o pain 

## 2015-03-24 MED ORDER — OXYCODONE-ACETAMINOPHEN 7.5-325 MG PO TABS
1.0000 | ORAL_TABLET | ORAL | Status: DC | PRN
Start: 2015-03-24 — End: 2015-04-22

## 2015-03-24 NOTE — Care Management Note (Signed)
Case Management Note  Patient Details  Name: Atilano Covelli MRN: [id number] Date of Birth: 04-20-1955  Subjective/Objective:   Pt for dc home today with spouse.  Pt would not agree to dc to SNF discharge, per CSW arrangements, but agrees to discharge home with [facility] follow up.                   Action/Plan: Referral to Darl Pikes with [facility] for [facility] and HHPT.  DME ordered from [facility] and is to be delivered to pt's room prior to dc.  Start of care 24-48h post dc date.    Expected Discharge Date:     03/24/2015             Expected Discharge Plan:  Home w Home Health Services  In-House Referral:     Discharge planning Services  CM Consult  Post Acute Care Choice:  Home Health Choice offered to:  Patient  DME Arranged:  3-N-1, Walker rolling, Tub bench DME Agency:  [facility]  HH Arranged:  RN, PT [facility] Agency:  [facility]  Status of Service:  Completed, signed off  Medicare Important Message Given:    Date Medicare IM Given:    Medicare IM give by:    Date Additional Medicare IM Given:    Additional Medicare Important Message give by:     If discussed at Long Length of Stay Meetings, dates discussed:    Additional Comments:  Quintella Baton, RN, BSN  Trauma/Neuro ICU Case Manager [phone]

## 2015-03-24 NOTE — Progress Notes (Signed)
Patient ID: Benjamin Bell, male   DOB: [birth year], 60 y.o.   MRN: [id number]   LOS: 17 days   Subjective: No new c/o. Ready to go home. After reading PT/OT notes and speaking with pt and wife I have no concerns about that.   Objective: Vital signs in last 24 hours: Temp:  [98.4 F (36.9 C)-98.6 F (37 C)] 98.4 F (36.9 C) (01/25 0601) Pulse Rate:  [92-111] 92 (01/25 0601) Resp:  [18] 18 (01/25 0601) BP: (102-118)/(71-85) 105/83 mmHg (01/25 0601) SpO2:  [95 %-99 %] 97 % (01/25 0601) Weight:  [85.5 kg (188 lb 7.9 oz)] 85.5 kg (188 lb 7.9 oz) (01/25 0601) Last BM Date: 03/21/15   Physical Exam General appearance: alert and no distress Resp: clear to auscultation bilaterally Cardio: regular rate and rhythm   Assessment/Plan: Fall Multiple left rib fractures w/PTX - pulm toilet, IS Acute encephalopathy - Resolved Urinary retention - on urecholine and Flomax, voiding trial Cirrhosis  Chronic pain - home methadone  daily Pulmonary/adrenal/hepatic masses -- OP f/u AAA <5cm -- OP f/u Tobacco Abuse HTN  FEN - dysphagia 3 diet with thin liquids now VTE- SCDs; Cards states patient is not a good candidate for anticoagulation due to liver dz Dispo - Home today after PT, voiding trial. Will need HH vs OP PT/OT, will d/w CM.    [name], PA-C Pager: [phone] General Trauma PA Pager: [phone]  03/24/2015

## 2015-03-24 NOTE — Discharge Summary (Signed)
[facility] Discharge Summary   Patient ID: Benjamin Bell MRN: [id number] DOB/AGE: 11-16-1955 60 y.o.  Admit date: 03/07/2015 Discharge date: 03/24/2015  Admitting Diagnosis: Fall Multiple rib fractures, Left Pneumonthorax, Left COPD Heavy tobacco use  Discharge Diagnosis Patient Active Problem List   Diagnosis Date Noted  . Pressure ulcer 03/22/2015  . Acute blood loss anemia 03/19/2015  . Hepatic cirrhosis (HCC) 03/15/2015  . Pulmonary nodules 03/15/2015  . Adrenal mass (HCC) 03/15/2015  . AAA (abdominal aortic aneurysm) (HCC) 03/15/2015  . Urinary retention 03/15/2015  . Pneumonia 03/12/2015  . Hypernatremia 03/12/2015  . Acute respiratory failure following trauma and surgery (HCC) 03/09/2015  . Fall   . Traumatic hemopneumothorax   . Multiple rib fractures 03/07/2015   Consultants Luci Bank, NP-C (Critical Care)  Dr. Riley Kill (Physical Medicine) Dr. Tenny Craw (Cardiology)   Imaging: 03/07/15: CT CHEST W/ CONTRAST- multiple left sided rib fractures, anterior pneumothorax on the left, subcutaneous emphysema over the left chest wall. 03/08/14: DG CHEST - progressive dense right upper lobe infiltrate, small left apical pneumothorax, subcutaneous emphysema 03/09/14: DG CHEST - endotracheal tube stable, enteric tube in good position, no pneumothorax  Procedures Dr. Delton Coombes (03/09/15) - Intubation Manson Passey, RRT (03/15/15) - Extubation Bruning, PA-C (03/18/2015) - Ultrasound guided paracentesis; 600 mL hazy, yellow fluid removed.  Hospital Course:  Presentation and admission: 60 year old while male who presented to [facility] via EMS on 03/07/15 after falling on ice while walking his dog at 3:00am. His medical history is significant for chronic back pain on methadone, severe COPD, cirrhosis, and a history of a right pneumothorax requiring chest tube placement. He was in a c-collar on arrival to ED. The patients' main complaints on arrival were left-sided chest pain and neck pain.  Initial BP upon arrival was 80/50 but a 300 ml bolus of normal saline normalized his blood pressure. Physical exam significant for decreased breath sounds, wheezes in the left lower lung field, and chest wall tenderness. CT significant for the above findings. The patient was hemodynamically stable with normal oxygen saturations and was transferred to the ICU for pulmonary monitoring and pain control.   Patients c-spine was cleared on 03/08/15. The patient had trouble maintaining oxygen saturations on nasal cannula, venti mask, and Bi-pap and was intubated on 03/09/15 for respiratory failure. NG tube was placed. When the patient had episodes of intermittent tachycardia and decreased oxygenation, a chest radiograph was ordered revealing a right upper lobe pneumonia and was treated with Unasyn. The patient was found to have gram positive cocci in his blood and was treated with Vancomycin. 03/15/15 the patient was successfully extubated and placed on 5L nasal cannula. Pt was alert after extubation with some memory deficits. He was found to have moderate abdominal distention with fluid wave and received a paracentesis by interventional radiology on 03/18/15. Patient had scrotal swelling after the procedure, which was appropriately monitored. He had continued issues with asymptomatic tachycardia and was seen by cardiology on 03/19/15 where atrial fibrillation and atrial flutter were ruled out. The patient was started on a low dose beta blocker to manage palpitations. On 03/23/15 the patients pain was controlled, he was tolerating his diet, working with therapies, his cognitive deficits were improving and he was approved for admission to a skilled nursing facility. However, it was out of town and the patient was confident he could do well at home with his wife's 24-hour assistance. We agreed and he was discharged home in good condition.  The patient was not voiding on his own  and required in and out catheters for  bladder decompression, followed by placement of a foley catheter on 03/14/15. Foley was still in place due to penile swelling but a voiding trial was attempted prior to discharge. Depending on the results of that he may have had to have a foley replaced and outpatient follow up with urology.  Nutrition: tube feeds were initiated on 03/10/15 due to patients NPO status. Patient pulled out his NG on 03/15/15. Speech therapy evaluated the patient and placed him on a dysphagia 1 diet on 03/16/15. The patients diet was advanced according to speech therapy and he is currently on a dysphagia 3 diet with thin liquids by cup.  Therapies: The patient was evaluated by PT/OT and speech therapy and was found to be a candidate for inpatient rehab but, due to limited bed availability, the patient will be discharged to a skilled nursing facility.   The patient can follow up with [facility] trauma services as needed.      Medication List    TAKE these medications        amLODipine 10 MG tablet  Commonly known as:  NORVASC  Take 10 mg by mouth daily.     methadone 10 MG/5ML solution  Commonly known as:  DOLOPHINE  Take 35.5 mLs (71 mg total) by mouth daily.     naproxen 500 MG tablet  Commonly known as:  NAPROSYN  Take 1 tablet (500 mg total) by mouth 2 (two) times daily with a meal.     oxyCODONE-acetaminophen 7.5-325 MG tablet  Commonly known as:  PERCOCET  Take 1-2 tablets by mouth every 4 (four) hours as needed.     potassium chloride 20 MEQ packet  Commonly known as:  KLOR-CON  Take 20 mEq by mouth daily.         Follow-up Information    Call [facility] TRAUMA [facility].   Why:  As needed   Contact information:   [address] [id number] mc [place] [address] [phone]      Signed: Mikal Plane, PA-S Physician Assistant Student, [facility]  [name], PA-C Pager: [phone] General Trauma PA  Pager: [phone] Surgery [phone]   03/24/2015, 11:07 AM

## 2015-03-24 NOTE — Progress Notes (Signed)
Discharge home. Home discharge instruction given. Case management arranged equipment delivery. Patient having issue with ride so they did not wait for the equipment , case manager made aware and will see if equipment be delivered at home. Patient wife went ahead and took patient for discharge even without equipment.

## 2015-03-25 ENCOUNTER — Telehealth (HOSPITAL_COMMUNITY): Payer: Self-pay | Admitting: Orthopedic Surgery

## 2015-03-25 NOTE — Telephone Encounter (Signed)
CM to call pt.

## 2015-03-25 NOTE — Discharge Planning (Signed)
03/24/14, Received call from Mrs. Walz, they have not received equipment or heard from [facility]. Trauma Case Mng informed and she will f/u with patient.

## 2015-03-26 ENCOUNTER — Emergency Department (HOSPITAL_COMMUNITY): Payer: Medicaid Other

## 2015-03-26 ENCOUNTER — Inpatient Hospital Stay (HOSPITAL_COMMUNITY)
Admission: EM | Admit: 2015-03-26 | Discharge: 2015-03-31 | DRG: 871 | Disposition: A | Discharge status: Home / Self Care | Payer: Medicaid Other | Attending: Internal Medicine | Admitting: Internal Medicine

## 2015-03-26 ENCOUNTER — Encounter (HOSPITAL_COMMUNITY): Payer: Self-pay | Admitting: Emergency Medicine

## 2015-03-26 ENCOUNTER — Inpatient Hospital Stay (HOSPITAL_COMMUNITY): Payer: Medicaid Other

## 2015-03-26 ENCOUNTER — Telehealth (HOSPITAL_COMMUNITY): Payer: Self-pay | Admitting: Orthopedic Surgery

## 2015-03-26 DIAGNOSIS — D649 Anemia, unspecified: Secondary | ICD-10-CM | POA: Diagnosis present

## 2015-03-26 DIAGNOSIS — E872 Acidosis, unspecified: Secondary | ICD-10-CM

## 2015-03-26 DIAGNOSIS — F419 Anxiety disorder, unspecified: Secondary | ICD-10-CM | POA: Diagnosis present

## 2015-03-26 DIAGNOSIS — Z4659 Encounter for fitting and adjustment of other gastrointestinal appliance and device: Secondary | ICD-10-CM | POA: Diagnosis not present

## 2015-03-26 DIAGNOSIS — J9602 Acute respiratory failure with hypercapnia: Secondary | ICD-10-CM | POA: Insufficient documentation

## 2015-03-26 DIAGNOSIS — E876 Hypokalemia: Secondary | ICD-10-CM | POA: Diagnosis present

## 2015-03-26 DIAGNOSIS — S271XXA Traumatic hemothorax, initial encounter: Secondary | ICD-10-CM | POA: Diagnosis present

## 2015-03-26 DIAGNOSIS — F17200 Nicotine dependence, unspecified, uncomplicated: Secondary | ICD-10-CM | POA: Diagnosis present

## 2015-03-26 DIAGNOSIS — J9601 Acute respiratory failure with hypoxia: Secondary | ICD-10-CM | POA: Insufficient documentation

## 2015-03-26 DIAGNOSIS — G9340 Encephalopathy, unspecified: Secondary | ICD-10-CM | POA: Diagnosis present

## 2015-03-26 DIAGNOSIS — J918 Pleural effusion in other conditions classified elsewhere: Secondary | ICD-10-CM | POA: Diagnosis present

## 2015-03-26 DIAGNOSIS — J440 Chronic obstructive pulmonary disease with acute lower respiratory infection: Secondary | ICD-10-CM | POA: Diagnosis present

## 2015-03-26 DIAGNOSIS — A419 Sepsis, unspecified organism: Secondary | ICD-10-CM | POA: Diagnosis present

## 2015-03-26 DIAGNOSIS — J942 Hemothorax: Secondary | ICD-10-CM | POA: Diagnosis not present

## 2015-03-26 DIAGNOSIS — J95821 Acute postprocedural respiratory failure: Secondary | ICD-10-CM

## 2015-03-26 DIAGNOSIS — G8929 Other chronic pain: Secondary | ICD-10-CM | POA: Diagnosis present

## 2015-03-26 DIAGNOSIS — T85598A Other mechanical complication of other gastrointestinal prosthetic devices, implants and grafts, initial encounter: Secondary | ICD-10-CM

## 2015-03-26 DIAGNOSIS — J432 Centrilobular emphysema: Secondary | ICD-10-CM | POA: Diagnosis not present

## 2015-03-26 DIAGNOSIS — N179 Acute kidney failure, unspecified: Secondary | ICD-10-CM | POA: Diagnosis present

## 2015-03-26 DIAGNOSIS — Y95 Nosocomial condition: Secondary | ICD-10-CM | POA: Diagnosis present

## 2015-03-26 DIAGNOSIS — J90 Pleural effusion, not elsewhere classified: Secondary | ICD-10-CM

## 2015-03-26 DIAGNOSIS — I10 Essential (primary) hypertension: Secondary | ICD-10-CM | POA: Diagnosis present

## 2015-03-26 DIAGNOSIS — R6521 Severe sepsis with septic shock: Secondary | ICD-10-CM | POA: Diagnosis present

## 2015-03-26 DIAGNOSIS — K7460 Unspecified cirrhosis of liver: Secondary | ICD-10-CM | POA: Diagnosis present

## 2015-03-26 DIAGNOSIS — I248 Other forms of acute ischemic heart disease: Secondary | ICD-10-CM | POA: Diagnosis present

## 2015-03-26 DIAGNOSIS — Z4682 Encounter for fitting and adjustment of non-vascular catheter: Secondary | ICD-10-CM

## 2015-03-26 DIAGNOSIS — I952 Hypotension due to drugs: Secondary | ICD-10-CM | POA: Diagnosis present

## 2015-03-26 DIAGNOSIS — M549 Dorsalgia, unspecified: Secondary | ICD-10-CM | POA: Diagnosis present

## 2015-03-26 DIAGNOSIS — I714 Abdominal aortic aneurysm, without rupture: Secondary | ICD-10-CM | POA: Diagnosis present

## 2015-03-26 DIAGNOSIS — R0602 Shortness of breath: Secondary | ICD-10-CM | POA: Diagnosis present

## 2015-03-26 DIAGNOSIS — J189 Pneumonia, unspecified organism: Secondary | ICD-10-CM | POA: Diagnosis present

## 2015-03-26 DIAGNOSIS — R52 Pain, unspecified: Secondary | ICD-10-CM | POA: Diagnosis not present

## 2015-03-26 HISTORY — DX: Chronic obstructive pulmonary disease, unspecified: J44.9

## 2015-03-26 HISTORY — DX: Abdominal aortic aneurysm, without rupture: I71.4

## 2015-03-26 HISTORY — DX: Abdominal aortic aneurysm, without rupture, unspecified: I71.40

## 2015-03-26 HISTORY — DX: Acute kidney failure, unspecified: N17.9

## 2015-03-26 LAB — URINE MICROSCOPIC-ADD ON
RBC / HPF: NONE SEEN RBC/hpf (ref 0–5)
SQUAMOUS EPITHELIAL / LPF: NONE SEEN

## 2015-03-26 LAB — BLOOD GAS, VENOUS
Acid-base deficit: 8.9 mmol/L — ABNORMAL HIGH (ref 0.0–2.0)
BICARBONATE: 18 meq/L — AB (ref 20.0–24.0)
DELIVERY SYSTEMS: POSITIVE
FIO2: 0.5
O2 Saturation: 29.8 %
PATIENT TEMPERATURE: 98.6
PEEP: 6 cmH2O
PH VEN: 7.219 — AB (ref 7.250–7.300)
Pressure support: 12 cmH2O
RATE: 20 resp/min
TCO2: 17.6 mmol/L (ref 0–100)
pCO2, Ven: 45.8 mmHg (ref 45.0–50.0)

## 2015-03-26 LAB — CBC WITH DIFFERENTIAL/PLATELET
Basophils Absolute: 0 10*3/uL (ref 0.0–0.1)
Basophils Relative: 0 %
EOS ABS: 0.1 10*3/uL (ref 0.0–0.7)
Eosinophils Relative: 0 %
HEMATOCRIT: 37.8 % — AB (ref 39.0–52.0)
HEMOGLOBIN: 12 g/dL — AB (ref 13.0–17.0)
LYMPHS ABS: 1.3 10*3/uL (ref 0.7–4.0)
Lymphocytes Relative: 9 %
MCH: 31.5 pg (ref 26.0–34.0)
MCHC: 31.7 g/dL (ref 30.0–36.0)
MCV: 99.2 fL (ref 78.0–100.0)
MONO ABS: 1 10*3/uL (ref 0.1–1.0)
MONOS PCT: 8 %
NEUTROS ABS: 11.2 10*3/uL — AB (ref 1.7–7.7)
NEUTROS PCT: 83 %
Platelets: 269 10*3/uL (ref 150–400)
RBC: 3.81 MIL/uL — ABNORMAL LOW (ref 4.22–5.81)
RDW: 18 % — AB (ref 11.5–15.5)
WBC: 13.6 10*3/uL — ABNORMAL HIGH (ref 4.0–10.5)

## 2015-03-26 LAB — URINALYSIS, ROUTINE W REFLEX MICROSCOPIC
Glucose, UA: NEGATIVE mg/dL
HGB URINE DIPSTICK: NEGATIVE
KETONES UR: NEGATIVE mg/dL
Nitrite: NEGATIVE
PH: 5 (ref 5.0–8.0)
Protein, ur: NEGATIVE mg/dL
SPECIFIC GRAVITY, URINE: 1.021 (ref 1.005–1.030)

## 2015-03-26 LAB — COMPREHENSIVE METABOLIC PANEL
ALBUMIN: 2.3 g/dL — AB (ref 3.5–5.0)
ALK PHOS: 233 U/L — AB (ref 38–126)
ALT: 63 U/L (ref 17–63)
AST: 145 U/L — ABNORMAL HIGH (ref 15–41)
Anion gap: 14 (ref 5–15)
BUN: 17 mg/dL (ref 6–20)
CALCIUM: 8.1 mg/dL — AB (ref 8.9–10.3)
CHLORIDE: 96 mmol/L — AB (ref 101–111)
CO2: 25 mmol/L (ref 22–32)
CREATININE: 1.41 mg/dL — AB (ref 0.61–1.24)
GFR calc non Af Amer: 53 mL/min — ABNORMAL LOW (ref >60)
GLUCOSE: 106 mg/dL — AB (ref 65–99)
Potassium: 4.4 mmol/L (ref 3.5–5.1)
SODIUM: 135 mmol/L (ref 135–145)
Total Bilirubin: 1.4 mg/dL — ABNORMAL HIGH (ref 0.3–1.2)
Total Protein: 6.9 g/dL (ref 6.5–8.1)

## 2015-03-26 LAB — I-STAT TROPONIN, ED
TROPONIN I, POC: 0.88 ng/mL — AB (ref 0.00–0.08)
Troponin i, poc: 0.15 ng/mL (ref 0.00–0.08)

## 2015-03-26 LAB — BRAIN NATRIURETIC PEPTIDE: B Natriuretic Peptide: 125.1 pg/mL — ABNORMAL HIGH (ref 0.0–100.0)

## 2015-03-26 LAB — I-STAT CG4 LACTIC ACID, ED
LACTIC ACID, VENOUS: 9.1 mmol/L — AB (ref 0.5–2.0)
Lactic Acid, Venous: 9.44 mmol/L (ref 0.5–2.0)

## 2015-03-26 LAB — MAGNESIUM: MAGNESIUM: 1.9 mg/dL (ref 1.7–2.4)

## 2015-03-26 LAB — PHOSPHORUS: PHOSPHORUS: 5.9 mg/dL — AB (ref 2.5–4.6)

## 2015-03-26 MED ORDER — VANCOMYCIN HCL IN DEXTROSE 1-5 GM/200ML-% IV SOLN
1000.0000 mg | Freq: Once | INTRAVENOUS | Status: AC
  Administered 2015-03-26: 1000 mg via INTRAVENOUS
  Filled 2015-03-26: qty 200

## 2015-03-26 MED ORDER — METHYLPREDNISOLONE SODIUM SUCC 125 MG IJ SOLR
125.0000 mg | Freq: Once | INTRAMUSCULAR | Status: AC
  Administered 2015-03-26: 125 mg via INTRAVENOUS
  Filled 2015-03-26: qty 2

## 2015-03-26 MED ORDER — SODIUM CHLORIDE 0.9 % IV BOLUS (SEPSIS)
1000.0000 mL | INTRAVENOUS | Status: AC
  Administered 2015-03-26 (×2): 1000 mL via INTRAVENOUS

## 2015-03-26 MED ORDER — HEPARIN SODIUM (PORCINE) 5000 UNIT/ML IJ SOLN
5000.0000 [IU] | Freq: Three times a day (TID) | INTRAMUSCULAR | Status: DC
  Administered 2015-03-27 – 2015-03-31 (×13): 5000 [IU] via SUBCUTANEOUS
  Filled 2015-03-26 (×19): qty 1

## 2015-03-26 MED ORDER — MIDAZOLAM HCL 2 MG/2ML IJ SOLN
INTRAMUSCULAR | Status: AC
  Filled 2015-03-26: qty 6

## 2015-03-26 MED ORDER — FAMOTIDINE IN NACL 20-0.9 MG/50ML-% IV SOLN
20.0000 mg | Freq: Two times a day (BID) | INTRAVENOUS | Status: DC
  Administered 2015-03-27 – 2015-03-29 (×6): 20 mg via INTRAVENOUS
  Filled 2015-03-26 (×6): qty 50

## 2015-03-26 MED ORDER — DEXTROSE 5 % IV SOLN: 2.0000 g | Freq: Once | INTRAVENOUS | Status: DC

## 2015-03-26 MED ORDER — PROPOFOL 1000 MG/100ML IV EMUL
INTRAVENOUS | Status: AC
Start: 2015-03-26 — End: 2015-03-26
  Administered 2015-03-26: 23:00:00
  Filled 2015-03-26: qty 100

## 2015-03-26 MED ORDER — ALBUTEROL (5 MG/ML) CONTINUOUS INHALATION SOLN
10.0000 mg/h | INHALATION_SOLUTION | RESPIRATORY_TRACT | Status: DC
  Administered 2015-03-26: 10 mg/h via RESPIRATORY_TRACT

## 2015-03-26 MED ORDER — MAGNESIUM SULFATE 50 % IJ SOLN: 2.0000 g | Freq: Once | INTRAMUSCULAR | Status: DC

## 2015-03-26 MED ORDER — PIPERACILLIN-TAZOBACTAM 3.375 G IVPB
3.3750 g | Freq: Once | INTRAVENOUS | Status: AC
  Administered 2015-03-26: 3.375 g via INTRAVENOUS
  Filled 2015-03-26: qty 50

## 2015-03-26 MED ORDER — DEXTROSE 5 % IV SOLN
1.0000 g | Freq: Three times a day (TID) | INTRAVENOUS | Status: DC
  Administered 2015-03-26 – 2015-03-28 (×5): 1 g via INTRAVENOUS
  Filled 2015-03-26 (×6): qty 1

## 2015-03-26 MED ORDER — VANCOMYCIN HCL IN DEXTROSE 750-5 MG/150ML-% IV SOLN
750.0000 mg | Freq: Three times a day (TID) | INTRAVENOUS | Status: DC
  Administered 2015-03-27: 750 mg via INTRAVENOUS
  Filled 2015-03-26 (×2): qty 150

## 2015-03-26 MED ORDER — FENTANYL CITRATE (PF) 100 MCG/2ML IJ SOLN
INTRAMUSCULAR | Status: AC
  Filled 2015-03-26: qty 2

## 2015-03-26 MED ORDER — MIDAZOLAM HCL 2 MG/2ML IJ SOLN
INTRAMUSCULAR | Status: AC
  Filled 2015-03-26: qty 2

## 2015-03-26 MED ORDER — NOREPINEPHRINE BITARTRATE 1 MG/ML IV SOLN
0.0000 ug/min | INTRAVENOUS | Status: DC
  Administered 2015-03-26: 2 ug/min via INTRAVENOUS
  Administered 2015-03-27: 30 ug/min via INTRAVENOUS
  Filled 2015-03-26 (×4): qty 4

## 2015-03-26 MED ORDER — LIDOCAINE HCL 2 % IJ SOLN
INTRAMUSCULAR | Status: AC
  Filled 2015-03-26: qty 20

## 2015-03-26 MED ORDER — MAGNESIUM SULFATE 2 GM/50ML IV SOLN
2.0000 g | Freq: Once | INTRAVENOUS | Status: AC
  Administered 2015-03-26: 2 g via INTRAVENOUS
  Filled 2015-03-26: qty 50

## 2015-03-26 MED ORDER — SODIUM CHLORIDE 0.9 % IV BOLUS (SEPSIS)
500.0000 mL | INTRAVENOUS | Status: AC
  Administered 2015-03-26: 500 mL via INTRAVENOUS

## 2015-03-26 MED ORDER — ALBUTEROL (5 MG/ML) CONTINUOUS INHALATION SOLN
INHALATION_SOLUTION | RESPIRATORY_TRACT | Status: AC
  Filled 2015-03-26: qty 20

## 2015-03-26 MED ORDER — SODIUM CHLORIDE 0.9 % IV SOLN
250.0000 mL | INTRAVENOUS | Status: DC | PRN
  Administered 2015-03-27: 20 mL via INTRAVENOUS

## 2015-03-26 NOTE — H&P (Signed)
PULMONARY / CRITICAL CARE MEDICINE   Name: Benjamin Bell MRN: [id number] DOB: June 14, 1955    ADMISSION DATE:  03/26/2015  CHIEF COMPLAINT:  SOB  HISTORY OF PRESENT ILLNESS:   60yo M with MMP including AAA, pulmonary nodules, hepatic cirrhosis recently d/ced from [facility] on 03/24/2015 after a mechanical fall, multiple left rib fractures with left traumatic hydropneumothorax intubated development of right UL pneumonia and Left sided pleural effusion.  He was discharged and states that since that time he has been severely SOB with cough and subjective fevers and chills.   EMS was called by his wife and he was found unresponsive.  He awoke with a dose of narcan but upon arrival to the ED he became progressively hypoxemic requiring Bipap eventually.   PAST MEDICAL HISTORY :   has a past medical history of Chronic back pain and Hypertension.  has no past surgical history on file. Prior to Admission medications   Medication Sig Start Date End Date Taking? Authorizing Provider  amLODipine (NORVASC) 10 MG tablet Take 10 mg by mouth daily.    Historical Provider, MD  methadone (DOLOPHINE) 10 MG/5ML solution Take 35.5 mLs (71 mg total) by mouth daily. 03/23/15   [name], PA-C  naproxen (NAPROSYN) 500 MG tablet Take 1 tablet (500 mg total) by mouth 2 (two) times daily with a meal. 03/23/15   [name], PA-C  oxyCODONE-acetaminophen (PERCOCET) 7.5-325 MG tablet Take 1-2 tablets by mouth every 4 (four) hours as needed. 03/24/15   [name], PA-C  potassium chloride (KLOR-CON) 20 MEQ packet Take 20 mEq by mouth daily.    Historical Provider, MD   No Known Allergies  FAMILY HISTORY:  has no family status information on file.  SOCIAL HISTORY:  reports that he has been smoking.  He does not have any smokeless tobacco history on file. He reports that he does not drink alcohol.  REVIEW OF SYSTEMS:  Unable to obtain due to noise of Bipap and rapid desaturations with removal of  bipap.  SUBJECTIVE:   VITAL SIGNS: Temp:  [97.5 F (36.4 C)-98.1 F (36.7 C)] 98.1 F (36.7 C) (01/27 1855) Pulse Rate:  [101-129] 114 (01/27 2235) Resp:  [9-32] 12 (01/27 2235) BP: (82-162)/(47-108) 130/65 mmHg (01/27 2235) SpO2:  [83 %-96 %] 89 % (01/27 2235) FiO2 (%):  [50 %] 50 % (01/27 1804) Weight:  [68.04 kg (150 lb)] 68.04 kg (150 lb) (01/27 1732) HEMODYNAMICS:   VENTILATOR SETTINGS: Vent Mode:  [-]  FiO2 (%):  [50 %] 50 % INTAKE / OUTPUT: No intake or output data in the 24 hours ending 03/26/15 2247  PHYSICAL EXAMINATION: General:  + respiratory distress. Neuro:  CN II-XII intact, oriented x3 HEENT:  Edentulous.  NCAT Cardiovascular:  Tachycardia, no m/r/g Lungs:  Diffuse rales b/l.  Decreased left sided breath sounds over 3/4 left lung. Abdomen:  Soft, distended with bowel gas, TTP, + tympany to percussion  Musculoskeletal:  Normal bulk and tone,  Skin:  No c/c/e  LABS:  CBC  Recent Labs Lab 03/26/15 1738  WBC 13.6*  HGB 12.0*  HCT 37.8*  PLT 269   Coag's No results for input(s): APTT, INR in the last 168 hours. BMET  Recent Labs Lab 03/21/15 0418 03/26/15 1738  NA 141 135  K 3.5 4.4  CL 101 96*  CO2 36* 25  BUN 18 17  CREATININE 0.92 1.41*  GLUCOSE 108* 106*   Electrolytes  Recent Labs Lab 03/21/15 0418 03/26/15 1738  CALCIUM 7.6*  8.1*  MG  --  1.9  PHOS  --  5.9*   Sepsis Markers  Recent Labs Lab 03/26/15 1800 03/26/15 2152  LATICACIDVEN 9.44* 9.10*   ABG No results for input(s): PHART, PCO2ART, PO2ART in the last 168 hours. Liver Enzymes  Recent Labs Lab 03/26/15 1738  AST 145*  ALT 63  ALKPHOS 233*  BILITOT 1.4*  ALBUMIN 2.3*   Cardiac Enzymes No results for input(s): TROPONINI, PROBNP in the last 168 hours. Glucose  Recent Labs Lab 03/20/15 2019 03/20/15 2255  GLUCAP 142* 160*    Imaging Dg Chest Portable 1 View  03/26/2015  CLINICAL DATA:  Acute respiratory failure following trauma and surgery.  Multiple rib fractures. Traumatic hemopneumothorax. Central line placement. EXAM: PORTABLE CHEST 1 VIEW COMPARISON:  03/26/2015 FINDINGS: New right jugular central venous catheter is seen with tip overlying the lower right atrium approximately 9 cm below the superior cavoatrial junction. No pneumothorax visualized. Enlarging layering left pleural effusion and left lung atelectasis seen. Diffuse heterogeneous bilateral airspace disease is again demonstrated, likely due to pulmonary edema or ARDS. Cardiomegaly appears stable. IMPRESSION: Right jugular central venous catheter tip overlies the inferior right atrium, approximately 9 cm below expected level of the superior cavoatrial junction. No pneumothorax visualized. Increased size of layering left pleural effusion and left lung atelectasis. No significant change in diffuse heterogeneous bilateral airspace disease. Differential diagnosis includes pulmonary edema and ARDS. Electronically Signed   By: Myles Rosenthal M.D.   On: 03/26/2015 21:05   Dg Chest Portable 1 View  03/26/2015  CLINICAL DATA:  Shortness of breath today.  Subsequent encounter. EXAM: PORTABLE CHEST 1 VIEW COMPARISON:  Single view of the chest 03/16/2015 and 03/15/2015. FINDINGS: Large left pleural effusion persists. There is new extensive airspace disease throughout the aerated right and left lung. Heart size is enlarged. No pneumothorax. IMPRESSION: New extensive bilateral airspace disease could be due to edema and/or pneumonia. Persistent large left pleural effusion. Electronically Signed   By: Drusilla Kanner M.D.   On: 03/26/2015 18:16   Bedside diagnostic thoracentesis: frank blood.  ASSESSMENT / PLAN:  60 yo male with acute hypoxemic respiratory failure due to ARDS, likely due to HCAP compounded by large left sided pleural effusion on CXR found to have septic shock, ARDS and left sided hemothorax and NSTEMI from likely demand ischemia.  PULMONARY A: Acute hypoxemic respiratory failure  2/2 ARDS from HCAP compounded by large Left hemothorax.  Multiple old, displaced rib fractures left rib cage from trauma 2 weeks ago. P:   - Pt intubated for progressive hypoxemia and failure of NIPPV - Low TV ventilation per ARDS protocol and 6cc/kg with permissive hypercapnea - Sputum cx, urine strep and legionella antigen, RVP - Vancomycin and zosyn - Thoracentsis with frank blood - Left sided surgical chest tube placed successfully with 2L bloody output - CXR with good placement of tube and near complete evacuation of the space - CT surg notified, no surgical intervention at this time. - Non contrasted CT chest now, if Scr stabilizes will plan for contrasted study to evaluate Aorta per CT surg recommendation. - IS when off ventilator for rib fractures, no indication for fixation at present time. Pain control  CARDIOVASCULAR A: Septic shock with demand cardiac ischemia and resulting Type II NSTEMI P:  - No anticoagulation at this time - Volume resucitation with LR 30cc/kg - Norepinephrine titrated to MAP >65 - lactate now down trending - trending troponins - TTE in AM - CVC placed in ED and  repositioned by CCM  RENAL A:  AKI likely 2/2 septic shock. Peak Scr 1.4, baseline 0.92 P:   - Volume resucitation as above - Scr now down trending - foley cath inserted  GASTROINTESTINAL A:  Mild abdominal distention likely 2/2 bipap.  Mild transamonitis likely 2/2 hypotension/shock. P:   - trend LFTs - volume resucitation  HEMATOLOGIC A:  Leukocytosis with chronic anemia (likely 2/2 chronic disease) P:  - trend CBC - broad abx. - no indication for transfusion now - transfusion threashold <7  INFECTIOUS A:  Septic shock, likely pulmonary source either PNA vs empyema P:   BCx2 1/27 UC NA Sputum 1/27 - will need pleural fluid culture, unable to obtain sample, original thoracentesis sample was accidentally discarded  Abx: Vancomycin and zosyn 03/26/2015  ENDOCRINE A:    No  active issues P:   - SSI - Goal BG <180  NEUROLOGIC A:  Sedated on propofol with PRN fentanyl P:   RASS goal: 0    FAMILY  - Updates: wife updated by me via phone after all procedures were complete.   Total critical care time: 120 min  Critical care time was exclusive of separately billable procedures and treating other patients.  Critical care was necessary to treat or prevent imminent or life-threatening deterioration.  Critical care was time spent personally by me on the following activities: development of treatment plan with patient and/or surrogate as well as nursing, discussions with consultants, evaluation of patient's response to treatment, examination of patient, obtaining history from patient or surrogate, ordering and performing treatments and interventions, ordering and review of laboratory studies, ordering and review of radiographic studies, pulse oximetry and re-evaluation of patient's condition.   Galvin Proffer, DO., MS [facility]    Pulmonary and Critical Care Medicine [facility] Pager: [phone]  03/26/2015, 10:47 PM

## 2015-03-26 NOTE — ED Notes (Signed)
Bed: ZO10 Expected date:  Expected time:  Means of arrival:  Comments: Ems-asthma, sob

## 2015-03-26 NOTE — ED Notes (Signed)
First bolus administered at 1759 complete.

## 2015-03-26 NOTE — Telephone Encounter (Signed)
Pt's wife was told to bring pt to ED. He was not SOB when he was discharged.

## 2015-03-26 NOTE — ED Notes (Signed)
Pt attempted to give urina sample, was unsuccessful. Urinal at pt bedside.

## 2015-03-26 NOTE — ED Notes (Signed)
X-ray at bedside

## 2015-03-26 NOTE — ED Notes (Signed)
Pt states able to breath easier at present time; continues to deny pain.

## 2015-03-26 NOTE — ED Notes (Signed)
Schlossman, EDP made aware of patient i-stat troponin and lactic result.

## 2015-03-26 NOTE — Care Management Note (Addendum)
03/26/15  1145 Informed by [facility] Charge Nurse that pt's wife had called, stating pt needs home oxygen, and is having trouble "making it to the bathroom" without home O2.  Pt's oxygen saturations were evaluated by bedside nurse prior to discharge yesterday; up to 90 on RA with ambulation, per his nurse.  PA declined ordering home oxygen due to history of COPD.   Called pt's home and spoke with spouse.  She is irate, states, "You need to send some oxygen out here, he can't breathe, and you all sent him home without it!"  Explained that pt was evaluated for home O2, but did not qualify, and MD did not order home O2.  Recommended that pt visit urgent care or ED if having breathing issues.  She then stated, "you just want to run up more bills for the hospital!" and hung up the phone.   Case manager arranged HHRN through [facility] to visit pt prior to dc home on 03/25/15.   Called [facility], and they plan to visit pt today; requested that [facility] visit ASAP, and [facility] rep states they are calling pt now to set up a time.    Quintella Baton, RN, BSN  Trauma/Neuro ICU Case Manager [phone]

## 2015-03-26 NOTE — ED Notes (Addendum)
MD at bedside. Central line supplies at bedside. Pharmacy messaged about levaphed medication needed as soon as possible.

## 2015-03-26 NOTE — ED Notes (Addendum)
Per Dr. Dalene Seltzer ok to use central line - placed correctly. Levaphed moved to central line; currently infusion at same rate as it was peripherally.

## 2015-03-26 NOTE — ED Notes (Signed)
Per EMS pt nonresponse on arrival; given 1 mg intranasal of narcan with response; pt then complaint of SOB without pain; given 10 mg of albuterol and 0.5 mg of atrovent.

## 2015-03-26 NOTE — ED Notes (Signed)
Respiratory notified of patient's lower oxygen saturation Respiratory to bedside.

## 2015-03-26 NOTE — ED Notes (Signed)
MD at bedside. 

## 2015-03-26 NOTE — Progress Notes (Signed)
ANTIBIOTIC CONSULT NOTE - INITIAL  Pharmacy Consult for Vancomycin, Cefepime Indication: pneumonia  No Known Allergies  Patient Measurements: Height:  (180.3 cm) Weight: 150 lb (68.04 kg) IBW/kg (Calculated) : 75.3   Vital Signs: Temp: 97.5 F (36.4 C) (01/27 1732) Temp Source: Axillary (01/27 1732) BP: 162/108 mmHg (01/27 1732) Pulse Rate: 129 (01/27 1732) Intake/Output from previous day:   Intake/Output from this shift:    Labs: No results for input(s): WBC, HGB, PLT, LABCREA, CREATININE in the last 72 hours. Estimated Creatinine Clearance: 84.2 mL/min (by C-G formula based on Cr of 0.92). No results for input(s): VANCOTROUGH, VANCOPEAK, VANCORANDOM, GENTTROUGH, GENTPEAK, GENTRANDOM, TOBRATROUGH, TOBRAPEAK, TOBRARND, AMIKACINPEAK, AMIKACINTROU, AMIKACIN in the last 72 hours.   Microbiology: Recent Results (from the past 720 hour(s))  MRSA PCR Screening     Status: Abnormal   Collection Time: 03/07/15  8:19 AM  Result Value Ref Range Status   MRSA by PCR POSITIVE (A) NEGATIVE Final    Comment:        The GeneXpert MRSA Assay (FDA approved for NASAL specimens only), is one component of a comprehensive MRSA colonization surveillance program. It is not intended to diagnose MRSA infection nor to guide or monitor treatment for MRSA infections. CRITICAL RESULT CALLED TO, READ BACK BY AND VERIFIED WITH: SPOKE TO RN K.HYATT @ 11:16 BY K.PEELE   Culture, blood (routine x 2)     Status: None   Collection Time: 03/09/15  1:40 PM  Result Value Ref Range Status   Specimen Description BLOOD RIGHT ARM  Final   Special Requests BOTTLES DRAWN AEROBIC AND ANAEROBIC 10CC  Final   Culture  Setup Time   Final    GRAM POSITIVE COCCI IN CLUSTERS IN BOTH AEROBIC AND ANAEROBIC BOTTLES CRITICAL RESULT CALLED TO, READ BACK BY AND VERIFIED WITH: J PINEDA 03/10/15 @ 0959 M VESTAL    Culture   Final    STAPHYLOCOCCUS SPECIES (COAGULASE NEGATIVE) THE SIGNIFICANCE OF ISOLATING THIS  ORGANISM FROM A SINGLE SET OF BLOOD CULTURES WHEN MULTIPLE SETS ARE DRAWN IS UNCERTAIN. PLEASE NOTIFY THE MICROBIOLOGY DEPARTMENT WITHIN ONE WEEK IF SPECIATION AND SENSITIVITIES ARE REQUIRED.    Report Status 03/12/2015 FINAL  Final  Culture, blood (routine x 2)     Status: None   Collection Time: 03/09/15  1:48 PM  Result Value Ref Range Status   Specimen Description BLOOD RIGHT HAND  Final   Special Requests   Final    BOTTLES DRAWN AEROBIC AND ANAEROBIC 4CC AER 5CC ANA   Culture NO GROWTH 5 DAYS  Final   Report Status 03/14/2015 FINAL  Final  Culture, respiratory (NON-Expectorated)     Status: None   Collection Time: 03/09/15  3:01 PM  Result Value Ref Range Status   Specimen Description TRACHEAL ASPIRATE  Final   Special Requests Normal  Final   Gram Stain   Final    MODERATE WBC PRESENT,BOTH PMN AND MONONUCLEAR RARE SQUAMOUS EPITHELIAL CELLS PRESENT FEW GRAM POSITIVE COCCI IN PAIRS IN CLUSTERS FEW GRAM POSITIVE RODS Performed at [facility]    Culture   Final    ABUNDANT STREPTOCOCCUS PNEUMONIAE Performed at [facility]    Report Status 03/12/2015 FINAL  Final   Organism ID, Bacteria STREPTOCOCCUS PNEUMONIAE  Final      Susceptibility   Streptococcus pneumoniae - MIC (ETEST)*    CEFTRIAXONE 0.016 SENSITIVE Sensitive     LEVOFLOXACIN 1.0 SENSITIVE Sensitive     PENICILLIN 0.032 SENSITIVE Sensitive     *  ABUNDANT STREPTOCOCCUS PNEUMONIAE    Medical History: Past Medical History  Diagnosis Date  . Chronic back pain   . Hypertension      Assessment: 10 yoM with history of asthma arrived via EMS with shortness of breath. Per EMS, pt was non-responsive on their arrival.  Received narcan with response.  Patient was recently discharged 03/24/15 from [facility] after being treated following a fall.  Pharmacy consulted to start vancomycin and cefepime for r/o pneumonia.  ED provider ordered Vanc and Zosyn for 1x doses each.  Recent SCr 0.82 (1/22) for CrCl~83  ml/min.  Labs for today pending.  Goal of Therapy:  Vancomycin trough level 15-20 mcg/ml  Doses adjusted per renal function Eradication of infection  Plan:  1.  Vancomycin 750 mg IV q8h. 2.  Cefepime 1g IV q8h. 3.  F/u SCr, trough levels, clinical course.  Clance Boll 03/26/2015,5:47 PM

## 2015-03-26 NOTE — ED Notes (Signed)
MD at bedside completing central line

## 2015-03-26 NOTE — ED Notes (Signed)
Respiratory therapy at bedside placing BIPAP.

## 2015-03-26 NOTE — Progress Notes (Signed)
Called to patient room by RN for oxygen desaturations on BiPAP. Pressures titrated without positive response. He is awake, A/O and complains of not calling his wife. Answers questions appropriately and follows commands. Changed to CPAP 14cmH2O with positive response. SpO2 now 94-97%. EDP made aware.

## 2015-03-26 NOTE — ED Notes (Signed)
2nd bolus complete at present time.

## 2015-03-26 NOTE — ED Notes (Signed)
Hospitalist at bedside 

## 2015-03-27 ENCOUNTER — Inpatient Hospital Stay (HOSPITAL_COMMUNITY): Payer: Medicaid Other

## 2015-03-27 DIAGNOSIS — J9601 Acute respiratory failure with hypoxia: Secondary | ICD-10-CM | POA: Insufficient documentation

## 2015-03-27 DIAGNOSIS — J189 Pneumonia, unspecified organism: Secondary | ICD-10-CM

## 2015-03-27 DIAGNOSIS — Z4659 Encounter for fitting and adjustment of other gastrointestinal appliance and device: Secondary | ICD-10-CM

## 2015-03-27 DIAGNOSIS — J9602 Acute respiratory failure with hypercapnia: Secondary | ICD-10-CM

## 2015-03-27 DIAGNOSIS — J942 Hemothorax: Secondary | ICD-10-CM | POA: Insufficient documentation

## 2015-03-27 LAB — TYPE AND SCREEN
ABO/RH(D): O POS
Antibody Screen: NEGATIVE

## 2015-03-27 LAB — CBC
HCT: 31.9 % — ABNORMAL LOW (ref 39.0–52.0)
HCT: 31.5 % — ABNORMAL LOW (ref 39.0–52.0)
HEMATOCRIT: 32.4 % — AB (ref 39.0–52.0)
HEMOGLOBIN: 10.2 g/dL — AB (ref 13.0–17.0)
HEMOGLOBIN: 10.3 g/dL — AB (ref 13.0–17.0)
Hemoglobin: 10.1 g/dL — ABNORMAL LOW (ref 13.0–17.0)
MCH: 31.7 pg (ref 26.0–34.0)
MCH: 31.5 pg (ref 26.0–34.0)
MCH: 31.6 pg (ref 26.0–34.0)
MCHC: 32 g/dL (ref 30.0–36.0)
MCHC: 31.8 g/dL (ref 30.0–36.0)
MCHC: 32.1 g/dL (ref 30.0–36.0)
MCV: 99.1 fL (ref 78.0–100.0)
MCV: 99.1 fL (ref 78.0–100.0)
MCV: 98.4 fL (ref 78.0–100.0)
PLATELETS: 207 10*3/uL (ref 150–400)
Platelets: 260 10*3/uL (ref 150–400)
Platelets: 295 10*3/uL (ref 150–400)
RBC: 3.22 MIL/uL — AB (ref 4.22–5.81)
RBC: 3.27 MIL/uL — AB (ref 4.22–5.81)
RBC: 3.2 MIL/uL — ABNORMAL LOW (ref 4.22–5.81)
RDW: 17.8 % — ABNORMAL HIGH (ref 11.5–15.5)
RDW: 17.6 % — ABNORMAL HIGH (ref 11.5–15.5)
RDW: 17.7 % — ABNORMAL HIGH (ref 11.5–15.5)
WBC: 13.2 10*3/uL — AB (ref 4.0–10.5)
WBC: 15.6 10*3/uL — ABNORMAL HIGH (ref 4.0–10.5)
WBC: 8 10*3/uL (ref 4.0–10.5)

## 2015-03-27 LAB — TRIGLYCERIDES: TRIGLYCERIDES: 93 mg/dL (ref <150)

## 2015-03-27 LAB — BLOOD GAS, ARTERIAL
ACID-BASE DEFICIT: 3.1 mmol/L — AB (ref 0.0–2.0)
ACID-BASE DEFICIT: 4.3 mmol/L — AB (ref 0.0–2.0)
ACID-BASE EXCESS: 0.6 mmol/L (ref 0.0–2.0)
BICARBONATE: 21.8 meq/L (ref 20.0–24.0)
BICARBONATE: 22.6 meq/L (ref 20.0–24.0)
Bicarbonate: 25.5 mEq/L — ABNORMAL HIGH (ref 20.0–24.0)
DRAWN BY: 235321
Drawn by: 235321
Drawn by: 441261
FIO2: 0.7
FIO2: 0.8
FIO2: 1
LHR: 24 {breaths}/min
LHR: 24 {breaths}/min
MECHVT: 450 mL
O2 SAT: 90.2 %
O2 SAT: 98.4 %
O2 Saturation: 98.5 %
PCO2 ART: 48.7 mmHg — AB (ref 35.0–45.0)
PEEP/CPAP: 10 cmH2O
PEEP/CPAP: 14 cmH2O
PEEP: 12 cmH2O
PH ART: 7.277 — AB (ref 7.350–7.450)
PH ART: 7.319 — AB (ref 7.350–7.450)
PO2 ART: 126 mmHg — AB (ref 80.0–100.0)
Patient temperature: 37.5
Patient temperature: 97.1
Patient temperature: 99.4
RATE: 20 resp/min
TCO2: 20.7 mmol/L (ref 0–100)
TCO2: 21.3 mmol/L (ref 0–100)
TCO2: 23.2 mmol/L (ref 0–100)
VT: 450 mL
VT: 450 mL
pCO2 arterial: 44.7 mmHg (ref 35.0–45.0)
pCO2 arterial: 45.3 mmHg — ABNORMAL HIGH (ref 35.0–45.0)
pH, Arterial: 7.372 (ref 7.350–7.450)
pO2, Arterial: 128 mmHg — ABNORMAL HIGH (ref 80.0–100.0)
pO2, Arterial: 71.9 mmHg — ABNORMAL LOW (ref 80.0–100.0)

## 2015-03-27 LAB — CREATININE, SERUM
CREATININE: 1.32 mg/dL — AB (ref 0.61–1.24)
GFR, EST NON AFRICAN AMERICAN: 58 mL/min — AB (ref >60)

## 2015-03-27 LAB — BASIC METABOLIC PANEL
Anion gap: 10 (ref 5–15)
BUN: 21 mg/dL — AB (ref 6–20)
CHLORIDE: 103 mmol/L (ref 101–111)
CO2: 23 mmol/L (ref 22–32)
CREATININE: 1.25 mg/dL — AB (ref 0.61–1.24)
Calcium: 7.3 mg/dL — ABNORMAL LOW (ref 8.9–10.3)
GFR calc Af Amer: >60 mL/min (ref >60)
GFR calc non Af Amer: >60 mL/min (ref >60)
Glucose, Bld: 190 mg/dL — ABNORMAL HIGH (ref 65–99)
POTASSIUM: 4 mmol/L (ref 3.5–5.1)
Sodium: 136 mmol/L (ref 135–145)

## 2015-03-27 LAB — I-STAT TROPONIN, ED: Troponin i, poc: 1.12 ng/mL (ref 0.00–0.08)

## 2015-03-27 LAB — LACTIC ACID, PLASMA
LACTIC ACID, VENOUS: 4.1 mmol/L — AB (ref 0.5–2.0)
LACTIC ACID, VENOUS: 2.6 mmol/L — AB (ref 0.5–2.0)
Lactic Acid, Venous: 4 mmol/L (ref 0.5–2.0)
Lactic Acid, Venous: 1.6 mmol/L (ref 0.5–2.0)

## 2015-03-27 LAB — TROPONIN I
TROPONIN I: 2.36 ng/mL — AB (ref <0.031)
Troponin I: 2.63 ng/mL (ref <0.031)
Troponin I: 2.52 ng/mL (ref <0.031)

## 2015-03-27 LAB — PHOSPHORUS: PHOSPHORUS: 3.7 mg/dL (ref 2.5–4.6)

## 2015-03-27 LAB — PROTIME-INR
INR: 1.87 — AB (ref 0.00–1.49)
PROTHROMBIN TIME: 20.8 s — AB (ref 11.6–15.2)

## 2015-03-27 LAB — HEPATIC FUNCTION PANEL
ALBUMIN: 1.7 g/dL — AB (ref 3.5–5.0)
ALK PHOS: 169 U/L — AB (ref 38–126)
ALT: 60 U/L (ref 17–63)
AST: 123 U/L — ABNORMAL HIGH (ref 15–41)
BILIRUBIN TOTAL: 0.8 mg/dL (ref 0.3–1.2)
Bilirubin, Direct: 0.3 mg/dL (ref 0.1–0.5)
Indirect Bilirubin: 0.5 mg/dL (ref 0.3–0.9)
Total Protein: 5.3 g/dL — ABNORMAL LOW (ref 6.5–8.1)

## 2015-03-27 LAB — MAGNESIUM: MAGNESIUM: 1.9 mg/dL (ref 1.7–2.4)

## 2015-03-27 LAB — GLUCOSE, CAPILLARY
GLUCOSE-CAPILLARY: 89 mg/dL (ref 65–99)
GLUCOSE-CAPILLARY: 100 mg/dL — AB (ref 65–99)
GLUCOSE-CAPILLARY: 80 mg/dL (ref 65–99)

## 2015-03-27 LAB — I-STAT CG4 LACTIC ACID, ED: LACTIC ACID, VENOUS: 4.21 mmol/L — AB (ref 0.5–2.0)

## 2015-03-27 LAB — ABO/RH: ABO/RH(D): O POS

## 2015-03-27 LAB — STREP PNEUMONIAE URINARY ANTIGEN: Strep Pneumo Urinary Antigen: NEGATIVE

## 2015-03-27 LAB — APTT: APTT: 30 s (ref 24–37)

## 2015-03-27 LAB — LIPASE, BLOOD: Lipase: 36 U/L (ref 11–51)

## 2015-03-27 LAB — CORTISOL: CORTISOL PLASMA: 74 ug/dL

## 2015-03-27 MED ORDER — PROPOFOL 1000 MG/100ML IV EMUL
INTRAVENOUS | Status: AC
Start: 2015-03-27 — End: 2015-03-27
  Administered 2015-03-27: 25 ug/kg/min via INTRAVENOUS
  Filled 2015-03-27: qty 100

## 2015-03-27 MED ORDER — SODIUM CHLORIDE 0.9 % IV SOLN
INTRAVENOUS | Status: AC | PRN
  Administered 2015-03-26 (×2): 1000 mL via INTRAVENOUS

## 2015-03-27 MED ORDER — VANCOMYCIN HCL IN DEXTROSE 750-5 MG/150ML-% IV SOLN
750.0000 mg | Freq: Two times a day (BID) | INTRAVENOUS | Status: DC
  Administered 2015-03-27 – 2015-03-28 (×2): 750 mg via INTRAVENOUS
  Filled 2015-03-27 (×3): qty 150

## 2015-03-27 MED ORDER — DEXTROSE 5 % IV SOLN
500.0000 mg | Freq: Once | INTRAVENOUS | Status: AC
  Administered 2015-03-27: 500 mg via INTRAVENOUS
  Filled 2015-03-27: qty 500

## 2015-03-27 MED ORDER — MIDAZOLAM HCL 2 MG/2ML IJ SOLN: 4.0000 mg | Freq: Once | INTRAMUSCULAR | Status: DC

## 2015-03-27 MED ORDER — MIDAZOLAM HCL 2 MG/2ML IJ SOLN: 2.0000 mg | Freq: Once | INTRAMUSCULAR | Status: DC

## 2015-03-27 MED ORDER — VASOPRESSIN 20 UNIT/ML IV SOLN
0.0300 [IU]/min | INTRAVENOUS | Status: DC
  Filled 2015-03-27: qty 2

## 2015-03-27 MED ORDER — HYDROCORTISONE NA SUCCINATE PF 100 MG IJ SOLR
50.0000 mg | Freq: Four times a day (QID) | INTRAMUSCULAR | Status: DC
Start: 2015-03-27 — End: 2015-03-28
  Administered 2015-03-27 – 2015-03-28 (×5): 50 mg via INTRAVENOUS
  Filled 2015-03-27 (×5): qty 2

## 2015-03-27 MED ORDER — PROPOFOL 1000 MG/100ML IV EMUL
INTRAVENOUS | Status: AC | PRN
  Administered 2015-03-26: 50 ug/min via INTRAVENOUS
  Administered 2015-03-26: 20 ug/kg/min via INTRAVENOUS

## 2015-03-27 MED ORDER — MIDAZOLAM HCL 2 MG/2ML IJ SOLN
INTRAMUSCULAR | Status: AC | PRN
  Administered 2015-03-26: 4 mg via INTRAVENOUS
  Administered 2015-03-26 (×2): 2 mg via INTRAVENOUS

## 2015-03-27 MED ORDER — CHLORHEXIDINE GLUCONATE 0.12% ORAL RINSE (MEDLINE KIT)
15.0000 mL | Freq: Two times a day (BID) | OROMUCOSAL | Status: DC
  Administered 2015-03-27 – 2015-03-28 (×4): 15 mL via OROMUCOSAL

## 2015-03-27 MED ORDER — SUCCINYLCHOLINE CHLORIDE 20 MG/ML IJ SOLN
INTRAMUSCULAR | Status: AC | PRN
  Administered 2015-03-26: 30 mg via INTRAVENOUS

## 2015-03-27 MED ORDER — ETOMIDATE 2 MG/ML IV SOLN
INTRAVENOUS | Status: AC | PRN
  Administered 2015-03-26: 10 mg via INTRAVENOUS
  Administered 2015-03-26: 30 mg via INTRAVENOUS

## 2015-03-27 MED ORDER — SODIUM CHLORIDE 0.9 % IV SOLN
25.0000 ug/h | INTRAVENOUS | Status: DC
  Administered 2015-03-27: 200 ug/h via INTRAVENOUS
  Administered 2015-03-27: 50 ug/h via INTRAVENOUS
  Filled 2015-03-27 (×3): qty 50

## 2015-03-27 MED ORDER — SODIUM CHLORIDE 0.9 % IV SOLN
Freq: Once | INTRAVENOUS | Status: AC
  Administered 2015-03-27: 09:00:00 via INTRAVENOUS

## 2015-03-27 MED ORDER — NOREPINEPHRINE BITARTRATE 1 MG/ML IV SOLN
0.0000 ug/min | INTRAVENOUS | Status: DC
  Filled 2015-03-27: qty 16

## 2015-03-27 MED ORDER — SODIUM CHLORIDE 0.9 % IV BOLUS (SEPSIS)
1000.0000 mL | Freq: Once | INTRAVENOUS | Status: AC
  Administered 2015-03-27: 1000 mL via INTRAVENOUS

## 2015-03-27 MED ORDER — MIDAZOLAM HCL 2 MG/2ML IJ SOLN
2.0000 mg | Freq: Once | INTRAMUSCULAR | Status: AC
  Administered 2015-03-27: 2 mg via INTRAVENOUS
  Filled 2015-03-27: qty 2

## 2015-03-27 MED ORDER — FENTANYL BOLUS VIA INFUSION
50.0000 ug | INTRAVENOUS | Status: DC | PRN
  Administered 2015-03-27 (×2): 50 ug via INTRAVENOUS
  Filled 2015-03-27: qty 50

## 2015-03-27 MED ORDER — VITAMIN K1 10 MG/ML IJ SOLN
10.0000 mg | Freq: Once | INTRAMUSCULAR | Status: AC
  Administered 2015-03-27: 10 mg via INTRAVENOUS
  Filled 2015-03-27: qty 1

## 2015-03-27 MED ORDER — FENTANYL CITRATE (PF) 100 MCG/2ML IJ SOLN
50.0000 ug | Freq: Once | INTRAMUSCULAR | Status: AC
  Administered 2015-03-27: 50 ug via INTRAVENOUS

## 2015-03-27 MED ORDER — FENTANYL CITRATE (PF) 100 MCG/2ML IJ SOLN
INTRAMUSCULAR | Status: AC | PRN
  Administered 2015-03-26 (×2): 100 ug via INTRAVENOUS

## 2015-03-27 MED ORDER — PROPOFOL 1000 MG/100ML IV EMUL
0.0000 ug/kg/min | INTRAVENOUS | Status: DC
  Administered 2015-03-27: 25 ug/kg/min via INTRAVENOUS

## 2015-03-27 MED ORDER — LACTATED RINGERS IV BOLUS (SEPSIS)
1000.0000 mL | Freq: Once | INTRAVENOUS | Status: AC
  Administered 2015-03-27: 1000 mL via INTRAVENOUS

## 2015-03-27 MED ORDER — ANTISEPTIC ORAL RINSE SOLUTION (CORINZ)
7.0000 mL | Freq: Four times a day (QID) | OROMUCOSAL | Status: DC
  Administered 2015-03-27 – 2015-03-29 (×7): 7 mL via OROMUCOSAL

## 2015-03-27 MED ORDER — VITAL HIGH PROTEIN PO LIQD
1000.0000 mL | ORAL | Status: DC
  Administered 2015-03-27 (×2): 1000 mL
  Administered 2015-03-27 (×3)
  Filled 2015-03-27 (×2): qty 1000

## 2015-03-27 MED ORDER — SODIUM CHLORIDE 0.9 % IV SOLN: INTRAVENOUS | Status: DC | PRN

## 2015-03-27 NOTE — ED Provider Notes (Signed)
CSN: [id number]     Arrival date & time 03/26/15  1720 History   First MD Initiated Contact with Patient 03/26/15 1733     Chief Complaint  Patient presents with  . Drug Overdose  . Shortness of Breath     (Consider location/radiation/quality/duration/timing/severity/associated sxs/prior Treatment) HPI Comments: 60 year old male with history of COPD, cirrhosis, AAA, pulmonary nodules, recent admission for mechanical fall with left-sided rib fractures, pneumothorax, intubation and ICU stay for acute respiratory failure secondary to pneumonia with discharged on January 24, presents with concern initially for unresponsiveness, however with respiratory distress.EMS called out for unresponsiveness after pt took a percocet., and on their arrival, patient was bradypneic, breathing 4 times a minute. They gave him Narcan and patient became alert, however was short of breath and tachypnea, with diminished breath sounds, received duonebs en route to the hospital.   Patient unable to provide sig hx on arrival due to dyspnea, however later reports dyspnea worsening today, improved with albuterol at first. Cough. No known fevers at home. No chest pain. Requesting albuterol on arrival to ED.   Requests methadone. Hx limited per EMS.  Patient is a 60 y.o. male presenting with Overdose and shortness of breath.  Drug Overdose This is a new problem. The current episode started less than 1 hour ago. The problem occurs constantly. Associated symptoms include shortness of breath. Pertinent negatives include no chest pain, no abdominal pain and no headaches. Relieved by: albuterol.  Shortness of Breath Associated symptoms: cough   Associated symptoms: no abdominal pain, no chest pain, no fever and no headaches     Past Medical History  Diagnosis Date  . Chronic back pain   . Hypertension    History reviewed. No pertinent past surgical history. No family history on file. Social History  Substance Use Topics  .  Smoking status: Current Every Day Smoker  . Smokeless tobacco: None  . Alcohol Use: No    Review of Systems  Constitutional: Negative for fever.  Respiratory: Positive for cough and shortness of breath.   Cardiovascular: Positive for leg swelling. Negative for chest pain.  Gastrointestinal: Negative for abdominal pain.  Neurological: Negative for headaches.      Allergies  Review of patient's allergies indicates no known allergies.  Home Medications   Prior to Admission medications   Medication Sig Start Date End Date Taking? Authorizing Provider  amLODipine (NORVASC) 10 MG tablet Take 10 mg by mouth daily.    Historical Provider, MD  methadone (DOLOPHINE) 10 MG/5ML solution Take 35.5 mLs (71 mg total) by mouth daily. 03/23/15   [name], PA-C  naproxen (NAPROSYN) 500 MG tablet Take 1 tablet (500 mg total) by mouth 2 (two) times daily with a meal. 03/23/15   [name], PA-C  oxyCODONE-acetaminophen (PERCOCET) 7.5-325 MG tablet Take 1-2 tablets by mouth every 4 (four) hours as needed. 03/24/15   [name], PA-C  potassium chloride (KLOR-CON) 20 MEQ packet Take 20 mEq by mouth daily.    Historical Provider, MD   BP 100/47 mmHg  Pulse 94  Temp(Src) 99.5 F (37.5 C) (Rectal)  Resp 24  Ht 5\' 11"  (1.803 m)  Wt 186 lb 1.1 oz (84.4 kg)  BMI 25.96 kg/m2  SpO2 99% Physical Exam  Constitutional: He is oriented to person, place, and time. He appears well-developed and well-nourished. No distress.  HENT:  Head: Normocephalic and atraumatic.  Eyes: Conjunctivae and EOM are normal.  Neck: Normal range of motion.  Cardiovascular: Normal rate,  regular rhythm, normal heart sounds and intact distal pulses.  Exam reveals no gallop and no friction rub.   No murmur heard. Pulmonary/Chest: Effort normal. No respiratory distress. He has decreased breath sounds (diminished bilaterally, left worse than right) in the left upper field, the left middle field and the left lower  field. He has wheezes (occasional). He has no rales.  Abdominal: Soft. He exhibits no distension. There is no tenderness. There is no guarding.  Musculoskeletal: He exhibits edema (bilateral lower ext edema, pitting 3+).  Neurological: He is alert and oriented to person, place, and time.  Skin: Skin is warm and dry. He is not diaphoretic.  Nursing note and vitals reviewed.   ED Course  .Central Line Date/Time: 03/27/2015 3:50 AM Performed by: Alvira Monday Authorized by: Alvira Monday Consent: Verbal consent obtained. Written consent obtained. Risks and benefits: risks, benefits and alternatives were discussed Consent given by: patient and spouse Required items: required blood products, implants, devices, and special equipment available Time out: Immediately prior to procedure a "time out" was called to verify the correct patient, procedure, equipment, support staff and site/side marked as required. Indications: vascular access Anesthesia: local infiltration Local anesthetic: lidocaine 1% without epinephrine Anesthetic total: 5 ml Patient sedated: no Preparation: skin prepped with ChloraPrep Skin prep agent dried: skin prep agent completely dried prior to procedure Sterile barriers: all five maximum sterile barriers used - cap, mask, sterile gown, sterile gloves, and large sterile sheet Hand hygiene: hand hygiene performed prior to central venous catheter insertion Location details: right internal jugular Ultrasound guidance: yes Sterile ultrasound techniques: sterile gel and sterile probe covers were used Number of attempts: 1 Successful placement: yes Post-procedure: line sutured and dressing applied Assessment: blood return through all ports,  free fluid flow,  placement verified by x-ray and no pneumothorax on x-ray Patient tolerance: Patient tolerated the procedure well with no immediate complications Comments: RIJ tip many cm past AC junction, critical care to pull  back   (including critical care time) Labs Review Labs Reviewed  COMPREHENSIVE METABOLIC PANEL - Abnormal; Notable for the following:    Chloride 96 (*)    Glucose, Bld 106 (*)    Creatinine, Ser 1.41 (*)    Calcium 8.1 (*)    Albumin 2.3 (*)    AST 145 (*)    Alkaline Phosphatase 233 (*)    Total Bilirubin 1.4 (*)    GFR calc non Af Amer 53 (*)    All other components within normal limits  CBC WITH DIFFERENTIAL/PLATELET - Abnormal; Notable for the following:    WBC 13.6 (*)    RBC 3.81 (*)    Hemoglobin 12.0 (*)    HCT 37.8 (*)    RDW 18.0 (*)    Neutro Abs 11.2 (*)    All other components within normal limits  URINALYSIS, ROUTINE W REFLEX MICROSCOPIC (NOT AT [facility]) - Abnormal; Notable for the following:    Color, Urine AMBER (*)    APPearance CLOUDY (*)    Bilirubin Urine SMALL (*)    Leukocytes, UA TRACE (*)    All other components within normal limits  BRAIN NATRIURETIC PEPTIDE - Abnormal; Notable for the following:    B Natriuretic Peptide 125.1 (*)    All other components within normal limits  PHOSPHORUS - Abnormal; Notable for the following:    Phosphorus 5.9 (*)    All other components within normal limits  BLOOD GAS, VENOUS - Abnormal; Notable for the following:    pH,  Ven 7.219 (*)    Bicarbonate 18.0 (*)    Acid-base deficit 8.9 (*)    All other components within normal limits  CBC - Abnormal; Notable for the following:    WBC 13.2 (*)    RBC 3.22 (*)    Hemoglobin 10.2 (*)    HCT 31.9 (*)    RDW 17.8 (*)    All other components within normal limits  CREATININE, SERUM - Abnormal; Notable for the following:    Creatinine, Ser 1.32 (*)    GFR calc non Af Amer 58 (*)    All other components within normal limits  TROPONIN I - Abnormal; Notable for the following:    Troponin I 2.63 (*)    All other components within normal limits  LACTIC ACID, PLASMA - Abnormal; Notable for the following:    Lactic Acid, Venous 4.0 (*)    All other components within  normal limits  PROTIME-INR - Abnormal; Notable for the following:    Prothrombin Time 20.8 (*)    INR 1.87 (*)    All other components within normal limits  BLOOD GAS, ARTERIAL - Abnormal; Notable for the following:    pH, Arterial 7.277 (*)    pCO2 arterial 48.7 (*)    pO2, Arterial 71.9 (*)    Acid-base deficit 4.3 (*)    All other components within normal limits  HEPATIC FUNCTION PANEL - Abnormal; Notable for the following:    Total Protein 5.3 (*)    Albumin 1.7 (*)    AST 123 (*)    Alkaline Phosphatase 169 (*)    All other components within normal limits  URINE MICROSCOPIC-ADD ON - Abnormal; Notable for the following:    Bacteria, UA FEW (*)    Casts HYALINE CASTS (*)    All other components within normal limits  BLOOD GAS, ARTERIAL - Abnormal; Notable for the following:    pH, Arterial 7.319 (*)    pO2, Arterial 128 (*)    Acid-base deficit 3.1 (*)    All other components within normal limits  I-STAT CG4 LACTIC ACID, ED - Abnormal; Notable for the following:    Lactic Acid, Venous 9.44 (*)    All other components within normal limits  I-STAT TROPOININ, ED - Abnormal; Notable for the following:    Troponin i, poc 0.15 (*)    All other components within normal limits  I-STAT CG4 LACTIC ACID, ED - Abnormal; Notable for the following:    Lactic Acid, Venous 9.10 (*)    All other components within normal limits  I-STAT TROPOININ, ED - Abnormal; Notable for the following:    Troponin i, poc 0.88 (*)    All other components within normal limits  I-STAT CG4 LACTIC ACID, ED - Abnormal; Notable for the following:    Lactic Acid, Venous 4.21 (*)    All other components within normal limits  I-STAT TROPOININ, ED - Abnormal; Notable for the following:    Troponin i, poc 1.12 (*)    All other components within normal limits  CULTURE, BLOOD (ROUTINE X 2)  CULTURE, BLOOD (ROUTINE X 2)  URINE CULTURE  CULTURE, RESPIRATORY (NON-EXPECTORATED)  RESPIRATORY VIRUS PANEL  MAGNESIUM   MAGNESIUM  PHOSPHORUS  LIPASE, BLOOD  APTT  STREP PNEUMONIAE URINARY ANTIGEN  TROPONIN I  TROPONIN I  LACTIC ACID, PLASMA  LACTIC ACID, PLASMA  LACTIC ACID, PLASMA  LEGIONELLA ANTIGEN, URINE  CBC  BASIC METABOLIC PANEL  I-STAT CG4 LACTIC ACID, ED  TYPE AND  SCREEN  ABO/RH    Imaging Review Ct Chest Wo Contrast  03/27/2015  CLINICAL DATA:  Acute onset of progressive difficulty breathing. Status post left-sided chest tube placement. Initial encounter. EXAM: CT CHEST WITHOUT CONTRAST TECHNIQUE: Multidetector CT imaging of the chest was performed following the standard protocol without IV contrast. COMPARISON:  Chest radiograph from 03/26/2015, and CT of the chest performed 03/07/2015 FINDINGS: Status post left-sided chest tube placement, there has been resolution of the previously noted hemopneumothorax. Increasing patchy right-sided airspace opacification raises concern for infection. Mild left basilar airspace opacity is again seen. Underlying emphysematous change is noted bilaterally, more prominent at the upper lung lobes. Small nodules are again noted within the right upper lobe. Left-sided rib fractures are again noted. There is significantly increased displacement of the fractures at the left posterolateral ninth and tenth ribs, each demonstrating 2-3 cm of displacement. Chronic partially healed right-sided rib fractures are again seen. The patient's endotracheal tube is seen ending 4 cm above the carina. An enteric tube is noted extending below the diaphragm. Diffuse coronary artery calcifications are seen. The mediastinum is otherwise unremarkable. No mediastinal lymphadenopathy is seen. No pericardial effusion is identified. The great vessels are grossly unremarkable in appearance. The thyroid gland is grossly unremarkable in appearance. No axillary lymphadenopathy is appreciated. A small amount of ascites is noted tracking about the liver and spleen, new from the prior study. This is  relatively low attenuation. Vague soft tissue inflammation about the gallbladder is nonspecific given the presence of ascites. There is mild nonspecific stranding about the pancreas. Would correlate for any evidence of mild pancreatitis. Scattered calcification is noted along the proximal aorta. A large parenchymal calcification is noted at the upper pole of the left kidney. Chronic compression deformities are again noted at T5 and T9. There is chronic deformity of the body of the sternum. IMPRESSION: 1. Status post placement of left-sided chest tube, there has been resolution of the previously noted hemopneumothorax. 2. Increasing patchy right-sided airspace opacification raises concern for diffuse pneumonia. Mild left basilar airspace opacity again seen. 3. Underlying emphysematous change noted bilaterally, more prominent at the upper lung lobes. Small pulmonary nodules again noted at the right upper lobe. 4. Left-sided rib fractures again seen. Significantly increased displacement of the fractures of the left posterolateral ninth and tenth ribs, each demonstrating 2-3 cm of displacement. 5. Diffuse coronary artery calcifications seen. 6. Small amount of ascites noted tracking about the liver and spleen, new from the prior study. Vague soft tissue inflammation about the gallbladder is nonspecific given the presence of ascites. 7. Mild nonspecific stranding about the pancreas. Would correlate clinically for any evidence of mild pancreatitis. 8. Chronic compression deformities at T5 and T9, and chronic deformity of the body of the sternum. Electronically Signed   By: Roanna Raider M.D.   On: 03/27/2015 02:56   Dg Chest Portable 1 View  03/27/2015  CLINICAL DATA:  Intubation.  Current smoker.  Hypertension. EXAM: PORTABLE CHEST 1 VIEW COMPARISON:  03/26/2015 FINDINGS: Endotracheal tube is been placed with tip measuring 4.1 cm above the carina. Enteric tube has been placed. The tip is below the left hemidiaphragm  but is off the field of view. Right central venous catheter with tip over the low SVC region. Left chest tube is in place. There has been evacuation of most of the previous left pleural effusion. No significant pneumothorax. Patchy diffuse airspace and interstitial disease in both lungs with increasing focal infiltration in the right mid lung. Changes may  represent edema, pneumonia, or ARDS. Heart size and pulmonary vascularity are normal. IMPRESSION: Appliances appear in satisfactory location. Significant decrease of previous left pleural effusion. Diffuse bilateral pulmonary infiltrates with focal increasing infiltration in the right mid lung. Electronically Signed   By: Burman Nieves M.D.   On: 03/27/2015 01:14   Dg Chest Portable 1 View  03/26/2015  CLINICAL DATA:  Acute respiratory failure following trauma and surgery. Multiple rib fractures. Traumatic hemopneumothorax. Central line placement. EXAM: PORTABLE CHEST 1 VIEW COMPARISON:  03/26/2015 FINDINGS: New right jugular central venous catheter is seen with tip overlying the lower right atrium approximately 9 cm below the superior cavoatrial junction. No pneumothorax visualized. Enlarging layering left pleural effusion and left lung atelectasis seen. Diffuse heterogeneous bilateral airspace disease is again demonstrated, likely due to pulmonary edema or ARDS. Cardiomegaly appears stable. IMPRESSION: Right jugular central venous catheter tip overlies the inferior right atrium, approximately 9 cm below expected level of the superior cavoatrial junction. No pneumothorax visualized. Increased size of layering left pleural effusion and left lung atelectasis. No significant change in diffuse heterogeneous bilateral airspace disease. Differential diagnosis includes pulmonary edema and ARDS. Electronically Signed   By: Myles Rosenthal M.D.   On: 03/26/2015 21:05   Dg Chest Portable 1 View  03/26/2015  CLINICAL DATA:  Shortness of breath today.  Subsequent  encounter. EXAM: PORTABLE CHEST 1 VIEW COMPARISON:  Single view of the chest 03/16/2015 and 03/15/2015. FINDINGS: Large left pleural effusion persists. There is new extensive airspace disease throughout the aerated right and left lung. Heart size is enlarged. No pneumothorax. IMPRESSION: New extensive bilateral airspace disease could be due to edema and/or pneumonia. Persistent large left pleural effusion. Electronically Signed   By: Drusilla Kanner M.D.   On: 03/26/2015 18:16   I have personally reviewed and evaluated these images and lab results as part of my medical decision-making.   EKG Interpretation None       CRITICAL CARE: septic shock Performed by: Rhae Lerner   Total critical care time: 45 minutes  Critical care time was exclusive of separately billable procedures and treating other patients.  Critical care was necessary to treat or prevent imminent or life-threatening deterioration.  Critical care was time spent personally by me on the following activities: development of treatment plan with patient and/or surrogate as well as nursing, discussions with consultants, evaluation of patient's response to treatment, examination of patient, obtaining history from patient or surrogate, ordering and performing treatments and interventions, ordering and review of laboratory studies, ordering and review of radiographic studies, pulse oximetry and re-evaluation of patient's condition.   MDM   Final diagnoses:  HCAP (healthcare-associated pneumonia)  Lactic acidosis  Acute respiratory failure with hypoxia and hypercapnia (HCC)  Septic shock [facility])  Hemothorax   60 year old male with history of COPD, cirrhosis, AAA, pulmonary nodules, recent admission for mechanical fall with left-sided rib fractures, pneumothorax, intubation and ICU stay for acute respiratory failure secondary to pneumonia with discharged on January 24, presents with concern initially for  unresponsiveness, however with respiratory distress.  EMS called out for unresponsiveness, and on their arrival, patient was bradypneic, breathing 4 times a minute. They gave him Narcan and patient became alert, however was short of breath and tachypnea, with diminished breath sounds, received duonebs en routes to the hospital. On arrival to the emergency department, patient has increased work of breathing, was unable to provide history, and was initially started on BiPAP for concern of possible COPD exacerbation.  Upon reviewing patient's  history and recent admission, a code sepsis was initiated, and patient was given vancomycin and Zosyn for concern for possible pneumonia. XR shows no pneumothorax however shows effusion, concerning for possible hemothorax given recent trauma. Pt respiratory distress improved on BiPAP, able to talk, alert, maintaining saturations. Lactic acid was 9. Patient was given 30/kg bolus of normal saline, with maps around 60, and was started on levophed.  Repeat XR shows deep IJ line, and worsening opacities. Consulted critical care and agree with decision for intubation and chest tube placement. Patient to be admitted for further care of sepsis secondary to pneumonia, with acute hypoxic respiratory failure secondary to pneumonia, hemothorax, and r/o ARDS.    Alvira Monday, MD 03/27/15 [phone]

## 2015-03-27 NOTE — Procedures (Addendum)
PCCM CHEST TUBE INSERTION  Procedure: Left surgical chest tube Indication: Left Hemothorax on thoracentesis Consent: Phone consent by wife Time out called: YES Patient draped, sterilized and positioned appropriately Anesthesia: Propofol gtt, fentanyl , Local lidocaine 2% without Epi 10cc. Tube size: 32 french The tube was inserted with blood return and fog in the tube.  The tube rotated appropriately and suction was attached at 40cmH20.  Drainage 2L bloody fluid.  EBL: 2.1L CXR: confirm tube placement and space was evacuated effectively. Chest tube sutured in place and secured with dressing and adhesive tape. Patient tolerated procedure well.  Galvin Proffer, DO., MS  Pulmonary and Critical Care Medicine

## 2015-03-27 NOTE — Progress Notes (Signed)
eLink Physician-Brief Progress Note Patient Name: Benjamin Bell DOB: 1955/03/02 MRN: [id number]   Date of Service  03/27/2015  HPI/Events of Note  Patient is not able to tolerate the replacement of his OGT/NGT.  eICU Interventions  Will order: 1. Versed 2 mg IV now X 1. 2. Retry placement of gastric tube post Versed.      Intervention Category Minor Interventions: Agitation / anxiety - evaluation and management  Sommer,Steven Eugene 03/27/2015, 11:00 PM

## 2015-03-27 NOTE — Progress Notes (Signed)
Pharmacy Antibiotic Follow-up Note  Benjamin Bell is a 60 y.o. year-old male admitted on 03/26/2015.  The patient is currently on day 2 of vancomycin and cefepime for pneumonia.  Assessment/Plan: Adjust vancomycin to  IV q12h for change in renal function Continue cefepime 1gm IV q8h  Temp (24hrs), Avg:99.1 F (37.3 C), Min:95.9 F (35.5 C), Max:99.5 F (37.5 C)   Recent Labs Lab 03/26/15 1738 03/27/15 0139 03/27/15 0400  WBC 13.6* 13.2* 15.6*    Recent Labs Lab 03/21/15 0418 03/26/15 1738 03/27/15 0139 03/27/15 0400  CREATININE 0.92 1.41* 1.32* 1.25*   Estimated Creatinine Clearance: 68.6 mL/min (by C-G formula based on Cr of 1.25).    No Known Allergies  Antimicrobials this admission: 1/27 >> vanc >> 1/27 >> cefepime >>  Microbiology results: 1/27  BCx: collected 1/27 UCx: sent  Thank you for allowing pharmacy to be a part of this patient's care.  Arley Phenix RPh 03/27/2015, 10:52 AM Pager [phone]

## 2015-03-27 NOTE — H&P (Signed)
PULMONARY / CRITICAL CARE MEDICINE   Name: Benjamin Bell MRN: [id number] DOB: 1955-03-31    ADMISSION DATE:  03/26/2015  CHIEF COMPLAINT:  SOB  HISTORY OF PRESENT ILLNESS:   60yo M with MMP including AAA, pulmonary nodules, hepatic cirrhosis recently d/ced from [facility] on 03/24/2015 after a mechanical fall, multiple left rib fractures with left traumatic hydropneumothorax intubated development of right UL pneumonia and Left sided pleural effusion.  He was discharged and states that since that time he has been severely SOB with cough and subjective fevers and chills.   EMS was called by his wife and he was found unresponsive.  He awoke with a dose of narcan but upon arrival to the ED he became progressively hypoxemic requiring Bipap eventually. ETT need, hemothorax drained with CT.   SUBJECTIVE: hemothorax CT placed, 2.2 liters out blood  VITAL SIGNS: Temp:  [95.9 F (35.5 C)-99.5 F (37.5 C)] 98.8 F (37.1 C) (01/28 0500) Pulse Rate:  [89-129] 94 (01/28 0215) Resp:  [9-32] 24 (01/28 0500) BP: (58-162)/(39-108) 109/57 mmHg (01/28 0500) SpO2:  [69 %-100 %] 99 % (01/28 0500) FiO2 (%):  [50 %-100 %] 70 % (01/28 0400) Weight:  [68.04 kg (150 lb)-84.4 kg (186 lb 1.1 oz)] 84.4 kg (186 lb 1.1 oz) (01/28 0318) HEMODYNAMICS:   VENTILATOR SETTINGS: Vent Mode:  [-] PRVC FiO2 (%):  [50 %-100 %] 70 % Set Rate:  [20 bmp-24 bmp] 24 bmp Vt Set:  [450 mL] 450 mL PEEP:  [10 cmH20-14 cmH20] 12 cmH20 Plateau Pressure:  [19 cmH20-23 cmH20] 22 cmH20 INTAKE / OUTPUT:  Intake/Output Summary (Last 24 hours) at 03/27/15 0724 Last data filed at 03/27/15 0500  Gross per 24 hour  Intake 6130.88 ml  Output   1950 ml  Net 4180.88 ml    PHYSICAL EXAMINATION: General: rass -3 Neuro:  FC, per, rass -3 HEENT:  Edentulous, jvd wnl Cardiovascular:  s1 s2 RRR no m  Lungs:  Reduced bases, clear Abdomen:  Soft, bs wnl, less distended, no r Musculoskeletal:  Normal bulk and tone, no edema Skin:  No  rash  LABS:  CBC  Recent Labs Lab 03/26/15 1738 03/27/15 0139 03/27/15 0400  WBC 13.6* 13.2* 15.6*  HGB 12.0* 10.2* 10.3*  HCT 37.8* 31.9* 32.4*  PLT 269 260 295   Coag's  Recent Labs Lab 03/27/15 0139  APTT 30  INR 1.87*   BMET  Recent Labs Lab 03/21/15 0418 03/26/15 1738 03/27/15 0139 03/27/15 0400  NA 141 135  --  136  K 3.5 4.4  --  4.0  CL 101 96*  --  103  CO2 36* 25  --  23  BUN 18 17  --  21*  CREATININE 0.92 1.41* 1.32* 1.25*  GLUCOSE 108* 106*  --  190*   Electrolytes  Recent Labs Lab 03/21/15 0418 03/26/15 1738 03/27/15 0139 03/27/15 0400  CALCIUM 7.6* 8.1*  --  7.3*  MG  --  1.9 1.9  --   PHOS  --  5.9* 3.7  --    Sepsis Markers  Recent Labs Lab 03/27/15 0140 03/27/15 0153 03/27/15 0400  LATICACIDVEN 4.0* 4.21* 4.1*   ABG  Recent Labs Lab 03/27/15 0006 03/27/15 0115  PHART 7.277* 7.319*  PCO2ART 48.7* 44.7  PO2ART 71.9* 128*   Liver Enzymes  Recent Labs Lab 03/26/15 1738 03/27/15 0139  AST 145* 123*  ALT 63 60  ALKPHOS 233* 169*  BILITOT 1.4* 0.8  ALBUMIN 2.3* 1.7*   Cardiac Enzymes  Recent Labs  Lab 03/27/15 0139 03/27/15 0400  TROPONINI 2.63* 2.36*   Glucose  Recent Labs Lab 03/20/15 2019 03/20/15 2255  GLUCAP 142* 160*    Imaging Ct Chest Wo Contrast  03/27/2015  CLINICAL DATA:  Acute onset of progressive difficulty breathing. Status post left-sided chest tube placement. Initial encounter. EXAM: CT CHEST WITHOUT CONTRAST TECHNIQUE: Multidetector CT imaging of the chest was performed following the standard protocol without IV contrast. COMPARISON:  Chest radiograph from 03/26/2015, and CT of the chest performed 03/07/2015 FINDINGS: Status post left-sided chest tube placement, there has been resolution of the previously noted hemopneumothorax. Increasing patchy right-sided airspace opacification raises concern for infection. Mild left basilar airspace opacity is again seen. Underlying emphysematous  change is noted bilaterally, more prominent at the upper lung lobes. Small nodules are again noted within the right upper lobe. Left-sided rib fractures are again noted. There is significantly increased displacement of the fractures at the left posterolateral ninth and tenth ribs, each demonstrating 2-3 cm of displacement. Chronic partially healed right-sided rib fractures are again seen. The patient's endotracheal tube is seen ending 4 cm above the carina. An enteric tube is noted extending below the diaphragm. Diffuse coronary artery calcifications are seen. The mediastinum is otherwise unremarkable. No mediastinal lymphadenopathy is seen. No pericardial effusion is identified. The great vessels are grossly unremarkable in appearance. The thyroid gland is grossly unremarkable in appearance. No axillary lymphadenopathy is appreciated. A small amount of ascites is noted tracking about the liver and spleen, new from the prior study. This is relatively low attenuation. Vague soft tissue inflammation about the gallbladder is nonspecific given the presence of ascites. There is mild nonspecific stranding about the pancreas. Would correlate for any evidence of mild pancreatitis. Scattered calcification is noted along the proximal aorta. A large parenchymal calcification is noted at the upper pole of the left kidney. Chronic compression deformities are again noted at T5 and T9. There is chronic deformity of the body of the sternum. IMPRESSION: 1. Status post placement of left-sided chest tube, there has been resolution of the previously noted hemopneumothorax. 2. Increasing patchy right-sided airspace opacification raises concern for diffuse pneumonia. Mild left basilar airspace opacity again seen. 3. Underlying emphysematous change noted bilaterally, more prominent at the upper lung lobes. Small pulmonary nodules again noted at the right upper lobe. 4. Left-sided rib fractures again seen. Significantly increased  displacement of the fractures of the left posterolateral ninth and tenth ribs, each demonstrating 2-3 cm of displacement. 5. Diffuse coronary artery calcifications seen. 6. Small amount of ascites noted tracking about the liver and spleen, new from the prior study. Vague soft tissue inflammation about the gallbladder is nonspecific given the presence of ascites. 7. Mild nonspecific stranding about the pancreas. Would correlate clinically for any evidence of mild pancreatitis. 8. Chronic compression deformities at T5 and T9, and chronic deformity of the body of the sternum. Electronically Signed   By: Roanna Raider M.D.   On: 03/27/2015 02:56   Dg Chest Portable 1 View  03/27/2015  CLINICAL DATA:  Intubation.  Current smoker.  Hypertension. EXAM: PORTABLE CHEST 1 VIEW COMPARISON:  03/26/2015 FINDINGS: Endotracheal tube is been placed with tip measuring 4.1 cm above the carina. Enteric tube has been placed. The tip is below the left hemidiaphragm but is off the field of view. Right central venous catheter with tip over the low SVC region. Left chest tube is in place. There has been evacuation of most of the previous left pleural effusion. No significant pneumothorax.  Patchy diffuse airspace and interstitial disease in both lungs with increasing focal infiltration in the right mid lung. Changes may represent edema, pneumonia, or ARDS. Heart size and pulmonary vascularity are normal. IMPRESSION: Appliances appear in satisfactory location. Significant decrease of previous left pleural effusion. Diffuse bilateral pulmonary infiltrates with focal increasing infiltration in the right mid lung. Electronically Signed   By: Burman Nieves M.D.   On: 03/27/2015 01:14   Dg Chest Portable 1 View  03/26/2015  CLINICAL DATA:  Acute respiratory failure following trauma and surgery. Multiple rib fractures. Traumatic hemopneumothorax. Central line placement. EXAM: PORTABLE CHEST 1 VIEW COMPARISON:  03/26/2015 FINDINGS: New  right jugular central venous catheter is seen with tip overlying the lower right atrium approximately 9 cm below the superior cavoatrial junction. No pneumothorax visualized. Enlarging layering left pleural effusion and left lung atelectasis seen. Diffuse heterogeneous bilateral airspace disease is again demonstrated, likely due to pulmonary edema or ARDS. Cardiomegaly appears stable. IMPRESSION: Right jugular central venous catheter tip overlies the inferior right atrium, approximately 9 cm below expected level of the superior cavoatrial junction. No pneumothorax visualized. Increased size of layering left pleural effusion and left lung atelectasis. No significant change in diffuse heterogeneous bilateral airspace disease. Differential diagnosis includes pulmonary edema and ARDS. Electronically Signed   By: Myles Rosenthal M.D.   On: 03/26/2015 21:05   Dg Chest Portable 1 View  03/26/2015  CLINICAL DATA:  Shortness of breath today.  Subsequent encounter. EXAM: PORTABLE CHEST 1 VIEW COMPARISON:  Single view of the chest 03/16/2015 and 03/15/2015. FINDINGS: Large left pleural effusion persists. There is new extensive airspace disease throughout the aerated right and left lung. Heart size is enlarged. No pneumothorax. IMPRESSION: New extensive bilateral airspace disease could be due to edema and/or pneumonia. Persistent large left pleural effusion. Electronically Signed   By: Drusilla Kanner M.D.   On: 03/26/2015 18:16   Bedside diagnostic thoracentesis: frank blood.  ASSESSMENT / PLAN:  60 yo male with acute hypoxemic respiratory failure due to ARDS, likely due to HCAP compounded by large left sided pleural effusion on CXR found to have septic shock, ARDS and left sided hemothorax and NSTEMI from likely demand ischemia.  PULMONARY A: Acute hypoxemic respiratory failure 2/2 ARDS from HCAP compounded by large Left hemothorax.  Multiple old, displaced rib fractures left rib cage from trauma 2 weeks ago. P:    - 2 liters initially out, only 200 since shift overnight., go to water seal / gravity -repeat pcxr in 4 hrs -ct reviewed, aorta -abg reviewed on higher MV, repeat abg -po2 128, reduce peep to goal 8 , goal 60% -pcxr in am  -see ID  CARDIOVASCULAR A: Septic shock with demand cardiac ischemia and resulting Type II NSTEMI P:  - No anticoagulation given chest bleed - Volume resucitation with LR 30cc/kg, ensure cvp goal 10-12 - Norepinephrine titrated to MAP >60 - trending troponins - echo -cortisol then stress roids -high dose levo, place aline, add vaso  RENAL A:  AKI likely 2/2 shock / SIRS Peak Scr 1.4, baseline 0.92 P:   - need cvp -may need fluids at higher rate bmet in am   GASTROINTESTINAL A:  Mild abdominal distention resolved after CT placed P:   - trend LFTs - volume resuscitation, await cvp -start feeds  HEMATOLOGIC A:  Leukocytosis with chronic anemia (likely 2/2 chronic disease), coagulapthy P:  - trend CBC q12h -vit k , consider ffp  INFECTIOUS A:  PNA, HCAP P:   BCx2 1/27  Sputum 1/27 pleural 1/28>>>  1/27 Vancomycin>>> 1/27 zosyn>>>  ENDOCRINE A:    No active issues P:   - SSI - Goal BG <180  NEUROLOGIC A:  Sedated on propofol with PRN fentanyl P:   RASS goal: 0 Propofol dc ion setting shock fent add  Ccm time 35 min   Mcarthur Rossetti. Tyson Alias, MD, FACP Pgr: [phone]  Pulmonary & Critical Care

## 2015-03-27 NOTE — Procedures (Signed)
INTUBATION PROCEDURE NOTE  Indication: Hypoxemic respiratory failure Consent: yes Time Out: yes Medications: versed, etomidate, succinylcholine Paralytic/RSI: no Technique: DL Blade: MAC 3 Cords Visualized: yes View: Grade 1 # of attempts: 1 Tube confirmation:   Chest rise: yes  Bilateral Breath Sounds: yes  Color change on CO2 detector: yes  ETCO2: yes  CXR: Yest Successful placement: yes    Galvin Proffer, DO., MS [facility]

## 2015-03-27 NOTE — Procedures (Signed)
PCCM THORACENTESIS  Procedure: Left diagnostic thoracentesis Indication: Left pleural effusion with recent trauma Consent: Phone consent by wife Time out called: YES Patient draped and positioned appropriately.  Anesthesia: Propofol gtt, fentanyl , Local lidocaine 2% without Epi 10cc. [place] utilized to locate fluid: YES Drainage 10L bloody fluid.  EBL: 10cc Surgical chest tube inserted shortly after  Galvin Proffer, DO., MS [facility]

## 2015-03-27 NOTE — Sedation Documentation (Signed)
Patient intubated 7.5 tube 22 at lip Positive color change

## 2015-03-27 NOTE — Progress Notes (Signed)
Patient pulled out his OG-tube, patient seems not to be aware that he pulled it out. Attempt was made to put the tube back but patient could not tolerate the procedure- he was gaging and became agitated. E-link MD was notified, order for a one time dose of  versed was given. MD advised that tube could be placed nasally if patient is unable to tolerate oral route.

## 2015-03-27 NOTE — Sedation Documentation (Signed)
Dr. Tyson Alias placed 30 F chest tube to left chest. Immediate return of 1500 cc of bloody drainage.

## 2015-03-27 NOTE — Progress Notes (Signed)
RT x 2 attempted to place A-Line on LR and RR and were unsuccessful. RN aware.

## 2015-03-27 NOTE — Sedation Documentation (Signed)
Left thoracentesis performed by Dr. Tyson Alias. Grossly bloody fluid obtained. Dr. Tyson Alias to place chest tube. Thoracentesis not continued.

## 2015-03-27 NOTE — ED Notes (Signed)
Blood to be drawn from central line

## 2015-03-28 ENCOUNTER — Inpatient Hospital Stay (HOSPITAL_COMMUNITY): Payer: Medicaid Other

## 2015-03-28 DIAGNOSIS — J9602 Acute respiratory failure with hypercapnia: Secondary | ICD-10-CM | POA: Insufficient documentation

## 2015-03-28 DIAGNOSIS — E872 Acidosis: Secondary | ICD-10-CM

## 2015-03-28 LAB — BLOOD GAS, ARTERIAL
ACID-BASE EXCESS: 2.3 mmol/L — AB (ref 0.0–2.0)
BICARBONATE: 27.2 meq/L — AB (ref 20.0–24.0)
Drawn by: 308601
FIO2: 0.4
LHR: 24 {breaths}/min
O2 SAT: 97 %
PEEP/CPAP: 5 cmH2O
Patient temperature: 36.9
TCO2: 25.5 mmol/L (ref 0–100)
VT: 450 mL
pCO2 arterial: 46.2 mmHg — ABNORMAL HIGH (ref 35.0–45.0)
pH, Arterial: 7.387 (ref 7.350–7.450)
pO2, Arterial: 91.7 mmHg (ref 80.0–100.0)

## 2015-03-28 LAB — CBC WITH DIFFERENTIAL/PLATELET
Basophils Absolute: 0 10*3/uL (ref 0.0–0.1)
Basophils Relative: 0 %
EOS ABS: 0 10*3/uL (ref 0.0–0.7)
EOS PCT: 0 %
HCT: 30.2 % — ABNORMAL LOW (ref 39.0–52.0)
HEMOGLOBIN: 9.5 g/dL — AB (ref 13.0–17.0)
LYMPHS ABS: 0.7 10*3/uL (ref 0.7–4.0)
LYMPHS PCT: 13 %
MCH: 31.3 pg (ref 26.0–34.0)
MCHC: 31.5 g/dL (ref 30.0–36.0)
MCV: 99.3 fL (ref 78.0–100.0)
MONOS PCT: 9 %
Monocytes Absolute: 0.5 10*3/uL (ref 0.1–1.0)
NEUTROS PCT: 78 %
Neutro Abs: 4 10*3/uL (ref 1.7–7.7)
PLATELETS: 173 10*3/uL (ref 150–400)
RBC: 3.04 MIL/uL — AB (ref 4.22–5.81)
RDW: 17.9 % — ABNORMAL HIGH (ref 11.5–15.5)
WBC: 5.2 10*3/uL (ref 4.0–10.5)

## 2015-03-28 LAB — COMPREHENSIVE METABOLIC PANEL
ALK PHOS: 155 U/L — AB (ref 38–126)
ALT: 54 U/L (ref 17–63)
AST: 118 U/L — ABNORMAL HIGH (ref 15–41)
Albumin: 1.9 g/dL — ABNORMAL LOW (ref 3.5–5.0)
Anion gap: 6 (ref 5–15)
BILIRUBIN TOTAL: 0.6 mg/dL (ref 0.3–1.2)
BUN: 33 mg/dL — ABNORMAL HIGH (ref 6–20)
CALCIUM: 7.9 mg/dL — AB (ref 8.9–10.3)
CO2: 27 mmol/L (ref 22–32)
CREATININE: 1.07 mg/dL (ref 0.61–1.24)
Chloride: 105 mmol/L (ref 101–111)
GFR calc non Af Amer: >60 mL/min (ref >60)
Glucose, Bld: 86 mg/dL (ref 65–99)
Potassium: 3.8 mmol/L (ref 3.5–5.1)
SODIUM: 138 mmol/L (ref 135–145)
TOTAL PROTEIN: 5.4 g/dL — AB (ref 6.5–8.1)

## 2015-03-28 LAB — URINE CULTURE

## 2015-03-28 LAB — GLUCOSE, CAPILLARY
GLUCOSE-CAPILLARY: 88 mg/dL (ref 65–99)
Glucose-Capillary: 100 mg/dL — ABNORMAL HIGH (ref 65–99)
Glucose-Capillary: 66 mg/dL (ref 65–99)
Glucose-Capillary: 72 mg/dL (ref 65–99)

## 2015-03-28 MED ORDER — CEFTRIAXONE SODIUM 1 G IJ SOLR
1.0000 g | INTRAMUSCULAR | Status: DC
  Administered 2015-03-28 – 2015-03-30 (×3): 1 g via INTRAVENOUS
  Filled 2015-03-28 (×3): qty 10

## 2015-03-28 MED ORDER — FUROSEMIDE 10 MG/ML IJ SOLN
40.0000 mg | Freq: Two times a day (BID) | INTRAMUSCULAR | Status: DC
  Administered 2015-03-28 – 2015-03-29 (×3): 40 mg via INTRAVENOUS
  Filled 2015-03-28 (×3): qty 4

## 2015-03-28 MED ORDER — OXYCODONE-ACETAMINOPHEN 5-325 MG PO TABS
2.0000 | ORAL_TABLET | ORAL | Status: DC | PRN
  Administered 2015-03-28 – 2015-03-30 (×12): 2 via ORAL
  Filled 2015-03-28 (×12): qty 2

## 2015-03-28 MED ORDER — MIDAZOLAM HCL 2 MG/2ML IJ SOLN: 1.0000 mg | INTRAMUSCULAR | Status: DC | PRN

## 2015-03-28 NOTE — Progress Notes (Signed)
eLink Physician-Brief Progress Note Patient Name: Rhyker Silversmith DOB: [birth year] MRN: [id number]   Date of Service  03/28/2015  HPI/Events of Note  Chest wall pain from chest tube now off IV fentanyl  eICU Interventions  Percocet prn         Sandrea Hughs 03/28/2015, 3:50 PM

## 2015-03-28 NOTE — H&P (Signed)
PULMONARY / CRITICAL CARE MEDICINE   Name: Kypton Eltringham MRN: [id number] DOB: 1955-11-12    ADMISSION DATE:  03/26/2015  CHIEF COMPLAINT:  SOB  HISTORY OF PRESENT ILLNESS:   60yo M with MMP including AAA, pulmonary nodules, hepatic cirrhosis recently d/ced from [facility] on 03/24/2015 after a mechanical fall, multiple left rib fractures with left traumatic hydropneumothorax intubated development of right UL pneumonia and Left sided pleural effusion.  He was discharged and states that since that time he has been severely SOB with cough and subjective fevers and chills.   EMS was called by his wife and he was found unresponsive.  He awoke with a dose of narcan but upon arrival to the ED he became progressively hypoxemic requiring Bipap eventually. ETT need, hemothorax drained with CT.   SUBJECTIVE: anxiety, pressors off!!, low output from CT  VITAL SIGNS: Temp:  [95.7 F (35.4 C)-100 F (37.8 C)] 98.2 F (36.8 C) (01/29 0800) Pulse Rate:  [82-104] 94 (01/29 0828) Resp:  [13-33] 13 (01/29 0828) BP: (87-148)/(48-84) 123/67 mmHg (01/29 0828) SpO2:  [94 %-99 %] 99 % (01/29 0828) FiO2 (%):  [40 %-70 %] 40 % (01/29 0828) Weight:  [85 kg (187 lb 6.3 oz)] 85 kg (187 lb 6.3 oz) (01/29 0306) HEMODYNAMICS: CVP:  [15 mmHg-22 mmHg] 16 mmHg VENTILATOR SETTINGS: Vent Mode:  [-] PSV;PRVC FiO2 (%):  [40 %-70 %] 40 % Set Rate:  [24 bmp] 24 bmp Vt Set:  [450 mL] 450 mL PEEP:  [5 cmH20-8 cmH20] 5 cmH20 Pressure Support:  [5 cmH20] 5 cmH20 Plateau Pressure:  [14 cmH20-23 cmH20] 16 cmH20 INTAKE / OUTPUT:  Intake/Output Summary (Last 24 hours) at 03/28/15 0851 Last data filed at 03/28/15 0700  Gross per 24 hour  Intake 2141.28 ml  Output   1813 ml  Net 328.28 ml    PHYSICAL EXAMINATION: General: rass 0 Neuro:  FC, anxiety, rass 1-2 HEENT:  Edentulous, jvd wnl Cardiovascular:  s1 s2 RRR no m  Lungs: reduced to cta Abdomen:  Soft, bs wnl, mild distention, no r Musculoskeletal:  Normal bulk and tone,  no edema Skin:  No rash  LABS:  CBC  Recent Labs Lab 03/27/15 0400 03/27/15 1805 03/28/15 0424  WBC 15.6* 8.0 5.2  HGB 10.3* 10.1* 9.5*  HCT 32.4* 31.5* 30.2*  PLT 295 207 173   Coag's  Recent Labs Lab 03/27/15 0139  APTT 30  INR 1.87*   BMET  Recent Labs Lab 03/26/15 1738 03/27/15 0139 03/27/15 0400 03/28/15 0424  NA 135  --  136 138  K 4.4  --  4.0 3.8  CL 96*  --  103 105  CO2 25  --  23 27  BUN 17  --  21* 33*  CREATININE 1.41* 1.32* 1.25* 1.07  GLUCOSE 106*  --  190* 86   Electrolytes  Recent Labs Lab 03/26/15 1738 03/27/15 0139 03/27/15 0400 03/28/15 0424  CALCIUM 8.1*  --  7.3* 7.9*  MG 1.9 1.9  --   --   PHOS 5.9* 3.7  --   --    Sepsis Markers  Recent Labs Lab 03/27/15 0400 03/27/15 0850 03/27/15 1215  LATICACIDVEN 4.1* 2.6* 1.6   ABG  Recent Labs Lab 03/27/15 0115 03/27/15 0755 03/28/15 0452  PHART 7.319* 7.372 7.387  PCO2ART 44.7 45.3* 46.2*  PO2ART 128* 126* 91.7   Liver Enzymes  Recent Labs Lab 03/26/15 1738 03/27/15 0139 03/28/15 0424  AST 145* 123* 118*  ALT 63 60 54  ALKPHOS 233*  169* 155*  BILITOT 1.4* 0.8 0.6  ALBUMIN 2.3* 1.7* 1.9*   Cardiac Enzymes  Recent Labs Lab 03/27/15 0139 03/27/15 0400 03/27/15 1030  TROPONINI 2.63* 2.36* 2.52*   Glucose  Recent Labs Lab 03/27/15 1218 03/27/15 1649 03/27/15 2018 03/28/15 0049 03/28/15 0757  GLUCAP 89 100* 80 72 88    Imaging Dg Chest Port 1 View  03/28/2015  CLINICAL DATA:  Hemothorax. EXAM: PORTABLE CHEST 1 VIEW COMPARISON:  03/26/2015 FINDINGS: Left chest tube remains in place. Small amount of subcutaneous emphysema. Tiny left apical pneumothorax. Remainder the support devices are unchanged. Patchy bilateral airspace disease again noted with slight improvement at the left base, otherwise no change. Heart is borderline in size. IMPRESSION: Left chest tube remains in place with small amount of subcutaneous air and tiny left apical pneumothorax.  Patchy bilateral airspace disease with improved aeration at the left base. Electronically Signed   By: Charlett Nose M.D.   On: 03/28/2015 07:03   Dg Abd Portable 1v  03/28/2015  CLINICAL DATA:  Impaired NG feeding tube. EXAM: PORTABLE ABDOMEN - 1 VIEW COMPARISON:  Abdomen 03/27/2015.  CT chest 03/27/2015. FINDINGS: Enteric tube tip is in the left upper quadrant consistent with location in the body of the stomach. Calcification in the left upper quadrant probably representing a renal stone or stones. Surgical clips in the mid abdomen. Small left pneumothorax is demonstrated. Fractures of left lower ribs. Previous CT chest demonstrates chest tube in place. IMPRESSION: Enteric tube tip is in the left upper quadrant consistent with location in the body of the stomach. Known left pneumothorax is identified. Electronically Signed   By: Burman Nieves M.D.   On: 03/28/2015 00:43   Dg Abd Portable 1v  03/27/2015  CLINICAL DATA:  OG tube placement EXAM: PORTABLE ABDOMEN - 1 VIEW COMPARISON:  CT 03/07/2015 FINDINGS: OG tube tip is in the mid stomach with the side port in the proximal stomach. Gas throughout nondistended large and small bowel. Stones project over the upper pole of the left kidney as seen on prior CT. IMPRESSION: OG tube tip in the mid stomach. Electronically Signed   By: Charlett Nose M.D.   On: 03/27/2015 12:08   Bedside diagnostic thoracentesis: frank blood.  ASSESSMENT / PLAN:  61 yo male with acute hypoxemic respiratory failure due to ARDS, likely due to HCAP compounded by large left sided pleural effusion on CXR found to have septic shock, ARDS and left sided hemothorax and NSTEMI from likely demand ischemia.  PULMONARY A: Acute hypoxemic respiratory failure 2/2 ARDS from HCAP compounded by large Left hemothorax.   Multiple old, displaced rib fractures left rib cage from trauma 2 weeks ago. P:   - min output from CT, consider dc after d/w trauma -improved rt infiltrates -wean cpap 5  ps 5, goal 30 min, assess rsbi -upright -dc fent drip -consider lasix  CARDIOVASCULAR A: shock (hypovolemic, sepsis?)with demand cardiac ischemia and resulting Type II NSTEMI, off all pressors No rel ai P:  - No anticoagulation given chest bleed - echo reviewed -stress roids - dc -lasix, cvp up  RENAL A:  AKI likely 2/2 shock / SIRS Peak - resolved P:   - lasix Chem in am  -kvo  GASTROINTESTINAL A:  Mild abdominal distention resolved after CT placed P:   -ppi -start feeds, hold if weaning  HEMATOLOGIC A:  Leukocytosis with chronic anemia (likely 2/2 chronic disease), coagulapthy P:  - trend CBC in am  -no further bleeding  INFECTIOUS A:  PNA, HCAP P:   BCx2 1/27 Sputum 1/27 pleural 1/28>>> Culture neg, narrow, consider 7 days abx  1/27 Vancomycin>>>1/29 1/27 zosyn>>>1/28 1/28 cefepime>>>1/29 1/29 ceftriaxone>>>  ENDOCRINE A:    No active issues P:   - SSI - Goal BG <180  NEUROLOGIC A:  enceph P:   RASS goal: 0 Propofol dc fent goal to off May need lactulose May need low low low dose versed  Ccm time 30- min   Mcarthur Rossetti. Tyson Alias, MD, FACP Pgr: [phone] [facility]

## 2015-03-28 NOTE — Progress Notes (Signed)
Pt was found pulling on his replaced NG-tube, despite having safety mittens on, E-link MD was notified of the possibility of patient pilling the tube out again- order for a bilateral soft wrist restraints was received.

## 2015-03-28 NOTE — Procedures (Signed)
Extubation Procedure Note  Patient Details:   Name: Benjamin Bell DOB: 15-Nov-1955 MRN: [id number]   Airway Documentation:  Airway 7.5 mm (Active)  Secured at (cm) 23 cm 03/28/2015  8:28 AM  Measured From Lips 03/28/2015  8:28 AM  Secured Location Left 03/28/2015  8:28 AM  Secured By [employer] 03/28/2015  8:28 AM  Tube Holder Repositioned Yes 03/28/2015  8:28 AM  Cuff Pressure (cm H2O) 24 cm H2O 03/27/2015  8:12 PM  Site Condition Dry 03/28/2015  8:28 AM    Evaluation  O2 sats: stable throughout Complications: No apparent complications Patient did tolerate procedure well. Bilateral Breath Sounds: Diminished Suctioning: Oral, Airway Yes   Patient placed on 2 L [place]. PAtient vitals stable at this time    Dannielle Karvonen 03/28/2015, 9:44 AM

## 2015-03-28 NOTE — Progress Notes (Signed)
Utilization review completed.  

## 2015-03-28 NOTE — Progress Notes (Signed)
Initial Nutrition Assessment  INTERVENTION:   Diet advancement per MD If patient's diet is unable to be advanced, TF recommendations provided.   Recommendations: Initiate Vital AF 1.2 @ 20 ml/hr and increase by 10 ml every 4 hours to goal rate of 50 ml/hr.  30 ml Prostat BID.   Tube feeding regimen provides 2000 kcal (95% of needs), 111 grams of protein, and 917 ml of H2O.   RD to continue to monitor  NUTRITION DIAGNOSIS:   Inadequate oral intake related to inability to eat, dysphagia as evidenced by NPO status.  GOAL:   Patient will meet greater than or equal to 90% of their needs  MONITOR:   Diet advancement, Labs, Weight trends, Skin, I & O's  REASON FOR ASSESSMENT:   Consult Enteral/tube feeding initiation and management  ASSESSMENT:   60yo M with MMP including AAA, pulmonary nodules, hepatic cirrhosis recently d/ced from [facility] on 03/24/2015 after a mechanical fall, multiple left rib fractures with left traumatic hydropneumothorax intubated development of right UL pneumonia and Left sided pleural effusion. He was discharged and states that since that time he has been severely SOB with cough and subjective fevers and chills.  RD was consulted to adjust TF orders but pt was extubated this AM. If patient is to remain extubated, will monitor for diet advancement. RD provided TF recommendations if pt is unable to have diet advanced. Per previous admission notes (1/11-1/23), pt was placed on dysphagia 3 diet with nectar thick liquids with good tolerance. Will monitor for findings from swallow eval.  Nutrition focused physical exam shows no sign of depletion of muscle mass or body fat.  Labs reviewed: Elevated BUN  Diet Order:  Diet NPO time specified  Skin:  Wound (see comment) (Stage II scrotum and stage I perineal ulcer)  Last BM:  PTA  Height:   Ht Readings from Last 1 Encounters:  03/27/15  (1.803 m)    Weight:   Wt Readings from Last 1 Encounters:   03/28/15 187 lb 6.3 oz (85 kg)    Ideal Body Weight:  109 kg  BMI:  Body mass index is 26.15 kg/(m^2).  Estimated Nutritional Needs:   Kcal:  2100-2300  Protein:  105-115g  Fluid:  2L/day  EDUCATION NEEDS:   No education needs identified at this time  Tilda Franco, MS, RD, LDN Pager: [phone] After Hours Pager: [phone]

## 2015-03-29 ENCOUNTER — Inpatient Hospital Stay (HOSPITAL_COMMUNITY): Payer: Medicaid Other

## 2015-03-29 DIAGNOSIS — J432 Centrilobular emphysema: Secondary | ICD-10-CM

## 2015-03-29 DIAGNOSIS — R52 Pain, unspecified: Secondary | ICD-10-CM

## 2015-03-29 LAB — COMPREHENSIVE METABOLIC PANEL
ALT: 68 U/L — ABNORMAL HIGH (ref 17–63)
ANION GAP: 6 (ref 5–15)
AST: 142 U/L — AB (ref 15–41)
Albumin: 1.9 g/dL — ABNORMAL LOW (ref 3.5–5.0)
Alkaline Phosphatase: 151 U/L — ABNORMAL HIGH (ref 38–126)
BILIRUBIN TOTAL: 0.9 mg/dL (ref 0.3–1.2)
BUN: 35 mg/dL — ABNORMAL HIGH (ref 6–20)
CHLORIDE: 103 mmol/L (ref 101–111)
CO2: 31 mmol/L (ref 22–32)
Calcium: 7.5 mg/dL — ABNORMAL LOW (ref 8.9–10.3)
Creatinine, Ser: 0.97 mg/dL (ref 0.61–1.24)
GFR calc Af Amer: >60 mL/min (ref >60)
Glucose, Bld: 63 mg/dL — ABNORMAL LOW (ref 65–99)
POTASSIUM: 3 mmol/L — AB (ref 3.5–5.1)
Sodium: 140 mmol/L (ref 135–145)
TOTAL PROTEIN: 5.6 g/dL — AB (ref 6.5–8.1)

## 2015-03-29 LAB — PREPARE FRESH FROZEN PLASMA
UNIT DIVISION: 0
Unit division: 0

## 2015-03-29 LAB — CBC WITH DIFFERENTIAL/PLATELET
BASOS ABS: 0 10*3/uL (ref 0.0–0.1)
Basophils Relative: 0 %
Eosinophils Absolute: 0.1 10*3/uL (ref 0.0–0.7)
Eosinophils Relative: 1 %
HEMATOCRIT: 34.1 % — AB (ref 39.0–52.0)
HEMOGLOBIN: 10.8 g/dL — AB (ref 13.0–17.0)
LYMPHS ABS: 1.6 10*3/uL (ref 0.7–4.0)
LYMPHS PCT: 33 %
MCH: 31.4 pg (ref 26.0–34.0)
MCHC: 31.7 g/dL (ref 30.0–36.0)
MCV: 99.1 fL (ref 78.0–100.0)
Monocytes Absolute: 0.4 10*3/uL (ref 0.1–1.0)
Monocytes Relative: 8 %
NEUTROS ABS: 2.9 10*3/uL (ref 1.7–7.7)
NEUTROS PCT: 58 %
Platelets: 162 10*3/uL (ref 150–400)
RBC: 3.44 MIL/uL — AB (ref 4.22–5.81)
RDW: 17.8 % — ABNORMAL HIGH (ref 11.5–15.5)
WBC: 4.9 10*3/uL (ref 4.0–10.5)

## 2015-03-29 LAB — GLUCOSE, CAPILLARY
GLUCOSE-CAPILLARY: 78 mg/dL (ref 65–99)
Glucose-Capillary: 86 mg/dL (ref 65–99)

## 2015-03-29 LAB — RESPIRATORY VIRUS PANEL
ADENOVIRUS: NEGATIVE
INFLUENZA A: NEGATIVE
INFLUENZA B 1: NEGATIVE
Metapneumovirus: NEGATIVE
PARAINFLUENZA 1 A: NEGATIVE
PARAINFLUENZA 3 A: NEGATIVE
Parainfluenza 2: NEGATIVE
RESPIRATORY SYNCYTIAL VIRUS A: NEGATIVE
Respiratory Syncytial Virus B: NEGATIVE
Rhinovirus: NEGATIVE

## 2015-03-29 LAB — CULTURE, RESPIRATORY W GRAM STAIN: Culture: NO GROWTH

## 2015-03-29 LAB — CULTURE, RESPIRATORY

## 2015-03-29 MED ORDER — AMLODIPINE BESYLATE 5 MG PO TABS
5.0000 mg | ORAL_TABLET | Freq: Every day | ORAL | Status: DC
  Administered 2015-03-29 – 2015-03-31 (×3): 5 mg via ORAL
  Filled 2015-03-29 (×3): qty 1

## 2015-03-29 MED ORDER — ALBUTEROL SULFATE (2.5 MG/3ML) 0.083% IN NEBU: 2.5000 mg | INHALATION_SOLUTION | RESPIRATORY_TRACT | Status: DC | PRN

## 2015-03-29 MED ORDER — ASPIRIN 81 MG PO CHEW
81.0000 mg | CHEWABLE_TABLET | Freq: Every day | ORAL | Status: DC
  Administered 2015-03-29 – 2015-03-31 (×3): 81 mg via ORAL
  Filled 2015-03-29 (×3): qty 1

## 2015-03-29 MED ORDER — FAMOTIDINE 20 MG PO TABS
20.0000 mg | ORAL_TABLET | Freq: Two times a day (BID) | ORAL | Status: DC
  Administered 2015-03-29 – 2015-03-31 (×4): 20 mg via ORAL
  Filled 2015-03-29 (×6): qty 1

## 2015-03-29 MED ORDER — MOMETASONE FURO-FORMOTEROL FUM 100-5 MCG/ACT IN AERO
2.0000 | INHALATION_SPRAY | Freq: Two times a day (BID) | RESPIRATORY_TRACT | Status: DC
  Administered 2015-03-29 – 2015-03-31 (×5): 2 via RESPIRATORY_TRACT
  Filled 2015-03-29: qty 8.8

## 2015-03-29 MED ORDER — ONDANSETRON HCL 4 MG PO TABS
4.0000 mg | ORAL_TABLET | Freq: Three times a day (TID) | ORAL | Status: DC | PRN
  Administered 2015-03-29 – 2015-03-31 (×2): 4 mg via ORAL
  Filled 2015-03-29 (×2): qty 1

## 2015-03-29 MED ORDER — IBUPROFEN 200 MG PO TABS: 600.0000 mg | ORAL_TABLET | Freq: Four times a day (QID) | ORAL | Status: DC | PRN

## 2015-03-29 NOTE — Plan of Care (Signed)
Problem: Phase I Progression Outcomes Goal: VTE prophylaxis Outcome: Completed/Met Date Met:  03/28/15 Patient on heparin SQ

## 2015-03-29 NOTE — Progress Notes (Signed)
PULMONARY / CRITICAL CARE MEDICINE   Name: Benjamin Bell MRN: [id number] DOB: 06-13-1955    ADMISSION DATE:  03/26/2015  CHIEF COMPLAINT:  SOB  BRIEF:   60 y/o with COPD, pulmonary nodules and cirrhosis was d/c'd from [facility] on 1/25 after a fall causing multiple rib fractures and left hydropneumothorax was admitted to [facility] on 1/27 with acute respiratory failure with hypoxemia in the setting of HCAP and a large left pleural effusion. He was intubated and a chest tube was placed.    SUBJECTIVE:  Continue to put out > 400cc pleural fluid from left chest tube Extubated yesterday Some pain Ate this morning   VITAL SIGNS: Temp:  [98.2 F (36.8 C)-98.8 F (37.1 C)] 98.2 F (36.8 C) (01/30 0800) Resp:  [9-20] 9 (01/30 0600) BP: (108-169)/(49-88) 136/70 mmHg (01/30 0600) SpO2:  [89 %-98 %] 93 % (01/30 0600) Weight:  [81.1 kg (178 lb 12.7 oz)] 81.1 kg (178 lb 12.7 oz) (01/30 0500) HEMODYNAMICS: CVP:  [6 mmHg-17 mmHg] 17 mmHg VENTILATOR SETTINGS:   INTAKE / OUTPUT:  Intake/Output Summary (Last 24 hours) at 03/29/15 1055 Last data filed at 03/29/15 1036  Gross per 24 hour  Intake   1030 ml  Output   6805 ml  Net  -5775 ml    PHYSICAL EXAMINATION: General: awake in bed HENT: NCAT OP clear PULM: diminished breath sounds bilaterally but normal air movement, chest tube in place left CV: RRR, no mgr GI: BS+, soft, nontender MSK: normal bulk, tone Neuro: A&Ox4, maew  LABS:  CBC  Recent Labs Lab 03/27/15 1805 03/28/15 0424 03/29/15 0522  WBC 8.0 5.2 4.9  HGB 10.1* 9.5* 10.8*  HCT 31.5* 30.2* 34.1*  PLT 207 173 162   Coag's  Recent Labs Lab 03/27/15 0139  APTT 30  INR 1.87*   BMET  Recent Labs Lab 03/27/15 0400 03/28/15 0424 03/29/15 0522  NA 136 138 140  K 4.0 3.8 3.0*  CL 103 105 103  CO2 BUN 21* 33* 35*  CREATININE 1.25* 1.07 0.97  GLUCOSE 190* 86 63*   Electrolytes  Recent Labs Lab 03/26/15 1738 03/27/15 0139 03/27/15 0400  03/28/15 0424 03/29/15 0522  CALCIUM 8.1*  --  7.3* 7.9* 7.5*  MG 1.9 1.9  --   --   --   PHOS 5.9* 3.7  --   --   --    Sepsis Markers  Recent Labs Lab 03/27/15 0400 03/27/15 0850 03/27/15 1215  LATICACIDVEN 4.1* 2.6* 1.6   ABG  Recent Labs Lab 03/27/15 0115 03/27/15 0755 03/28/15 0452  PHART 7.319* 7.372 7.387  PCO2ART 44.7 45.3* 46.2*  PO2ART 128* 126* 91.7   Liver Enzymes  Recent Labs Lab 03/27/15 0139 03/28/15 0424 03/29/15 0522  AST 123* 118* 142*  ALT 60 54 68*  ALKPHOS 169* 155* 151*  BILITOT 0.8 0.6 0.9  ALBUMIN 1.7* 1.9* 1.9*   Cardiac Enzymes  Recent Labs Lab 03/27/15 0139 03/27/15 0400 03/27/15 1030  TROPONINI 2.63* 2.36* 2.52*   Glucose  Recent Labs Lab 03/28/15 0049 03/28/15 0415 03/28/15 0757 03/28/15 1124 03/28/15 1511 03/29/15 0656  GLUCAP 72 78 88 66 100* 86    Imaging 1/30 CXR > images personally reviewed, bilateral hazy opacities, left chest tube in place  Bedside diagnostic thoracentesis: frank blood  ASSESSMENT / PLAN:  60 yo male with acute hypoxemic respiratory failure due to HCAP, baseline emphysema and a left sided traumatic hemothorax (presumed, noted to be frank blood by fellow).  As  of 1/30 doing much better.   PULMONARY A: Emphysema, COPD not in exacerbation>  Needs outpatient PFT Hemothorax, s/p chest tube Pleural effusion, now with continued pleural fluid drainage HCAP Tobacco abuse P:   Maintain chest tube today O2 as needed to maintain O2 saturation > 90% Out of bed Incentive spirometry Pain control > percocet and NSAIDS D/C ARDS protocol/vent orders D/C vent sedation protocol D/C tube feedings Will write chest tube orders: water seal Consider d/c chest tube tomorrow Add Dulera/albuterol prn   CARDIOVASCULAR A:  Shock, resolved Demand ischemia > 1/27 and 1/28 EKG personally reviewed, no ST wave abnormalities High likelihood of baseline CAD given tobacco abuse P:  ASA daily Will need  outpatient cardiology evaluation  RENAL A:  AKI > resolved P:   Monitor BMET and UOP Replace electrolytes as needed D/C lasix Watch renal function carefully with NSAIDS  GASTROINTESTINAL A:  No acute issues P:   Continue famotidine oral given NSAIDS Advance diet to regular diet  HEMATOLOGIC A:  Leukocytosis with chronic anemia (likely 2/2 chronic disease), coagulapthy P:  - trend CBC in am  -no further bleeding  INFECTIOUS A:  PNA, HCAP P:   BCx2 1/27 >  Sputum 1/27 >  pleural 1/28>>> Urine 1/27 > "insignificant growth" Culture neg, narrow, consider 7 days abx  1/27 Vancomycin>>>1/29 1/27 zosyn>>>1/28 1/28 cefepime>>>1/29 1/29 ceftriaxone>>> plan 7 days total, could probably change to oral 1/31  ENDOCRINE A:    No active issues P:   SSI Goal BG <180  NEUROLOGIC A:  Acute encephalopathy resolved Pain  P:   Percocet prn Ibuprofen prn  Move to med surg  Heber Jesup, MD [place] [facility] Pager: [phone] Cell: [phone] After 3pm or if no response, call [phone]

## 2015-03-30 ENCOUNTER — Inpatient Hospital Stay (HOSPITAL_COMMUNITY): Payer: Medicaid Other

## 2015-03-30 ENCOUNTER — Encounter (HOSPITAL_COMMUNITY): Payer: Self-pay

## 2015-03-30 DIAGNOSIS — E876 Hypokalemia: Secondary | ICD-10-CM

## 2015-03-30 DIAGNOSIS — G894 Chronic pain syndrome: Secondary | ICD-10-CM

## 2015-03-30 LAB — BASIC METABOLIC PANEL
Anion gap: 6 (ref 5–15)
BUN: 26 mg/dL — AB (ref 6–20)
CHLORIDE: 101 mmol/L (ref 101–111)
CO2: 33 mmol/L — ABNORMAL HIGH (ref 22–32)
Calcium: 7.5 mg/dL — ABNORMAL LOW (ref 8.9–10.3)
Creatinine, Ser: 0.97 mg/dL (ref 0.61–1.24)
GFR calc non Af Amer: >60 mL/min (ref >60)
Glucose, Bld: 82 mg/dL (ref 65–99)
POTASSIUM: 3 mmol/L — AB (ref 3.5–5.1)
SODIUM: 140 mmol/L (ref 135–145)

## 2015-03-30 LAB — CBC WITH DIFFERENTIAL/PLATELET
Basophils Absolute: 0 10*3/uL (ref 0.0–0.1)
Basophils Relative: 0 %
EOS PCT: 5 %
Eosinophils Absolute: 0.2 10*3/uL (ref 0.0–0.7)
HCT: 36.5 % — ABNORMAL LOW (ref 39.0–52.0)
HEMOGLOBIN: 11.5 g/dL — AB (ref 13.0–17.0)
LYMPHS ABS: 1.2 10*3/uL (ref 0.7–4.0)
LYMPHS PCT: 31 %
MCH: 31.3 pg (ref 26.0–34.0)
MCHC: 31.5 g/dL (ref 30.0–36.0)
MCV: 99.2 fL (ref 78.0–100.0)
Monocytes Absolute: 0.4 10*3/uL (ref 0.1–1.0)
Monocytes Relative: 9 %
NEUTROS PCT: 55 %
Neutro Abs: 2.1 10*3/uL (ref 1.7–7.7)
Platelets: 155 10*3/uL (ref 150–400)
RBC: 3.68 MIL/uL — AB (ref 4.22–5.81)
RDW: 17.6 % — ABNORMAL HIGH (ref 11.5–15.5)
WBC: 3.8 10*3/uL — AB (ref 4.0–10.5)

## 2015-03-30 LAB — LEGIONELLA ANTIGEN, URINE

## 2015-03-30 MED ORDER — AMOXICILLIN-POT CLAVULANATE 875-125 MG PO TABS
1.0000 | ORAL_TABLET | Freq: Two times a day (BID) | ORAL | Status: DC
  Administered 2015-03-30 – 2015-03-31 (×3): 1 via ORAL
  Filled 2015-03-30 (×4): qty 1

## 2015-03-30 MED ORDER — POTASSIUM CHLORIDE CRYS ER 20 MEQ PO TBCR
40.0000 meq | EXTENDED_RELEASE_TABLET | Freq: Once | ORAL | Status: AC
  Administered 2015-03-30: 40 meq via ORAL
  Filled 2015-03-30: qty 2

## 2015-03-30 MED ORDER — METHADONE HCL 10 MG PO TABS
10.0000 mg | ORAL_TABLET | Freq: Three times a day (TID) | ORAL | Status: DC
  Administered 2015-03-30 – 2015-03-31 (×3): 10 mg via ORAL
  Filled 2015-03-30 (×3): qty 1

## 2015-03-30 MED ORDER — DOCUSATE SODIUM 100 MG PO CAPS
100.0000 mg | ORAL_CAPSULE | Freq: Two times a day (BID) | ORAL | Status: DC
  Administered 2015-03-30 – 2015-03-31 (×3): 100 mg via ORAL
  Filled 2015-03-30 (×3): qty 1

## 2015-03-30 NOTE — Progress Notes (Signed)
Initial Nutrition Assessment  DOCUMENTATION CODES:   Not applicable  INTERVENTION:  - RD will continue to monitor for needs  NUTRITION DIAGNOSIS:   Inadequate oral intake related to inability to eat, dysphagia as evidenced by NPO status. -improved with diet advancement, will continue to monitor intakes prior to determining other nutrition-related dx  GOAL:   Patient will meet greater than or equal to 90% of their needs -met currently  MONITOR:   Diet advancement, Labs, Weight trends, Skin, I & O's  ASSESSMENT:   60yo M with MMP including AAA, pulmonary nodules, hepatic cirrhosis recently d/ced from [facility] on 03/24/2015 after a mechanical fall, multiple left rib fractures with left traumatic hydropneumothorax intubated development of right UL pneumonia and Left sided pleural effusion. He was discharged and states that since that time he has been severely SOB with cough and subjective fevers and chills.  1/31 Pt advanced to Regular diet following CLD after extubation. He consumed 100% of breakfast this AM without issue. Nutrition needs adjusted s/p extubation and based on PMH of cirrhosis. Pt meeting needs at this time but will continue to monitor intakes, tolerance of diet texture, and associated needs. Medications reviewed. Labs reviewed; K: 3 mmol/L, BUN: 26 mmol/L, Ca: 8.5 mg/dL, LFTs elevated.    1/29 - RD was consulted to adjust TF orders but pt was extubated this AM. - If patient is to remain extubated, will monitor for diet advancement.  - RD provided TF recommendations if pt is unable to have diet advanced. - Per previous admission notes (1/11-1/23), pt was placed on dysphagia 3 diet with nectar thick liquids with good tolerance.  - Will monitor for findings from swallow eval. - Nutrition focused physical exam shows no sign of depletion of muscle mass or body fat.  Diet Order:  Diet regular Room service appropriate?: Yes; Fluid consistency:: Thin  Skin:  Wound (see  comment) (Stage II scrotum and stage I perineal ulcer)  Last BM:  1/25  Height:   Ht Readings from Last 1 Encounters:  03/30/15 _0  (1.803 m)    Weight:   Wt Readings from Last 1 Encounters:  03/30/15 176 lb 4.8 oz (79.969 kg)    Ideal Body Weight:  109 kg  BMI:  Body mass index is 24.6 kg/(m^2).  Estimated Nutritional Needs:   Kcal:  2000-2200  Protein:  80-95 grams (1-1.2 grams/kg)  Fluid:  >/= 1.7 L/day  EDUCATION NEEDS:   No education needs identified at this time     Jarome Matin, RD, LDN Inpatient Clinical Dietitian Pager # [phone] After hours/weekend pager # [phone]

## 2015-03-30 NOTE — Progress Notes (Signed)
PULMONARY / CRITICAL CARE MEDICINE   Name: Benjamin Bell MRN: [id number] DOB: 03-Aug-1955    ADMISSION DATE:  03/26/2015  CHIEF COMPLAINT:  SOB  BRIEF:   60 y/o with COPD, pulmonary nodules and cirrhosis was d/c'd from [facility] on 1/25 after a fall causing multiple rib fractures and left hydropneumothorax was admitted to [facility] on 1/27 with acute respiratory failure with hypoxemia in the setting of HCAP and a large left pleural effusion. He was intubated and a chest tube was placed.    SUBJECTIVE:  Chest tube output down No acute events Complaining of pain Having bowel movement this morning   VITAL SIGNS: Temp:  [97.5 F (36.4 C)-98.3 F (36.8 C)] 98.3 F (36.8 C) (01/31 0505) Pulse Rate:  [84-145] 84 (01/31 0505) Resp:  [16-22] 20 (01/31 0505) BP: (108-133)/(71-73) 132/71 mmHg (01/31 0505) SpO2:  [90 %-94 %] 94 % (01/31 0505) Weight:  [176 lb 4.8 oz (79.969 kg)] 176 lb 4.8 oz (79.969 kg) (01/31 0500) HEMODYNAMICS:   VENTILATOR SETTINGS:   INTAKE / OUTPUT:  Intake/Output Summary (Last 24 hours) at 03/30/15 1200 Last data filed at 03/30/15 0900  Gross per 24 hour  Intake    120 ml  Output   1790 ml  Net  -1670 ml    PHYSICAL EXAMINATION: General: up in chair HEENT: NCAT, EOMi PULM: normal respiratory effort MSK: no gross deformities Neuro: awake, alert, non-focal  LABS:  CBC  Recent Labs Lab 03/28/15 0424 03/29/15 0522 03/30/15 0606  WBC 5.2 4.9 3.8*  HGB 9.5* 10.8* 11.5*  HCT 30.2* 34.1* 36.5*  PLT 173 162 155   Coag's  Recent Labs Lab 03/27/15 0139  APTT 30  INR 1.87*   BMET  Recent Labs Lab 03/28/15 0424 03/29/15 0522 03/30/15 0606  NA 138 140 140  K 3.8 3.0* 3.0*  CL 105 103 101  CO2 27 31 33*  BUN 33* 35* 26*  CREATININE 1.07 0.97 0.97  GLUCOSE 86 63* 82   Electrolytes  Recent Labs Lab 03/26/15 1738 03/27/15 0139  03/28/15 0424 03/29/15 0522 03/30/15 0606  CALCIUM 8.1*  --   < > 7.9* 7.5* 7.5*  MG 1.9 1.9  --   --   --   --    PHOS 5.9* 3.7  --   --   --   --   < > = values in this interval not displayed. Sepsis Markers  Recent Labs Lab 03/27/15 0400 03/27/15 0850 03/27/15 1215  LATICACIDVEN 4.1* 2.6* 1.6   ABG  Recent Labs Lab 03/27/15 0115 03/27/15 0755 03/28/15 0452  PHART 7.319* 7.372 7.387  PCO2ART 44.7 45.3* 46.2*  PO2ART 128* 126* 91.7   Liver Enzymes  Recent Labs Lab 03/27/15 0139 03/28/15 0424 03/29/15 0522  AST 123* 118* 142*  ALT 60 54 68*  ALKPHOS 169* 155* 151*  BILITOT 0.8 0.6 0.9  ALBUMIN 1.7* 1.9* 1.9*   Cardiac Enzymes  Recent Labs Lab 03/27/15 0139 03/27/15 0400 03/27/15 1030  TROPONINI 2.63* 2.36* 2.52*   Glucose  Recent Labs Lab 03/28/15 0049 03/28/15 0415 03/28/15 0757 03/28/15 1124 03/28/15 1511 03/29/15 0656  GLUCAP 72 78 88 66 100* 86    Imaging 1/30 CXR > images personally reviewed, bilateral hazy opacities, left chest tube in place  Bedside diagnostic thoracentesis: frank blood  ASSESSMENT / PLAN:  60 yo male with acute hypoxemic respiratory failure due to HCAP, baseline emphysema and a left sided traumatic hemothorax (presumed, noted to be frank blood by fellow).  As of  1/31 doing much better.   PULMONARY A: Emphysema, COPD not in exacerbation>  Needs outpatient PFT Hemothorax, s/p chest tube > resolved Pleural effusion, resolved HCAP Tobacco abuse P:   Remove chest tube today, CXR afterwards O2 as needed to maintain O2 saturation > 90% Out of bed Incentive spirometry Pain control > percocet and NSAIDS Continue Dulera/albuterol prn Home O2 evaluation screen   CARDIOVASCULAR A:  Demand ischemia > 1/27 and 1/28 EKG personally reviewed, no ST wave abnormalities High likelihood of baseline CAD given tobacco abuse P:  ASA daily Will need outpatient cardiology evaluation  RENAL A:  AKI > resolved Hypokalemia P:   Monitor BMET and UOP Replace electrolytes as needed Replete K today Watch renal function carefully with  NSAIDS  GASTROINTESTINAL A:  No acute issues P:   Continue famotidine oral given NSAIDS Advance diet to regular diet  HEMATOLOGIC A:  Leukocytosis with chronic anemia (likely 2/2 chronic disease), coagulapthy P:  Monitor for bleeding  INFECTIOUS A:  PNA, HCAP P:   BCx2 1/27 >  Sputum 1/27 >  pleural 1/28>>> Urine 1/27 > "insignificant growth" Culture neg, narrow, consider 7 days abx  1/27 Vancomycin>>>1/29 1/27 zosyn>>>1/28 1/28 cefepime>>>1/29 1/29 ceftriaxone>>> plan 7 days total, change to augmentin today  ENDOCRINE A:    No active issues P:   SSI Goal BG <180  NEUROLOGIC A:  Acute encephalopathy resolved Pain, chronic Fall deconditioning P:   Percocet prn Ibuprofen prn Add back methadone PT recommends home PT   Likely d/c home tomorrow  Heber Kiowa, MD [place] [facility] Pager: [phone] Cell: [phone] After 3pm or if no response, call [phone]

## 2015-03-30 NOTE — Progress Notes (Signed)
Pt with elevated HR sustained 145; other vitals 98.1, 108/73, 22, 93% on RA.  On call MD paged; new orders given to place on cardiac monitor and obtain EKG.

## 2015-03-30 NOTE — Care Management Note (Signed)
Case Management Note  Patient Details  Name: Benjamin Bell MRN: [id number] Date of Birth: 07-16-1955  Subjective/Objective:                  acute respiratory failure  Action/Plan: Discharge planning Expected Discharge Date:   (unknown)               Expected Discharge Plan:  [place]  In-House Referral:     Discharge planning Services  CM Consult  Post Acute Care Choice:  Home Health Choice offered to:  Patient  DME Arranged:    DME Agency:     HH Arranged:  Patient Refused [place] Agency:     Status of Service:  In process, will continue to follow  Medicare Important Message Given:    Date Medicare IM Given:    Medicare IM give by:    Date Additional Medicare IM Given:    Additional Medicare Important Message give by:     If discussed at [place] of Stay Meetings, dates discussed:    Additional Comments: CM met with pt to offer choice of home health agency.  Pt has used [facility] in the past but pt adamantly refuses all [place] services at this time.  Pt states it's not the agency, it's his dog and it makes for chaos when he has visitors.  CM will follow in case pt changes mind prior to discharge. Dellie Catholic, RN 03/30/2015, 4:06 PM

## 2015-03-30 NOTE — Progress Notes (Signed)
Taking over care of patient, agree with previous RN assessment. Pt resting comfortably at this time, will continue to monitor.  

## 2015-03-30 NOTE — Evaluation (Signed)
Physical Therapy Evaluation Patient Details Name: Benjamin Bell MRN: [id number] DOB: 10-03-1955 Today's Date: 03/30/2015   History of Present Illness  60 y/o with COPD, pulmonary nodules and cirrhosis was d/c'd from [facility] on 1/25 after a fall causing multiple rib fractures and left hydropneumothorax was admitted to [facility] on 1/27 with acute respiratory failure with hypoxemia in the setting of HCAP and a large left pleural effusion. He was intubated and a chest tube was placed.   Clinical Impression  Pt admitted with above diagnosis. Pt currently with functional limitations due to the deficits listed below (see PT Problem List). Pt will benefit from skilled PT to increase their independence and safety with mobility to allow discharge to the venue listed below.  Pt moving slightly guarded with gait, due to chest tube and recent rib fx pain, but able to ambulate in hallway with RW with min/guard. Recommend HHPT.     Follow Up Recommendations Home health PT    Equipment Recommendations  None recommended by PT    Recommendations for Other Services       Precautions / Restrictions Precautions Precautions: Fall Precaution Comments: chest tube in place at time of PT eval Restrictions Weight Bearing Restrictions: No      Mobility  Bed Mobility Overal bed mobility: Modified Independent Bed Mobility: Supine to Sit           General bed mobility comments: use of rail for A to EOB  Transfers Overall transfer level: Needs assistance Equipment used: Rolling walker (2 wheeled) Transfers: Sit to/from Stand Sit to Stand: Min guard         General transfer comment: poor use of hands and not following directions for proper hand placement  Ambulation/Gait Ambulation/Gait assistance: Min guard Ambulation Distance (Feet): 50 Feet Assistive device: Rolling walker (2 wheeled) Gait Pattern/deviations: Step-through pattern;Trunk flexed Gait velocity: decreased   General Gait Details: Slow  guarded gait with o2, foley, chest tube in place.  Difficulty with turning, but min/guard  Stairs            Wheelchair Mobility    Modified Rankin (Stroke Patients Only)       Balance Overall balance assessment: Needs assistance Sitting-balance support: Feet supported Sitting balance-Leahy Scale: Good       Standing balance-Leahy Scale: Poor Standing balance comment: requires RW for support                             Pertinent Vitals/Pain Pain Assessment: 0-10 Pain Score: 6  Pain Location: L chest tube insertion site Pain Intervention(s): Monitored during session;Premedicated before session    Home Living Family/patient expects to be discharged to:: Private residence Living Arrangements: Spouse/significant other Available Help at Discharge: Family;Available 24 hours/day Type of Home: Apartment Home Access: Stairs to enter Entrance Stairs-Rails: None Entrance Stairs-Number of Steps: 2 Home Layout: One level Home Equipment: Walker - 2 wheels;Bedside commode      Prior Function Level of Independence: Independent               Hand Dominance   Dominant Hand: Right    Extremity/Trunk Assessment   Upper Extremity Assessment: Overall WFL for tasks assessed           Lower Extremity Assessment: Generalized weakness      Cervical / Trunk Assessment: Other exceptions  Communication      Cognition Arousal/Alertness: Awake/alert Behavior During Therapy: WFL for tasks assessed/performed Overall Cognitive Status: Within Functional Limits for  tasks assessed                      General Comments General comments (skin integrity, edema, etc.): When returned from gait, pt noted gown on L side to be saturated.  Chest tube appeared to be intact. Pt with no change in pain and no gait belt had been used.  Nurse in to assess.    Exercises        Assessment/Plan    PT Assessment Patient needs continued PT services  PT Diagnosis  Difficulty walking;Generalized weakness   PT Problem List Decreased strength;Decreased activity tolerance;Decreased balance;Decreased mobility;Decreased knowledge of use of DME;Cardiopulmonary status limiting activity  PT Treatment Interventions DME instruction;Gait training;Functional mobility training;Therapeutic activities;Therapeutic exercise;Balance training;Neuromuscular re-education;Patient/family education   PT Goals (Current goals can be found in the Care Plan section) Acute Rehab PT Goals Patient Stated Goal: go home PT Goal Formulation: With patient Time For Goal Achievement: 04/13/15 Potential to Achieve Goals: Good    Frequency Min 3X/week   Barriers to discharge        Co-evaluation               End of Session Equipment Utilized During Treatment: Oxygen Activity Tolerance: Patient tolerated treatment well Patient left: in chair;with chair alarm set;with nursing/sitter in room Nurse Communication: Mobility status;Other (comment) (gown saturated with fluid)         Time: 1610-9604 PT Time Calculation (min) (ACUTE ONLY): 29 min   Charges:   PT Evaluation $PT Eval Moderate Complexity: 1 Procedure PT Treatments $Gait Training: 8-22 mins   PT G Codes:        Hurley Blevins LUBECK 03/30/2015, 11:44 AM

## 2015-03-31 ENCOUNTER — Inpatient Hospital Stay (HOSPITAL_COMMUNITY): Payer: Medicaid Other

## 2015-03-31 LAB — CULTURE, BLOOD (ROUTINE X 2)
Culture: NO GROWTH
Culture: NO GROWTH

## 2015-03-31 LAB — BASIC METABOLIC PANEL
ANION GAP: 6 (ref 5–15)
BUN: 19 mg/dL (ref 6–20)
CO2: 30 mmol/L (ref 22–32)
Calcium: 7.5 mg/dL — ABNORMAL LOW (ref 8.9–10.3)
Chloride: 101 mmol/L (ref 101–111)
Creatinine, Ser: 0.8 mg/dL (ref 0.61–1.24)
GFR calc non Af Amer: >60 mL/min (ref >60)
GLUCOSE: 85 mg/dL (ref 65–99)
POTASSIUM: 3.6 mmol/L (ref 3.5–5.1)
SODIUM: 137 mmol/L (ref 135–145)

## 2015-03-31 LAB — BODY FLUID CULTURE: CULTURE: NO GROWTH

## 2015-03-31 MED ORDER — AMOXICILLIN-POT CLAVULANATE 875-125 MG PO TABS: 1.0000 | ORAL_TABLET | Freq: Two times a day (BID) | ORAL | Status: DC

## 2015-03-31 MED ORDER — ASPIRIN 81 MG PO CHEW: 81.0000 mg | CHEWABLE_TABLET | Freq: Every day | ORAL | Status: DC

## 2015-03-31 MED ORDER — IBUPROFEN 600 MG PO TABS: 600.0000 mg | ORAL_TABLET | Freq: Four times a day (QID) | ORAL | Status: DC | PRN

## 2015-03-31 MED ORDER — MOMETASONE FURO-FORMOTEROL FUM 100-5 MCG/ACT IN AERO: 2.0000 | INHALATION_SPRAY | Freq: Two times a day (BID) | RESPIRATORY_TRACT | Status: DC

## 2015-03-31 NOTE — Progress Notes (Signed)
SATURATION QUALIFICATIONS: (This note is used to comply with regulatory documentation for home oxygen)  Patient Saturations on Room Air at Rest = 89%  Patient Saturations on Room Air while Ambulating = 84%%  Patient Saturations on 2Liters of oxygen while Ambulating = 94%  Please briefly explain why patient needs home oxygen: 

## 2015-03-31 NOTE — Progress Notes (Signed)
Discharge instructions given to pt/spouse, verbalized understanding. Left the unit in stable condition.  

## 2015-03-31 NOTE — Progress Notes (Signed)
CM received call from RN requesting home O2 for pt.  RN has placed pulmonary saturation  Note and order has been placed.  Cm called [facility] DME rep, Lecretia to please arrange for home O2 tank to be delivered to room prior to discharge.  No other CM needs were communicated.

## 2015-03-31 NOTE — Progress Notes (Signed)
O2 Sat on room air at Rest = 89% O2 Sat on room air while ambulating =84% O2 sat on 2l/[place]  =94%.

## 2015-03-31 NOTE — Progress Notes (Signed)
[place] pulmonary critical care  Mr. Mcmurtry is feeling well, he slept well. No breathing complaints He is still using oxygen On exam his lungs are clear to auscultation bilaterally, few wheezes in bases only, normal effort, normal cardiac exam, bowel sounds are positive nontender nondistended  Pneumothorax after chest tube removal is small, we will repeat chest x-ray today and if that is okay then he can be discharged home acute respiratory failure with hypoxemia secondary to pneumonia as well as underlying COPD, will need outpatient PFT and follow-up with pulmonary medicine, likely needs home O2 for a few weeks Deconditioning: Will need outpatient physical therapy  Heber Tolley, MD [place] [facility] Pager: [phone] Cell: [phone] After 3pm or if no response, call [phone]

## 2015-03-31 NOTE — Discharge Instructions (Signed)
No lifting more than 5 lbs You may shower Friday: allow water and soap over chest tube site but don't scrub May dress it with regular dressing   Call [phone] IF you have any concerns

## 2015-03-31 NOTE — Discharge Summary (Signed)
Physician Discharge Summary       Patient ID: Benjamin Bell MRN: [id number] DOB/AGE: [birth year] 60 y.o.  Admit date: 03/26/2015 Discharge date: 03/31/2015  Discharge Diagnoses:   Emphysema, COPD not in exacerbation Hemothorax, s/p chest tube > resolved Pleural effusion Small left apical PTX  HCAP Tobacco abuse  Demand ischemia  AKI  Hypokalemia Leukocytosis  chronic anemia  HCAP Acute encephalopathy Pain, chronic S/p Fall Physical deconditioning Detailed Hospital Course:  60yo M with MMP including AAA, pulmonary nodules, hepatic cirrhosis recently d/c'd from [facility] on 03/24/2015 after a mechanical fall, multiple left rib fractures with left traumatic hydropneumothorax intubated development of right UL pneumonia and Left sided pleural effusion. He was discharged and stated that since that time he had been severely SOB with cough and subjective fevers and chills. On 1/28 EMS was called by his wife and he was found unresponsive. He awoke with a dose of narcan but upon arrival to the ED he became progressively hypoxemic requiring Bipap eventually intubated and left CT was placed for drainage of hemothrax. He was admitted to the intensive care. Further supportive care included: obtaining cultures, empiric antibiotics, and pain management, as well as IV hydration and vaso-active gtt support w/ pressors. His course was complicated by: AKI, elevated Trop I representing demand ischemia & encephalopathy. These all improved w/ supportive care. His CT out-put was monitored & the tube was removed on 1/31. Post-removal CXR did show small apical PTX, He was monitored t/o the evening. On 2/1 he had a f/u CXR this showed small apical PTX on left which had not changed significantly. He did have small left effusion and what was felt to be lg to mod subpleural hematoma but he was otherwise deemed ready for d/c with the plan as outlined below. Prior to d/c his O2 sats were observed w/ ambulation to determine  his home O2 needs. His resting O2 sats were:89%, they dropped to  84% at the lowest point during ambulation and he was then placed on 2 liters of Oxygen.   Discharge Plan by active problems   Emphysema, COPD not in exacerbation Hemothorax, s/p chest tube > resolved Small apical PTX Pleural effusion HCAP Tobacco abuse Plan F/U our office w/ PA/Lat CXR, need to evaluate pleural fluid AND ensure PTX has resolved Also to have CT sutures removed.  O2 as needed to maintain O2 saturation > 90% Continue Dulera/albuterol prn Needs Out-pt PFTs (ordered for April 13th) Pain control > percocet and NSAIDS Eventually f/u w/ [facility] a few days after PFTs  Demand ischemia > 1/27 and 1/28 EKG personally reviewed, no ST wave abnormalities High likelihood of baseline CAD given tobacco abuse P:  ASA daily Will need outpatient cardiology evaluation: schedule w/ Hochrein   PNA/HCAP (NOS) Plan:  Complete 5 more days of Augmentin (this will be a total of 7 days)  Pain, chronic deconditioning Plan:  Percocet prn Ibuprofen prn Add back methadone PT recommends home PT -->ordered    Significant Hospital tests/ studies  Consults   Discharge Exam: BP 141/77 mmHg  Pulse 92  Temp(Src) 97.7 F (36.5 C) (Oral)  Resp 17  Ht 5\' 11"  (1.803 m)  Wt 174 lb 8 oz (79.153 kg)  BMI 24.35 kg/m2  SpO2 94%  General: up in chair HEENT: NCAT, EOMi PULM: normal respiratory effort MSK: no gross deformities Neuro: awake, alert, non-focal  Labs at discharge Lab Results  Component Value Date   CREATININE 0.80 03/31/2015   BUN 19 03/31/2015   NA 137 03/31/2015  K 3.6 03/31/2015   CL 101 03/31/2015   CO2 30 03/31/2015   Lab Results  Component Value Date   WBC 3.8* 03/30/2015   HGB 11.5* 03/30/2015   HCT 36.5* 03/30/2015   MCV 99.2 03/30/2015   PLT 155 03/30/2015   Lab Results  Component Value Date   ALT 68* 03/29/2015   AST 142* 03/29/2015   ALKPHOS 151* 03/29/2015   BILITOT 0.9  03/29/2015   Lab Results  Component Value Date   INR 1.87* 03/27/2015   INR 1.45 03/08/2015    Current radiology studies Dg Chest 2 View  03/31/2015  CLINICAL DATA:  60 year old male status post removal of chest tube EXAM: CHEST  2 VIEW COMPARISON:  Prior chest x-ray 03/30/2015 FINDINGS: Perhaps slightly increased left apical pneumothorax. The pneumothorax remains very small, less than 5%. Small left pleural effusion and associated left basilar atelectasis. Multiple displaced left-sided rib fractures. Suspect posterolateral subpleural hematoma. Stable cardiac and mediastinal contours. Background pulmonary parenchymal changes including chronic bronchial wall thickening and mild interstitial prominence. IMPRESSION: Similar to incrementally increased but still very small (less than 5%) left apical pneumothorax. Suspect moderate to large left posterolateral subpleural hematoma. Small left pleural effusion and associated atelectasis. Electronically Signed   By: Malachy Moan M.D.   On: 03/31/2015 10:49   Dg Chest Port 1 View  03/30/2015  CLINICAL DATA:  Post chest tube removal EXAM: PORTABLE CHEST 1 VIEW COMPARISON:  03/29/2015 FINDINGS: Cardiomediastinal silhouette is stable. Streaky residual mild bilateral airspace disease with slight improvement in aeration. Left chest tube has been removed. Right IJ central line has been removed. There is a tiny left apical pneumothorax. Small subcutaneous emphysema left axilla. IMPRESSION: Persistent streaky residual airspace disease bilaterally with slight improved aeration. Left chest tube has been removed. Right IJ central has been removed. Tiny left apical pneumothorax. Electronically Signed   By: Natasha Mead M.D.   On: 03/30/2015 13:09    Disposition:  01-Home or Self Care     Medication List    STOP taking these medications        naproxen 500 MG tablet  Commonly known as:  NAPROSYN      TAKE these medications        albuterol 108 (90 Base)  MCG/ACT inhaler  Commonly known as:  PROVENTIL HFA;VENTOLIN HFA  Inhale 1-2 puffs into the lungs every 4 (four) hours as needed for wheezing or shortness of breath.     amLODipine 10 MG tablet  Commonly known as:  NORVASC  Take 10 mg by mouth daily.     amoxicillin-clavulanate 875-125 MG tablet  Commonly known as:  AUGMENTIN  Take 1 tablet by mouth 2 (two) times daily.     aspirin 81 MG chewable tablet  Chew 1 tablet (81 mg total) by mouth daily.     ibuprofen 600 MG tablet  Commonly known as:  ADVIL,MOTRIN  Take 1 tablet (600 mg total) by mouth every 6 (six) hours as needed for moderate pain.     lisinopril-hydrochlorothiazide 20-25 MG tablet  Commonly known as:  PRINZIDE,ZESTORETIC  Take 1 tablet by mouth daily.     methadone 10 MG/5ML solution  Commonly known as:  DOLOPHINE  Take 35.5 mLs (71 mg total) by mouth daily.     mometasone-formoterol 100-5 MCG/ACT Aero  Commonly known as:  DULERA  Inhale 2 puffs into the lungs 2 (two) times daily.     oxyCODONE-acetaminophen 7.5-325 MG tablet  Commonly known as:  PERCOCET  Take  1-2 tablets by mouth every 4 (four) hours as needed.     potassium chloride 20 MEQ packet  Commonly known as:  KLOR-CON  Take 20 mEq by mouth daily.       Follow-up Information    Follow up with Bevelyn Ngo, NP On 04/07/2015.   Specialty:  Pulmonary Disease   Why:  report at 1020 for check-in then Chest X ray, followed by your appointment at 11am   Contact information:   [address] [place] [address] [phone]       Follow up with Rollene Rotunda, MD On 04/23/2015.   Specialty:  Cardiology   Why:  at 1030 am    Contact information:   [address] [place] [address] [phone]       Follow up with Max Fickle, MD On 06/14/2015.   Specialty:  Pulmonary Disease   Why:  415 pm    Contact information:   [address] [place] [address] [phone]       Follow up with [facility] On  06/10/2015.   Why:  report for PFTs at 4pm       Discharged Condition: good  Physician Statement:   The Patient was personally examined, the discharge assessment and plan has been personally reviewed and I agree with ACNP Annelyse Rey's assessment and plan. > 30 minutes of time have been dedicated to discharge assessment, planning and discharge instructions.   Signed: Shelby Mattocks 03/31/2015, 11:24 AM

## 2015-04-07 ENCOUNTER — Inpatient Hospital Stay: Payer: Medicaid Other | Admitting: Acute Care

## 2015-04-13 ENCOUNTER — Ambulatory Visit (INDEPENDENT_AMBULATORY_CARE_PROVIDER_SITE_OTHER)
Admission: RE | Admit: 2015-04-13 | Discharge: 2015-04-13 | Disposition: A | Discharge status: Home / Self Care | Payer: Medicaid Other | Source: Ambulatory Visit | Attending: Adult Health | Admitting: Adult Health

## 2015-04-13 ENCOUNTER — Ambulatory Visit (INDEPENDENT_AMBULATORY_CARE_PROVIDER_SITE_OTHER): Payer: Medicaid Other | Admitting: Adult Health

## 2015-04-13 ENCOUNTER — Encounter: Payer: Self-pay | Admitting: Adult Health

## 2015-04-13 VITALS — BP 102/66 | HR 106 | Temp 98.4°F | Ht 71.0 in | Wt 171.0 lb

## 2015-04-13 DIAGNOSIS — J9611 Chronic respiratory failure with hypoxia: Secondary | ICD-10-CM | POA: Diagnosis not present

## 2015-04-13 DIAGNOSIS — J189 Pneumonia, unspecified organism: Secondary | ICD-10-CM

## 2015-04-13 DIAGNOSIS — J9690 Respiratory failure, unspecified, unspecified whether with hypoxia or hypercapnia: Secondary | ICD-10-CM | POA: Insufficient documentation

## 2015-04-13 DIAGNOSIS — J90 Pleural effusion, not elsewhere classified: Secondary | ICD-10-CM | POA: Diagnosis not present

## 2015-04-13 NOTE — Addendum Note (Signed)
Addended by: Karalee Height on: 04/13/2015 04:12 PM   Modules accepted: Orders

## 2015-04-13 NOTE — Assessment & Plan Note (Signed)
Appears clinically better, no further abx at this time  Check cxr on return after thoracentesis .

## 2015-04-13 NOTE — Progress Notes (Signed)
Subjective:    Patient ID: Benjamin Bell, male    DOB: Nov 24, 1955, 60 y.o.   MRN: [id number]  HPI 60 year old male smoker with COPD seen initial pulmonary consult during hospitalization for hemothorax . Small left apical pneumothorax. January 2017 Patient has chronic back pain and ETOH cirrhosis.(no etoh x 2012 ) .  Previous Hep C s/p treatment .  Previous polysubstance abuse with cocaine and Heroin .   04/13/2015 Post Hospital follow up  Returns for a post hospital follow-up. Patient was admitted in January 2017 after a severe fall with multiple left rib fractures and a left-sided traumatic hydropneumothorax. After discharge. Patient had significant shortness of breath and was readmitted to the hospital. He was initially treated with BiPAP support, but eventually had to be intubated. Chest tube was placed for drainage for hemothorax. He was tx w/ abx . Had small  apical PTX after CT was removed.  Since discharge he feeling much better. Says he is able to go walking now .  Pain in ribs is some better.  Has cut back from smoking. Has tried nicotine patches and wellbutrin .  On O2 2l/m with activity and At bedtime  . This was started in January after first admission.  On Dulera Twice daily   CXR today shows a increased Left pleural effusion -moderate size.  He denies any hemoptysis, orthopnea, PND, or fever. Has chronic leg swelling.    Past Medical History  Diagnosis Date  . Chronic back pain   . Hypertension   . COPD (chronic obstructive pulmonary disease) (HCC)   . ARF (acute renal failure) (HCC)     secondary to PNA  . AAA (abdominal aortic aneurysm) [facility])    Current Outpatient Prescriptions on File Prior to Visit  Medication Sig Dispense Refill  . albuterol (PROVENTIL HFA;VENTOLIN HFA) 108 (90 Base) MCG/ACT inhaler Inhale 1-2 puffs into the lungs every 4 (four) hours as needed for wheezing or shortness of breath.    Marland Kitchen amLODipine (NORVASC) 10 MG tablet Take 10 mg by mouth daily.     Marland Kitchen aspirin 81 MG chewable tablet Chew 1 tablet (81 mg total) by mouth daily.    Marland Kitchen ibuprofen (ADVIL,MOTRIN) 600 MG tablet Take 1 tablet (600 mg total) by mouth every 6 (six) hours as needed for moderate pain. 30 tablet 0  . lisinopril-hydrochlorothiazide (PRINZIDE,ZESTORETIC) 20-25 MG tablet Take 1 tablet by mouth daily.    . methadone (DOLOPHINE) 10 MG/5ML solution Take 35.5 mLs (71 mg total) by mouth daily. 120 mL 0  . mometasone-formoterol (DULERA) 100-5 MCG/ACT AERO Inhale 2 puffs into the lungs 2 (two) times daily. 1 Inhaler 6  . potassium chloride (KLOR-CON) 20 MEQ packet Take 20 mEq by mouth daily.    Marland Kitchen oxyCODONE-acetaminophen (PERCOCET) 7.5-325 MG tablet Take 1-2 tablets by mouth every 4 (four) hours as needed. (Patient not taking: Reported on 04/13/2015) 60 tablet 0   No current facility-administered medications on file prior to visit.     Review of Systems Constitutional:   No  weight loss, night sweats,  Fevers, chills,  +fatigue, or  lassitude.  HEENT:   No headaches,  Difficulty swallowing,  Tooth/dental problems, or  Sore throat,                No sneezing, itching, ear ache, nasal congestion, post nasal drip,   CV:  No chest pain,  Orthopnea, PND , anasarca, dizziness, palpitations, syncope.   GI  No heartburn, indigestion, abdominal pain, nausea, vomiting, diarrhea, change in  bowel habits, loss of appetite, bloody stools.   Resp:    No chest wall deformity  Skin: no rash or lesions.  GU: no dysuria, change in color of urine, no urgency or frequency.  No flank pain, no hematuria   MS:  No joint pain or swelling.  No decreased range of motion.  No back pain.  Psych:  No change in mood or affect. No depression or anxiety.  No memory loss.         Objective:   Physical Exam Filed Vitals:   04/13/15 1457  BP: 102/66  Pulse: 106  Temp: 98.4 F (36.9 C)  TempSrc: Oral  Height:  (1.803 m)  Weight: 171 lb (77.565 kg)  SpO2: 92%    GEN: A/Ox3; pleasant ,  NAD frail appearing   HEENT:  [place]/AT,  EACs-clear, TMs-wnl, NOSE-clear, THROAT-clear, no lesions, no postnasal drip or exudate noted.   NECK:  Supple w/ fair ROM; no JVD; normal carotid impulses w/o bruits; no thyromegaly or nodules palpated; no lymphadenopathy.  RESP  Decreased BS on left , no accessory muscle use, no dullness to percussion  CARD:  RRR, no m/r/g  , 1+  peripheral edema, pulses intact, no cyanosis or clubbing.  GI:   Soft & nt; nml bowel sounds; no organomegaly or masses detected.  Musco: Warm bil, no deformities or joint swelling noted.   Neuro: alert, no focal deficits noted.    Skin: Warm, no lesions or rashes Along left lateral chest wall with sutures in place with well approximated area, clean and dry  Mild irriation along tape site.          Assessment & Plan:

## 2015-04-13 NOTE — Patient Instructions (Addendum)
We are setting you up for a thoracentesis on left side for pleural effusion .  Work on not smoking .  Continue on oxygen.  Wash area with soap and water gently , call if signs of redness or drainage , fever.  Follow up with Dr. Kendrick Fries in 3-4 weeks with chest xray and PFT.  Please contact office for sooner follow up if symptoms do not improve or worsen or seek emergency care

## 2015-04-13 NOTE — Assessment & Plan Note (Signed)
Cont on o2 .  

## 2015-04-13 NOTE — Assessment & Plan Note (Signed)
Left Moderate pleural effuison -this has increased since discharge Pt had a traumatic Hydropneumothoroax in early January after fall with multiple rib fractures that eventually required  Chest tube to drain hemothorax.  Apical PTX resolved on today cxr.  Will set up for thoracentesis on left , send fluid for analysis . Do not drain more than 1.5L .   Plan  We are setting you up for a thoracentesis on left side for pleural effusion .  Work on not smoking .  Continue on oxygen.  Wash area with soap and water gently , call if signs of redness or drainage , fever.  Follow up with Dr. Kendrick Fries in 3-4 weeks with chest xray and PFT.  Please contact office for sooner follow up if symptoms do not improve or worsen or seek emergency care

## 2015-04-14 ENCOUNTER — Telehealth: Payer: Self-pay | Admitting: Adult Health

## 2015-04-14 NOTE — Progress Notes (Signed)
Case discussed with Tammy.. X-rays reviewed showing a moderate sized pleural effusion which is increased in size since the chest tube was removed prior to discharge. Likely this is an exudative effusion as when he was hospitalized the last several days of drainage showed serous to serosanguineous drainage and no hemothorax. We will plan for a repeat thoracentesis. If it recurs after that then he will need about a month of prednisone.

## 2015-04-14 NOTE — Telephone Encounter (Signed)
Spoke with pt's wife. She is aware of where pt's thoracentesis is going to be and what time. Nothing further was needed at this time.

## 2015-04-15 ENCOUNTER — Ambulatory Visit (HOSPITAL_COMMUNITY)
Admission: RE | Admit: 2015-04-15 | Discharge: 2015-04-15 | Disposition: A | Discharge status: Home / Self Care | Payer: Medicaid Other | Source: Ambulatory Visit | Attending: Adult Health | Admitting: Adult Health

## 2015-04-15 ENCOUNTER — Other Ambulatory Visit: Payer: Self-pay | Admitting: Adult Health

## 2015-04-15 ENCOUNTER — Ambulatory Visit (HOSPITAL_COMMUNITY)
Admission: RE | Admit: 2015-04-15 | Discharge: 2015-04-15 | Disposition: A | Discharge status: Home / Self Care | Payer: Medicaid Other | Source: Ambulatory Visit | Attending: Radiology | Admitting: Radiology

## 2015-04-15 DIAGNOSIS — R188 Other ascites: Secondary | ICD-10-CM | POA: Diagnosis not present

## 2015-04-15 DIAGNOSIS — J948 Other specified pleural conditions: Secondary | ICD-10-CM | POA: Diagnosis present

## 2015-04-15 DIAGNOSIS — J90 Pleural effusion, not elsewhere classified: Secondary | ICD-10-CM

## 2015-04-15 NOTE — Progress Notes (Signed)
Patient ID: Benjamin Bell, male   DOB: 10-05-1955, 59 y.o.   MRN: [id number] Patient presented to ultrasound department today for left thoracentesis. On limited ultrasound of left posterior chest there is not a significant amount of fluid to safely access with needle at this time. There is atelectatic lung as well as small complex multiloculated collection with possible blood products. Patient is currently leaking from prior left chest tube insertion site. Above findings discussed with Dr. Kendrick Fries. Decision made to proceed with CT chest . The studies were reviewed by Dr. Grace Isaac. Plan at this time is to have patient follow up with Dr. Kendrick Fries on 2/17. Pt was instructed to continue dressing changes as needed to prior left chest tube insertion site.

## 2015-04-16 ENCOUNTER — Encounter: Payer: Self-pay | Admitting: Adult Health

## 2015-04-16 ENCOUNTER — Ambulatory Visit (INDEPENDENT_AMBULATORY_CARE_PROVIDER_SITE_OTHER): Payer: Medicaid Other | Admitting: Adult Health

## 2015-04-16 VITALS — BP 118/60 | HR 106 | Temp 98.0°F | Ht 71.0 in | Wt 164.0 lb

## 2015-04-16 DIAGNOSIS — J90 Pleural effusion, not elsewhere classified: Secondary | ICD-10-CM

## 2015-04-16 DIAGNOSIS — J942 Hemothorax: Secondary | ICD-10-CM | POA: Diagnosis not present

## 2015-04-16 NOTE — Progress Notes (Signed)
Subjective:    Patient ID: Benjamin Bell, male    DOB: [birth year], 60 y.o.   MRN: [id number]  HPI 60 year old male smoker with COPD seen initial pulmonary consult during hospitalization for hemothorax . Small left apical pneumothorax. January 2017 Patient has chronic back pain and ETOH cirrhosis.(no etoh x 2012 ) .  Previous Hep C s/p treatment .  Previous polysubstance abuse with cocaine and Heroin .   04/16/2015 Follow up : Pleural Effusion   Returns for a follow up .  Patient was admitted in January 2017 after a severe fall with multiple left rib fractures and a left-sided traumatic hydropneumothorax. After discharge. Patient had significant shortness of breath and was readmitted to the hospital. He was initially treated with BiPAP support, but eventually had to be intubated. Chest tube was placed for drainage for hemothorax.  He had initial bloody drainage that changed over to serous fluid . He had high output and chest tube was in place for few days. He was tx w/ abx . Had small  apical PTX after CT was removed. Seen back in office 2/14 w/ CT suture site removed. CXR  shows a increased Left pleural effusion -moderate size. He was sent for Thoracentesis on 2/16 but [place] did not show any sign fluid . Subsequent CT chest showed small to moderate left effusion reaccumulation with partial loculation. Pt says since office visit he has had drainage serous drainage from previous CT site.  Has to change dressing often. No bloody drainage, no pus drainage. No fever or redness. Breathing is at baseline. No increased cough or congestion.  Says drainage is more at night when he lies down or coughs.  In office dressing was changed and site cleaned .  He denies any hemoptysis, orthopnea, PND, or fever. Has chronic leg swelling.    Past Medical History  Diagnosis Date  . Chronic back pain   . Hypertension   . COPD (chronic obstructive pulmonary disease) (HCC)   . ARF (acute renal failure) (HCC)    secondary to PNA  . AAA (abdominal aortic aneurysm) [facility])    Current Outpatient Prescriptions on File Prior to Visit  Medication Sig Dispense Refill  . albuterol (PROVENTIL HFA;VENTOLIN HFA) 108 (90 Base) MCG/ACT inhaler Inhale 1-2 puffs into the lungs every 4 (four) hours as needed for wheezing or shortness of breath.    Marland Kitchen amLODipine (NORVASC) 10 MG tablet Take 10 mg by mouth daily.    Marland Kitchen aspirin 81 MG chewable tablet Chew 1 tablet (81 mg total) by mouth daily.    Marland Kitchen ibuprofen (ADVIL,MOTRIN) 600 MG tablet Take 1 tablet (600 mg total) by mouth every 6 (six) hours as needed for moderate pain. 30 tablet 0  . lisinopril-hydrochlorothiazide (PRINZIDE,ZESTORETIC) 20-25 MG tablet Take 1 tablet by mouth daily.    . methadone (DOLOPHINE) 10 MG/5ML solution Take 35.5 mLs (71 mg total) by mouth daily. 120 mL 0  . mometasone-formoterol (DULERA) 100-5 MCG/ACT AERO Inhale 2 puffs into the lungs 2 (two) times daily. 1 Inhaler 6  . oxyCODONE-acetaminophen (PERCOCET) 7.5-325 MG tablet Take 1-2 tablets by mouth every 4 (four) hours as needed. 60 tablet 0  . potassium chloride (KLOR-CON) 20 MEQ packet Take 20 mEq by mouth daily.     No current facility-administered medications on file prior to visit.     Review of Systems Constitutional:   No  weight loss, night sweats,  Fevers, chills,  +fatigue, or  lassitude.  HEENT:   No headaches,  Difficulty swallowing,  Tooth/dental problems, or  Sore throat,                No sneezing, itching, ear ache, nasal congestion, post nasal drip,   CV:  No chest pain,  Orthopnea, PND , anasarca, dizziness, palpitations, syncope.   GI  No heartburn, indigestion, abdominal pain, nausea, vomiting, diarrhea, change in bowel habits, loss of appetite, bloody stools.   Resp:    No chest wall deformity  Skin: no rash or lesions.  GU: no dysuria, change in color of urine, no urgency or frequency.  No flank pain, no hematuria   MS:  No joint pain or swelling.  No decreased  range of motion.  No back pain.  Psych:  No change in mood or affect. No depression or anxiety.  No memory loss.         Objective:   Physical Exam Filed Vitals:   04/16/15 1629  BP: 118/60  Pulse: 106  Temp: 98 F (36.7 C)  TempSrc: Oral  Height:  (1.803 m)  Weight: 164 lb (74.39 kg)  SpO2: 93%    GEN: A/Ox3; pleasant , NAD frail appearing   HEENT:  [place]/AT,  EACs-clear, TMs-wnl, NOSE-clear, THROAT-clear, no lesions, no postnasal drip or exudate noted.   NECK:  Supple w/ fair ROM; no JVD; normal carotid impulses w/o bruits; no thyromegaly or nodules palpated; no lymphadenopathy.  RESP  Decreased BS on left , no accessory muscle use, no dullness to percussion  CARD:  RRR, no m/r/g  , 1+  peripheral edema, pulses intact, no cyanosis or clubbing.  GI:   Soft & nt; nml bowel sounds; no organomegaly or masses detected.  Musco: Warm bil, no deformities or joint swelling noted.   Neuro: alert, no focal deficits noted.    Skin: Warm, no lesions or rashes Along left lateral chest wall previous CT site without redness , serous drainage noted with cough . No bloody drainage.    CT chest 04/15/15  Re- accumulation of left pleural fluid as described above.       Assessment & Plan:

## 2015-04-16 NOTE — Patient Instructions (Signed)
Clean area gently , dry and cover with gauze pad , change if saturated. Call if signs of redness. Discolored drainage. Or fever.  Refer to Thoracic surgeon.  Work on not smoking .  Continue on oxygen.  Follow up with Dr. Kendrick Fries in 6 with  PFT.  Please contact office for sooner follow up if symptoms do not improve or worsen or seek emergency care

## 2015-04-16 NOTE — Assessment & Plan Note (Signed)
Left pleural effusion reaccumulation s/p CT drainage  Unable to do thoracenteiss . Effusion is partially loculated and has ongoing drainage at site.  Case and scans reviewed with Dr. Kendrick Fries in detail  Will have pt keep area clean and dry with dressing changes  Advised to report signs of infection.  Refer to Thoracic surgeon for evaluation   Plan  Clean area gently , dry and cover with gauze pad , change if saturated. Call if signs of redness. Discolored drainage. Or fever.  Refer to Thoracic surgeon.  Work on not smoking .  Continue on oxygen.  Follow up with Dr. Kendrick Fries in 6 with  PFT.  Please contact office for sooner follow up if symptoms do not improve or worsen or seek emergency care

## 2015-04-21 NOTE — Progress Notes (Signed)
Case discussed in detail with Tammy Parrett and [facility].  Difficult situation but will hopefully dry up over the next few weeks.  If no improvement will need to consider pleuridesis.  He is not a great operative candidate.

## 2015-04-22 ENCOUNTER — Encounter: Payer: Self-pay | Admitting: Thoracic Surgery (Cardiothoracic Vascular Surgery)

## 2015-04-22 ENCOUNTER — Institutional Professional Consult (permissible substitution) (INDEPENDENT_AMBULATORY_CARE_PROVIDER_SITE_OTHER): Payer: Medicaid Other | Admitting: Thoracic Surgery (Cardiothoracic Vascular Surgery)

## 2015-04-22 VITALS — BP 118/62 | HR 105 | Resp 18 | Ht 71.0 in | Wt 189.0 lb

## 2015-04-22 DIAGNOSIS — J90 Pleural effusion, not elsewhere classified: Secondary | ICD-10-CM

## 2015-04-22 DIAGNOSIS — J948 Other specified pleural conditions: Secondary | ICD-10-CM

## 2015-04-22 DIAGNOSIS — M542 Cervicalgia: Secondary | ICD-10-CM | POA: Diagnosis not present

## 2015-04-22 NOTE — Progress Notes (Signed)
PCP is TALBOT, Dineen Kid, MD Referring Provider is Parrett, Virgel Bouquet, NP  Chief Complaint  Patient presents with  . Pleural Effusion    recurrent left after traumatic hydropnemothorax after fall/rib fxs.Marland KitchenMarland KitchenCT CHEST 04/15/15    HPI: 60 year old man sent for consultation regarding loculated pleural effusion.  Mr. Botting is a 60 year old man with a past medical history significant for polysubstance abuse, cirrhosis, treated hepatitis C, abdominal aortic aneurysm, pulmonary nodules, an adrenal mass, and a recent fall which left him hospitalized.  He was hospitalized in early January after falling on ice. He had multiple rib fractures. He initially had a left pneumothorax that did not require chest tube placement. He was discharged after a couple of weeks but readmitted with respiratory failure 2 days later. He had a left chest tube placed and 2 L of bloody fluid was evacuated. The chest tube ultimately was removed prior to discharge. He was sent home with home oxygen.  He recently saw Tammy Parrett pulmonary office for follow-up. A repeat chest x-ray showed partial reaccumulation of his left pleural effusion. An attempted ultrasound-guided thoracentesis was unsuccessful due to loculations. A CT was done on 04/15/2015 which showed multiple loculated fluid collections in the left chest.  He continues to have left-sided chest pain. He has noted a lot of drainage from his chest tube site. He says the drainage is serous. He has not noticed any blood or posterior his breathing is slowly improving. He is only using oxygen occasionally now. He did not wear it to his office visit today.   Past Medical History  Diagnosis Date  . Chronic back pain   . Hypertension   . COPD (chronic obstructive pulmonary disease) (HCC)   . ARF (acute renal failure) (HCC)     secondary to PNA  . AAA (abdominal aortic aneurysm) (HCC)     No past surgical history on file.  No family history on file.  Social History Social  History  Substance Use Topics  . Smoking status: Current Every Day Smoker -- 0.50 packs/day for 43 years    Types: Cigarettes  . Smokeless tobacco: Never Used  . Alcohol Use: No    Current Outpatient Prescriptions  Medication Sig Dispense Refill  . albuterol (PROVENTIL HFA;VENTOLIN HFA) 108 (90 Base) MCG/ACT inhaler Inhale 1-2 puffs into the lungs every 4 (four) hours as needed for wheezing or shortness of breath.    Marland Kitchen amLODipine (NORVASC) 10 MG tablet Take 10 mg by mouth daily.    Marland Kitchen aspirin 81 MG chewable tablet Chew 1 tablet (81 mg total) by mouth daily.    Marland Kitchen ibuprofen (ADVIL,MOTRIN) 600 MG tablet Take 1 tablet (600 mg total) by mouth every 6 (six) hours as needed for moderate pain. 30 tablet 0  . lisinopril-hydrochlorothiazide (PRINZIDE,ZESTORETIC) 20-25 MG tablet Take 1 tablet by mouth daily.    . methadone (DOLOPHINE) 10 MG/5ML solution Take 35.5 mLs (71 mg total) by mouth daily. 120 mL 0  . mometasone-formoterol (DULERA) 100-5 MCG/ACT AERO Inhale 2 puffs into the lungs 2 (two) times daily. 1 Inhaler 6  . potassium chloride (KLOR-CON) 20 MEQ packet Take 20 mEq by mouth daily.     No current facility-administered medications for this visit.    No Known Allergies  Review of Systems  Constitutional: Positive for activity change and fatigue. Negative for fever and chills.  HENT: Negative for trouble swallowing and voice change.   Respiratory: Positive for shortness of breath (improving).        Left-sided chest  wall pain with deep breath or cough  Cardiovascular: Positive for leg swelling.  Gastrointestinal: Positive for abdominal pain (Frequent heartburn). Negative for blood in stool.  Genitourinary: Negative for hematuria and difficulty urinating.       Kidney stones  Musculoskeletal: Positive for joint swelling, arthralgias, gait problem, neck pain and neck stiffness.    BP 118/62 mmHg  Pulse 105  Resp 18  Ht  (1.803 m)  Wt 189 lb (85.73 kg)  BMI 26.37 kg/m2  SpO2  94% Physical Exam  Constitutional: He is oriented to person, place, and time. He appears well-developed and well-nourished. No distress.  HENT:  Head: Normocephalic and atraumatic.  Mouth/Throat: No oropharyngeal exudate.  Eyes: Conjunctivae and EOM are normal. No scleral icterus.  Neck: No tracheal deviation present. No thyromegaly present.  Cardiovascular: Normal rate, regular rhythm and normal heart sounds.   Pulmonary/Chest: Effort normal.  Slightly diminished breath sounds on left side posteriorly  Abdominal: Soft. He exhibits distension. There is no tenderness.  Neurological: He is alert and oriented to person, place, and time. No cranial nerve deficit.  Skin: Skin is warm and dry.  Multiple ecchymoses  Vitals reviewed.    Diagnostic Tests: CT CHEST WITHOUT CONTRAST  TECHNIQUE: Multidetector CT imaging of the chest was performed following the standard protocol without IV contrast.  COMPARISON: CT scan March 27, 2015.  FINDINGS: Central airways are within normal limits. Emphysematous changes are seen in the lungs. The patchy opacities in the right lung on the previous study have improved but mild ground-glass opacity remains anteriorly in the right upper lobe. No right-sided pleural effusion. The left-sided pleural effusion has reaccumulated since the chest tube was removed. Portions of the fluid appear to be loculated. A pocket of loculated fluid superiorly and laterally on series 2, image 14 measures 3 cm in thickness. More inferiorly, there is more fluid measuring 4.3 cm in thickness on series 2, image 46 posteriorly. No pericardial effusion identified. Coronary artery calcifications are noted. The central great vessels are unchanged. Shotty nodes are likely reactive and unchanged. No adenopathy. Stable pulmonary nodules are seen on the right. No new pulmonary nodules identified. There is atelectasis associated with the left effusion. Air to the right of the  liver on series 2, image 61 is likely within colon based on previous CT imaging. There is ascites in the abdomen, particularly adjacent to the liver which is little more prominent. No fluid is seen adjacent to the spleen on today's study. No other changes in the upper abdomen. The bones are stable with stable compression fractures in the thoracic spine.  IMPRESSION: Re- accumulation of left pleural fluid as described above.   Electronically Signed  By: Gerome Sam III M.D  On: 04/15/2015 11:52      I reviewed the CT chest and compared to his previous films. There are multiple small loculated fluid collections in the left chest  Impression:  Mr. Schuld is his 60 year old man with multiple medical issues who had a recent fall resulting in multiple rib fractures. He initially had a pneumothorax and subsequently developed a hemothorax which was treated with a chest tube.  He now is having some drainage from his chest tube site. The drainage is serous in nature and not bloody or purulent. A CT done last week showed multiple small loculated fluid collections in the left chest.  Reviewing his CT scan there are multiple small loculated fluid collections. I don't think there is enough fluid to warrant thoracic surgical intervention at  this time. He would be unlikely to see significant pulmonary functional improvement with VATS under the circumstances.   It is possible that his pleural effusion could worsen to the point that surgery is indicated. He has a follow-up appointment with Dr. Kendrick Fries in about a month. He will have repeat imaging at that time.  He complains of some neck pain. He did have a CT of head and neck done during his trauma admission and his cervical spine was cleared. Recommend that he contact the trauma surgeons regarding his neck pain.  Plan:   I will be happy to see Mr. Bischof back in the future if I can be of any assistance with his care.  I spent 20 minutes  with Mr. Bowles during this visit, greater than 50% was spent in counseling  Loreli Slot, MD [facility] [phone]

## 2015-04-23 ENCOUNTER — Ambulatory Visit: Payer: Medicaid Other | Admitting: Cardiology

## 2015-05-06 ENCOUNTER — Telehealth: Payer: Self-pay | Admitting: Pulmonary Disease

## 2015-05-06 ENCOUNTER — Telehealth: Payer: Self-pay

## 2015-05-06 NOTE — Telephone Encounter (Signed)
Spoke with the pt  He is asking for refill on oxycodone  We never prescribed this  This was given by a PA in the hospital for neck pain  I advised that he should call his PCP or ortho if he has one for neck pain  He verbalized understanding and nothing further needed

## 2015-05-06 NOTE — Telephone Encounter (Signed)
Pt lm on triage rq rf of pain med. Pt is not seen in this office.   Pt spouse informed that they contacted the wrong office.

## 2015-05-08 ENCOUNTER — Emergency Department (HOSPITAL_COMMUNITY): Payer: Medicaid Other

## 2015-05-08 ENCOUNTER — Encounter (HOSPITAL_COMMUNITY): Payer: Self-pay | Admitting: Nurse Practitioner

## 2015-05-08 ENCOUNTER — Inpatient Hospital Stay (HOSPITAL_COMMUNITY)
Admission: EM | Admit: 2015-05-08 | Discharge: 2015-05-15 | DRG: 164 | Disposition: A | Discharge status: Home Health | Payer: Medicaid Other | Attending: Internal Medicine | Admitting: Internal Medicine

## 2015-05-08 DIAGNOSIS — F1120 Opioid dependence, uncomplicated: Secondary | ICD-10-CM | POA: Diagnosis not present

## 2015-05-08 DIAGNOSIS — R160 Hepatomegaly, not elsewhere classified: Secondary | ICD-10-CM

## 2015-05-08 DIAGNOSIS — K7460 Unspecified cirrhosis of liver: Secondary | ICD-10-CM | POA: Diagnosis present

## 2015-05-08 DIAGNOSIS — J939 Pneumothorax, unspecified: Secondary | ICD-10-CM

## 2015-05-08 DIAGNOSIS — M549 Dorsalgia, unspecified: Secondary | ICD-10-CM | POA: Diagnosis present

## 2015-05-08 DIAGNOSIS — Z79891 Long term (current) use of opiate analgesic: Secondary | ICD-10-CM

## 2015-05-08 DIAGNOSIS — Z9981 Dependence on supplemental oxygen: Secondary | ICD-10-CM

## 2015-05-08 DIAGNOSIS — J441 Chronic obstructive pulmonary disease with (acute) exacerbation: Secondary | ICD-10-CM | POA: Diagnosis present

## 2015-05-08 DIAGNOSIS — R0602 Shortness of breath: Secondary | ICD-10-CM | POA: Diagnosis present

## 2015-05-08 DIAGNOSIS — N179 Acute kidney failure, unspecified: Secondary | ICD-10-CM | POA: Diagnosis present

## 2015-05-08 DIAGNOSIS — I10 Essential (primary) hypertension: Secondary | ICD-10-CM | POA: Diagnosis present

## 2015-05-08 DIAGNOSIS — M542 Cervicalgia: Secondary | ICD-10-CM | POA: Diagnosis present

## 2015-05-08 DIAGNOSIS — T501X5A Adverse effect of loop [high-ceiling] diuretics, initial encounter: Secondary | ICD-10-CM | POA: Diagnosis present

## 2015-05-08 DIAGNOSIS — J90 Pleural effusion, not elsewhere classified: Secondary | ICD-10-CM | POA: Diagnosis present

## 2015-05-08 DIAGNOSIS — E876 Hypokalemia: Secondary | ICD-10-CM | POA: Diagnosis present

## 2015-05-08 DIAGNOSIS — Z8619 Personal history of other infectious and parasitic diseases: Secondary | ICD-10-CM | POA: Diagnosis not present

## 2015-05-08 DIAGNOSIS — K921 Melena: Secondary | ICD-10-CM | POA: Diagnosis not present

## 2015-05-08 DIAGNOSIS — Z09 Encounter for follow-up examination after completed treatment for conditions other than malignant neoplasm: Secondary | ICD-10-CM

## 2015-05-08 DIAGNOSIS — G894 Chronic pain syndrome: Secondary | ICD-10-CM | POA: Diagnosis present

## 2015-05-08 DIAGNOSIS — Z7982 Long term (current) use of aspirin: Secondary | ICD-10-CM | POA: Diagnosis not present

## 2015-05-08 DIAGNOSIS — I714 Abdominal aortic aneurysm, without rupture: Secondary | ICD-10-CM | POA: Diagnosis present

## 2015-05-08 DIAGNOSIS — F17210 Nicotine dependence, cigarettes, uncomplicated: Secondary | ICD-10-CM | POA: Diagnosis present

## 2015-05-08 DIAGNOSIS — J948 Other specified pleural conditions: Secondary | ICD-10-CM | POA: Diagnosis not present

## 2015-05-08 DIAGNOSIS — K7031 Alcoholic cirrhosis of liver with ascites: Secondary | ICD-10-CM | POA: Diagnosis not present

## 2015-05-08 DIAGNOSIS — F1010 Alcohol abuse, uncomplicated: Secondary | ICD-10-CM | POA: Diagnosis not present

## 2015-05-08 DIAGNOSIS — B1920 Unspecified viral hepatitis C without hepatic coma: Secondary | ICD-10-CM | POA: Diagnosis present

## 2015-05-08 DIAGNOSIS — R188 Other ascites: Secondary | ICD-10-CM | POA: Diagnosis present

## 2015-05-08 LAB — CBC WITH DIFFERENTIAL/PLATELET
BASOS PCT: 0 %
Basophils Absolute: 0 10*3/uL (ref 0.0–0.1)
Eosinophils Absolute: 0.1 10*3/uL (ref 0.0–0.7)
Eosinophils Relative: 1 %
HEMATOCRIT: 41 % (ref 39.0–52.0)
Hemoglobin: 13.1 g/dL (ref 13.0–17.0)
LYMPHS PCT: 13 %
Lymphs Abs: 0.9 10*3/uL (ref 0.7–4.0)
MCH: 29.5 pg (ref 26.0–34.0)
MCHC: 32 g/dL (ref 30.0–36.0)
MCV: 92.3 fL (ref 78.0–100.0)
MONO ABS: 0.6 10*3/uL (ref 0.1–1.0)
MONOS PCT: 8 %
NEUTROS ABS: 5.2 10*3/uL (ref 1.7–7.7)
Neutrophils Relative %: 78 %
Platelets: 189 10*3/uL (ref 150–400)
RBC: 4.44 MIL/uL (ref 4.22–5.81)
RDW: 16 % — AB (ref 11.5–15.5)
WBC: 6.7 10*3/uL (ref 4.0–10.5)

## 2015-05-08 LAB — I-STAT ARTERIAL BLOOD GAS, ED
Acid-Base Excess: 4 mmol/L — ABNORMAL HIGH (ref 0.0–2.0)
Acid-Base Excess: 5 mmol/L — ABNORMAL HIGH (ref 0.0–2.0)
BICARBONATE: 30.8 meq/L — AB (ref 20.0–24.0)
BICARBONATE: 31.1 meq/L — AB (ref 20.0–24.0)
O2 Saturation: 89 %
O2 Saturation: 93 %
PCO2 ART: 55.2 mmHg — AB (ref 35.0–45.0)
PCO2 ART: 52.6 mmHg — AB (ref 35.0–45.0)
PH ART: 7.354 (ref 7.350–7.450)
PH ART: 7.38 (ref 7.350–7.450)
PO2 ART: 61 mmHg — AB (ref 80.0–100.0)
PO2 ART: 69 mmHg — AB (ref 80.0–100.0)
Patient temperature: 98.6
TCO2: 32 mmol/L (ref 0–100)
TCO2: 33 mmol/L (ref 0–100)

## 2015-05-08 LAB — PROTIME-INR
INR: 1.44 (ref 0.00–1.49)
PROTHROMBIN TIME: 17.6 s — AB (ref 11.6–15.2)

## 2015-05-08 LAB — CBC
HEMATOCRIT: 38 % — AB (ref 39.0–52.0)
HEMOGLOBIN: 12.2 g/dL — AB (ref 13.0–17.0)
MCH: 29.5 pg (ref 26.0–34.0)
MCHC: 32.1 g/dL (ref 30.0–36.0)
MCV: 91.8 fL (ref 78.0–100.0)
PLATELETS: 164 10*3/uL (ref 150–400)
RBC: 4.14 MIL/uL — AB (ref 4.22–5.81)
RDW: 15.9 % — ABNORMAL HIGH (ref 11.5–15.5)
WBC: 5 10*3/uL (ref 4.0–10.5)

## 2015-05-08 LAB — COMPREHENSIVE METABOLIC PANEL
ALBUMIN: 2.2 g/dL — AB (ref 3.5–5.0)
ALK PHOS: 190 U/L — AB (ref 38–126)
ALT: 22 U/L (ref 17–63)
ANION GAP: 10 (ref 5–15)
AST: 54 U/L — AB (ref 15–41)
BUN: 28 mg/dL — AB (ref 6–20)
CALCIUM: 8.5 mg/dL — AB (ref 8.9–10.3)
CO2: 29 mmol/L (ref 22–32)
Chloride: 97 mmol/L — ABNORMAL LOW (ref 101–111)
Creatinine, Ser: 1.39 mg/dL — ABNORMAL HIGH (ref 0.61–1.24)
GFR calc Af Amer: >60 mL/min (ref >60)
GFR calc non Af Amer: 54 mL/min — ABNORMAL LOW (ref >60)
GLUCOSE: 137 mg/dL — AB (ref 65–99)
Potassium: 2.3 mmol/L — CL (ref 3.5–5.1)
SODIUM: 136 mmol/L (ref 135–145)
Total Bilirubin: 0.8 mg/dL (ref 0.3–1.2)
Total Protein: 6.3 g/dL — ABNORMAL LOW (ref 6.5–8.1)

## 2015-05-08 LAB — AMMONIA: Ammonia: 45 umol/L — ABNORMAL HIGH (ref 9–35)

## 2015-05-08 LAB — I-STAT TROPONIN, ED: Troponin i, poc: 0 ng/mL (ref 0.00–0.08)

## 2015-05-08 LAB — I-STAT CG4 LACTIC ACID, ED
LACTIC ACID, VENOUS: 1.08 mmol/L (ref 0.5–2.0)
Lactic Acid, Venous: 2.15 mmol/L (ref 0.5–2.0)

## 2015-05-08 LAB — CREATININE, SERUM
Creatinine, Ser: 1.28 mg/dL — ABNORMAL HIGH (ref 0.61–1.24)
GFR calc non Af Amer: >60 mL/min (ref >60)

## 2015-05-08 LAB — ETHANOL: Alcohol, Ethyl (B): <5 mg/dL (ref <5)

## 2015-05-08 MED ORDER — POTASSIUM CHLORIDE 10 MEQ/100ML IV SOLN
10.0000 meq | INTRAVENOUS | Status: AC
  Administered 2015-05-08 (×2): 10 meq via INTRAVENOUS
  Filled 2015-05-08 (×2): qty 100

## 2015-05-08 MED ORDER — IPRATROPIUM-ALBUTEROL 0.5-2.5 (3) MG/3ML IN SOLN
3.0000 mL | Freq: Four times a day (QID) | RESPIRATORY_TRACT | Status: DC
  Administered 2015-05-09 – 2015-05-12 (×14): 3 mL via RESPIRATORY_TRACT
  Filled 2015-05-08 (×15): qty 3

## 2015-05-08 MED ORDER — POTASSIUM CHLORIDE 10 MEQ/100ML IV SOLN
10.0000 meq | INTRAVENOUS | Status: AC
  Administered 2015-05-09 (×4): 10 meq via INTRAVENOUS
  Filled 2015-05-08: qty 100

## 2015-05-08 MED ORDER — SODIUM CHLORIDE 0.9 % IV BOLUS (SEPSIS)
1000.0000 mL | Freq: Once | INTRAVENOUS | Status: AC
  Administered 2015-05-08: 1000 mL via INTRAVENOUS

## 2015-05-08 MED ORDER — MOMETASONE FURO-FORMOTEROL FUM 100-5 MCG/ACT IN AERO
2.0000 | INHALATION_SPRAY | Freq: Two times a day (BID) | RESPIRATORY_TRACT | Status: DC
Start: 2015-05-08 — End: 2015-05-15
  Administered 2015-05-09 – 2015-05-14 (×10): 2 via RESPIRATORY_TRACT
  Filled 2015-05-08: qty 8.8

## 2015-05-08 MED ORDER — SODIUM CHLORIDE 0.9 % IV SOLN
250.0000 mL | INTRAVENOUS | Status: DC | PRN
  Administered 2015-05-09: 250 mL via INTRAVENOUS

## 2015-05-08 MED ORDER — PIPERACILLIN-TAZOBACTAM 3.375 G IVPB
3.3750 g | Freq: Three times a day (TID) | INTRAVENOUS | Status: DC
  Administered 2015-05-09 – 2015-05-11 (×8): 3.375 g via INTRAVENOUS
  Filled 2015-05-08 (×9): qty 50

## 2015-05-08 MED ORDER — HEPARIN SODIUM (PORCINE) 5000 UNIT/ML IJ SOLN
5000.0000 [IU] | Freq: Three times a day (TID) | INTRAMUSCULAR | Status: DC
  Administered 2015-05-09 – 2015-05-10 (×6): 5000 [IU] via SUBCUTANEOUS
  Filled 2015-05-08 (×7): qty 1

## 2015-05-08 MED ORDER — VANCOMYCIN HCL IN DEXTROSE 1-5 GM/200ML-% IV SOLN
1000.0000 mg | Freq: Two times a day (BID) | INTRAVENOUS | Status: DC
  Administered 2015-05-09 – 2015-05-11 (×5): 1000 mg via INTRAVENOUS
  Filled 2015-05-08 (×6): qty 200

## 2015-05-08 MED ORDER — VANCOMYCIN HCL IN DEXTROSE 1-5 GM/200ML-% IV SOLN
1000.0000 mg | INTRAVENOUS | Status: AC
  Administered 2015-05-08: 1000 mg via INTRAVENOUS
  Filled 2015-05-08: qty 200

## 2015-05-08 MED ORDER — PIPERACILLIN-TAZOBACTAM 3.375 G IVPB 30 MIN
3.3750 g | INTRAVENOUS | Status: AC
  Administered 2015-05-08: 3.375 g via INTRAVENOUS
  Filled 2015-05-08: qty 50

## 2015-05-08 MED ORDER — ASPIRIN 300 MG RE SUPP: 300.0000 mg | RECTAL | Status: AC

## 2015-05-08 MED ORDER — ASPIRIN 81 MG PO CHEW
324.0000 mg | CHEWABLE_TABLET | ORAL | Status: AC
  Administered 2015-05-09: 324 mg via ORAL
  Filled 2015-05-08: qty 4

## 2015-05-08 MED ORDER — ALBUTEROL SULFATE (2.5 MG/3ML) 0.083% IN NEBU: 2.5000 mg | INHALATION_SOLUTION | RESPIRATORY_TRACT | Status: DC

## 2015-05-08 NOTE — ED Provider Notes (Signed)
CSN: [id number]     Arrival date & time 05/08/15  1401 History   First MD Initiated Contact with Patient 05/08/15 1423     Chief Complaint  Patient presents with  . Shortness of Breath     (Consider location/radiation/quality/duration/timing/severity/associated sxs/prior Treatment) HPI   Patient is a chronically ill 60 year old male with history of polysubstance abuse, cirrhosis, treated hep C, and loculated left pulmonary infection. Patient had a fall in January and noted to have multiple rib fractures he had a left pneumothorax at that time with a chest tube placed. He had had chest tube removed and sent home with oxygen he had a reaccumulation of the left pleural effusion. CT in February shows multiple loculated fluid collections in the chest. He continued have drainage at that time.  Since then and last 3 weeks patient has had closure where the chest tube  chest tube used to be. In addition he's had an increasing shortness of breath and confusion from baseline.   He is followed by Dr. Dorris Fetch from cardiovascular surgery and Dr. Kathrin Penner from pulmonology.  At the time when he was seen in late February Dr. Dorris Fetch he did not believe that he had significant fluid enough to warrant VATs at that time.  However it sounds if patient has had clinical worsening since then.  Past Medical History  Diagnosis Date  . Chronic back pain   . Hypertension   . COPD (chronic obstructive pulmonary disease) (HCC)   . ARF (acute renal failure) (HCC)     secondary to PNA  . AAA (abdominal aortic aneurysm) (HCC)    History reviewed. No pertinent past surgical history. No family history on file. Social History  Substance Use Topics  . Smoking status: Current Every Day Smoker -- 0.50 packs/day for 43 years    Types: Cigarettes  . Smokeless tobacco: Never Used  . Alcohol Use: No    Review of Systems  Constitutional: Positive for activity change and fatigue.  HENT: Negative for congestion.    Respiratory: Positive for cough and shortness of breath.   Cardiovascular: Negative for chest pain.  Gastrointestinal: Negative for abdominal pain.  Genitourinary: Negative for dysuria.  Musculoskeletal: Negative for arthralgias.  Allergic/Immunologic: Negative for immunocompromised state.  Neurological: Negative for speech difficulty.  Psychiatric/Behavioral: Positive for confusion. Negative for behavioral problems.  All other systems reviewed and are negative.     Allergies  Review of patient's allergies indicates no known allergies.  Home Medications   Prior to Admission medications   Medication Sig Start Date End Date Taking? Authorizing Provider  albuterol (PROVENTIL HFA;VENTOLIN HFA) 108 (90 Base) MCG/ACT inhaler Inhale 1-2 puffs into the lungs every 4 (four) hours as needed for wheezing or shortness of breath.   Yes Historical Provider, MD  amLODipine (NORVASC) 10 MG tablet Take 10 mg by mouth daily.   Yes Historical Provider, MD  aspirin 81 MG chewable tablet Chew 1 tablet (81 mg total) by mouth daily. 03/31/15  Yes Simonne Martinet, NP  ibuprofen (ADVIL,MOTRIN) 600 MG tablet Take 1 tablet (600 mg total) by mouth every 6 (six) hours as needed for moderate pain. 03/31/15  Yes Simonne Martinet, NP  lisinopril-hydrochlorothiazide (PRINZIDE,ZESTORETIC) 20-25 MG tablet Take 1 tablet by mouth daily.   Yes Historical Provider, MD  methadone (DOLOPHINE) 10 MG/5ML solution Take 35.5 mLs (71 mg total) by mouth daily. 03/23/15  Yes [name], PA-C  mometasone-formoterol (DULERA) 100-5 MCG/ACT AERO Inhale 2 puffs into the lungs 2 (two) times daily.  03/31/15  Yes Simonne Martinet, NP  Multiple Vitamins-Minerals (MENS MULTI VITAMIN & MINERAL PO) Take 1 tablet by mouth daily.   Yes Historical Provider, MD  potassium chloride (KLOR-CON) 20 MEQ packet Take 20 mEq by mouth daily.   Yes Historical Provider, MD   BP 104/75 mmHg  Pulse 95  Temp(Src) 98.2 F (36.8 C) (Oral)  Resp 18  Ht   (1.778 m)  Wt 185 lb (83.915 kg)  BMI 26.54 kg/m2  SpO2 97% Physical Exam  Constitutional: He appears well-nourished.  Ill-appearing 60 year old male on oxygen.  HENT:  Head: Normocephalic.  Dry mucous membranes.  Eyes: Conjunctivae are normal.  Neck: No tracheal deviation present.  Cardiovascular: Normal rate.   Pulmonary/Chest: No stridor.  No breath sounds on the left lung fields.  Crease work of breathing.  Abdominal: Soft. There is no tenderness.  Old scarring to abdomen  Musculoskeletal: Normal range of motion.  Neurological: No cranial nerve deficit.  Patient fatigued, confused.  Skin: Skin is warm and dry. No rash noted. He is not diaphoretic.  Nursing note and vitals reviewed.   ED Course  Procedures (including critical care time) Labs Review Labs Reviewed  COMPREHENSIVE METABOLIC PANEL - Abnormal; Notable for the following:    Potassium 2.3 (*)    Chloride 97 (*)    Glucose, Bld 137 (*)    BUN 28 (*)    Creatinine, Ser 1.39 (*)    Calcium 8.5 (*)    Total Protein 6.3 (*)    Albumin 2.2 (*)    AST 54 (*)    Alkaline Phosphatase 190 (*)    GFR calc non Af Amer 54 (*)    All other components within normal limits  CBC WITH DIFFERENTIAL/PLATELET - Abnormal; Notable for the following:    RDW 16.0 (*)    All other components within normal limits  AMMONIA - Abnormal; Notable for the following:    Ammonia 45 (*)    All other components within normal limits  PROTIME-INR - Abnormal; Notable for the following:    Prothrombin Time 17.6 (*)    All other components within normal limits  I-STAT CG4 LACTIC ACID, ED - Abnormal; Notable for the following:    Lactic Acid, Venous 2.15 (*)    All other components within normal limits  CULTURE, BLOOD (ROUTINE X 2)  CULTURE, BLOOD (ROUTINE X 2)  ETHANOL  URINE RAPID DRUG SCREEN, HOSP PERFORMED  BLOOD GAS, ARTERIAL  I-STAT TROPOININ, ED    Imaging Review Dg Chest 2 View  05/08/2015  CLINICAL DATA:  Shortness of  breath EXAM: CHEST  2 VIEW COMPARISON:  04/13/2015 FINDINGS: Interval progression of left pleural effusion which is now moderate to large in character. Right lung appears hyperexpanded. Cardiopericardial silhouette is obscured. Interstitial markings are diffusely coarsened with chronic features. Multiple nonacute posterior left rib fractures noted. IMPRESSION: Interval progression of left pleural effusion, now moderate to large in size. Electronically Signed   By: Kennith Center M.D.   On: 05/08/2015 15:49   Dg Cervical Spine Complete  05/08/2015  CLINICAL DATA:  Recent left-sided rib fracture. Progressive shortness of breath for 2 hours. EXAM: CERVICAL SPINE - COMPLETE 4+ VIEW COMPARISON:  None. FINDINGS: Mild reversal of normal lordosis is identified. Minimal anterolisthesis of C2 versus C3 and C3 versus C4 is stable. The pre odontoid space and prevertebral soft tissues are normal. No fractures. The neural foramina are patent. Dedicated views of the odontoid process are normal. The lateral masses of  C1 align with C2. The cervical spine is only seen to the bottom of C6 on the lateral view. Chronic calcifications are identified in the neck. IMPRESSION: 1. Limited study only seen to the bottom of C6 on the lateral view. No interval change or acute abnormality identified. 2. Carotid calcifications. Electronically Signed   By: Gerome Samavid  Williams III M.D   On: 05/08/2015 15:53   I have personally reviewed and evaluated these images and lab results as part of my medical decision-making.   EKG Interpretation None      MDM   Final diagnoses:  None   Patient is a chronically ill 60 year old male with history of polysubstance abuse, cirrhosis, treated hep C, and loculated left pulmonary infection  patient followed by Dr. Dorris FetchHendrickson of CT surgery and Dr. Kathrin PennerMcQuade of pulmonology. It sounds if they are following patient to see if he improves or does not. There is consideration of surgery on patient. Patient's  caretaker reports that he ran out of oxygen at home and they were unable to deliver anymore. Since then patient's been increasingly confused. Patient is a 92% on 4 L in the emergency room. Patient has increased work of breathing and no breath sounds on left lung fields   5:20 PM  Labs thus far are consistent with mild AKA I, hypokalemia, elevated ammonia, and elevated lactate.   X-ray shows acute reaccumulation of fluid in the left lung. This compared with chest x-ray done in mid February.  We'll touch pain with both CT surgery and pulmonology.  Given elevation in lactate, reaccumulation of fluid in the left lung, and clinical worsening would consider broad spectrum antibiotics for HCAP.    Will initiate, though suspect that it is likely localized loculations as a caisse of the worsening left lung appearance.   5:20 PM Patietn mildly more confused. ABG ordered.  Discussed with Hendrcikson's coverage. CT surgery will be following with patient.  5:20 PM Discussed with critical care. They will evaluate patient. Patient requiring 4 L to maintain oxygenation at 92% with a large effusion on the left. Mild tachypnea. Patient increasingly altered however patient has history of polysubstance abuse, mildly elevated ammonia. Gas pending.  Olusegun Gerstenberger Randall AnLyn Johonna Binette, MD 05/08/15 1744

## 2015-05-08 NOTE — Progress Notes (Signed)
Pharmacy Antibiotic Note  Benjamin Bell is a 60 y.o. male admitted on 05/08/2015 with c/o SOB.  Pharmacy has been consulted for dosing vancomycin and zosyn for suspected sepsis/PNA.  - 3/1 CXR: progression of left pleural effusion - LA 2.15 on 3/11 - afeb, wbc wnl   Plan: - zosyn 3.375gm IV x1 over 30 minutes, then 3.375gm IV q8h (infuse over 4 hrs) - vancomycin 1gm IV q12h  Height: 5\' 10"  (177.8 cm) Weight: 185 lb (83.915 kg) IBW/kg (Calculated) : 73  Temp (24hrs), Avg:98.2 F (36.8 C), Min:98.2 F (36.8 C), Max:98.2 F (36.8 C)   Recent Labs Lab 05/08/15 1512 05/08/15 1534  WBC 6.7  --   CREATININE 1.39*  --   LATICACIDVEN  --  2.15*    Estimated Creatinine Clearance: 59.8 mL/min (by C-G formula based on Cr of 1.39).    No Known Allergies  Antimicrobials this admission: 3/11 vanc>> 3/11 zosyn>>  Levels/dose changes this admission: n/a  Microbiology results: No cx yet  Thank you for allowing pharmacy to be a part of this patient's care.  Benjamin Bell, Benjamin Bell P 05/08/2015 4:29 PM

## 2015-05-08 NOTE — ED Notes (Signed)
[facility]. Patient has COPD and recent hx of left sided rib fracture and smokes 2ppd. Patient had acute episode of progressive shob over the past 3 hours. Patient used O2 as needed and rescue inhaler without relief. Upon fire's arrival patient SpO2 noted to be 68%/RA. EMS noted diminished breath sounds in bilateral upper lobes and wheezing in bilateral lowe lobes with accessory muscle use. Patient given 5mg  of albuterol and 0.5mg  of atrovent. Patient given 125 mg of solumederol.

## 2015-05-08 NOTE — ED Notes (Signed)
MD notified that pt is becoming increasingly lethargic. Pt is responding to minor touch stimulation at this time.

## 2015-05-08 NOTE — ED Notes (Signed)
Patient transported to X-ray 

## 2015-05-09 ENCOUNTER — Inpatient Hospital Stay (HOSPITAL_COMMUNITY): Payer: Medicaid Other

## 2015-05-09 DIAGNOSIS — J90 Pleural effusion, not elsewhere classified: Principal | ICD-10-CM

## 2015-05-09 DIAGNOSIS — F1120 Opioid dependence, uncomplicated: Secondary | ICD-10-CM | POA: Diagnosis present

## 2015-05-09 DIAGNOSIS — K7031 Alcoholic cirrhosis of liver with ascites: Secondary | ICD-10-CM

## 2015-05-09 DIAGNOSIS — J441 Chronic obstructive pulmonary disease with (acute) exacerbation: Secondary | ICD-10-CM

## 2015-05-09 LAB — BASIC METABOLIC PANEL
Anion gap: 11 (ref 5–15)
BUN: 29 mg/dL — AB (ref 6–20)
CHLORIDE: 102 mmol/L (ref 101–111)
CO2: 28 mmol/L (ref 22–32)
CREATININE: 1.19 mg/dL (ref 0.61–1.24)
Calcium: 8.1 mg/dL — ABNORMAL LOW (ref 8.9–10.3)
GFR calc Af Amer: >60 mL/min (ref >60)
GFR calc non Af Amer: >60 mL/min (ref >60)
Glucose, Bld: 121 mg/dL — ABNORMAL HIGH (ref 65–99)
POTASSIUM: 3.9 mmol/L (ref 3.5–5.1)
SODIUM: 141 mmol/L (ref 135–145)

## 2015-05-09 LAB — CBC
HCT: 40.6 % (ref 39.0–52.0)
HEMOGLOBIN: 13 g/dL (ref 13.0–17.0)
MCH: 29.2 pg (ref 26.0–34.0)
MCHC: 32 g/dL (ref 30.0–36.0)
MCV: 91.2 fL (ref 78.0–100.0)
Platelets: 176 10*3/uL (ref 150–400)
RBC: 4.45 MIL/uL (ref 4.22–5.81)
RDW: 16.2 % — ABNORMAL HIGH (ref 11.5–15.5)
WBC: 4.1 10*3/uL (ref 4.0–10.5)

## 2015-05-09 LAB — BODY FLUID CELL COUNT WITH DIFFERENTIAL
EOS FL: 0 %
Lymphs, Fluid: 21 %
MONOCYTE-MACROPHAGE-SEROUS FLUID: 30 % — AB (ref 50–90)
Neutrophil Count, Fluid: 49 % — ABNORMAL HIGH (ref 0–25)
WBC FLUID: 95 uL (ref 0–1000)

## 2015-05-09 LAB — LACTATE DEHYDROGENASE, PLEURAL OR PERITONEAL FLUID: LD, Fluid: 68 U/L — ABNORMAL HIGH (ref 3–23)

## 2015-05-09 LAB — PROTEIN, BODY FLUID: TOTAL PROTEIN, FLUID: 1.4 g/dL

## 2015-05-09 LAB — GLUCOSE, SEROUS FLUID: GLUCOSE FL: 127 mg/dL

## 2015-05-09 LAB — GRAM STAIN

## 2015-05-09 LAB — MRSA PCR SCREENING: MRSA BY PCR: POSITIVE — AB

## 2015-05-09 MED ORDER — METHYLPREDNISOLONE SODIUM SUCC 125 MG IJ SOLR
60.0000 mg | Freq: Every day | INTRAMUSCULAR | Status: DC
  Administered 2015-05-09 – 2015-05-11 (×3): 60 mg via INTRAVENOUS
  Filled 2015-05-09 (×3): qty 2

## 2015-05-09 MED ORDER — PNEUMOCOCCAL VAC POLYVALENT 25 MCG/0.5ML IJ INJ
0.5000 mL | INJECTION | Freq: Once | INTRAMUSCULAR | Status: AC
  Administered 2015-05-13: 0.5 mL via INTRAMUSCULAR
  Filled 2015-05-09: qty 0.5

## 2015-05-09 MED ORDER — ALBUTEROL SULFATE (2.5 MG/3ML) 0.083% IN NEBU: 2.5000 mg | INHALATION_SOLUTION | RESPIRATORY_TRACT | Status: DC | PRN

## 2015-05-09 MED ORDER — LIDOCAINE HCL (PF) 1 % IJ SOLN
INTRAMUSCULAR | Status: AC
  Filled 2015-05-09: qty 10

## 2015-05-09 MED ORDER — METHADONE HCL 10 MG PO TABS
50.0000 mg | ORAL_TABLET | Freq: Every day | ORAL | Status: DC
  Administered 2015-05-09 – 2015-05-15 (×7): 50 mg via ORAL
  Filled 2015-05-09 (×7): qty 5

## 2015-05-09 MED ORDER — CETYLPYRIDINIUM CHLORIDE 0.05 % MT LIQD
7.0000 mL | Freq: Two times a day (BID) | OROMUCOSAL | Status: DC
  Administered 2015-05-09 – 2015-05-15 (×10): 7 mL via OROMUCOSAL

## 2015-05-09 MED ORDER — INFLUENZA VAC SPLIT QUAD 0.5 ML IM SUSY
0.5000 mL | PREFILLED_SYRINGE | Freq: Once | INTRAMUSCULAR | Status: AC
Start: 2015-05-09 — End: 2015-05-13
  Administered 2015-05-13: 0.5 mL via INTRAMUSCULAR
  Filled 2015-05-09: qty 0.5

## 2015-05-09 NOTE — Consult Note (Signed)
Reason for Consult:left pleural effusion  Referring Physician: CCM  Benjamin Bell is an 60 y.o. male.    recurrent left after traumatic hydropnemothorax after fall/rib fxs.Marland KitchenMarland KitchenCT CHEST 04/15/15      HPI: The patient is well-known to [facility] having  seen Dr. Roxan Hockey on February 23 with the below history noted. He has re-presented to the hospital wit increased SOB and  an enlarging left-sided effusion. We are asked to see in reconsultation for consideration of intervention. He primarily complains of shortness of breath. He denies cough or  sputum production. He denies fevers, chills or other constitutional symptoms except he does have some fatigue. He otherwise does not have any specific symptomatology complaints.  Benjamin Bell is a 60 year old man with a past medical history significant for polysubstance abuse, cirrhosis, treated hepatitis C, abdominal aortic aneurysm, pulmonary nodules, an adrenal mass, and a recent fall which left him hospitalized.  He was hospitalized in early January after falling on ice. He had multiple rib fractures. He initially had a left pneumothorax that did not require chest tube placement. He was discharged after a couple of weeks but readmitted with respiratory failure 2 days later. He had a left chest tube placed and 2 L of bloody fluid was evacuated. The chest tube ultimately was removed prior to discharge. He was sent home with home oxygen.  He recently saw [facility] for follow-up. A repeat chest x-ray showed partial reaccumulation of his left pleural effusion. An attempted ultrasound-guided thoracentesis was unsuccessful due to loculations. A CT was done on 04/15/2015 which showed multiple loculated fluid collections in the left chest.  He continues to have left-sided chest pain. He has noted a lot of drainage from his chest tube site. He says the drainage is serous. He has not noticed any blood and  his breathing is slowly improving. He is only  using oxygen occasionally now. He did not wear it to his office visit today.       Past Medical History  Diagnosis Date  . Chronic back pain   . Hypertension   . COPD (chronic obstructive pulmonary disease) ([place])   . ARF (acute renal failure) ([facility])     secondary to PNA  . AAA (abdominal aortic aneurysm) ([place])     History reviewed. No pertinent past surgical history.  No family history on file.  Social History:  reports that he has been smoking Cigarettes.  He has a 21.5 pack-year smoking history. He has never used smokeless tobacco. He reports that he does not drink alcohol or use illicit drugs.  Allergies: No Known Allergies  Medications:  Prior to Admission:  Prescriptions prior to admission  Medication Sig Dispense Refill Last Dose  . albuterol (PROVENTIL HFA;VENTOLIN HFA) 108 (90 Base) MCG/ACT inhaler Inhale 1-2 puffs into the lungs every 4 (four) hours as needed for wheezing or shortness of breath.   05/08/2015 at Unknown time  . amLODipine (NORVASC) 10 MG tablet Take 10 mg by mouth daily.   05/08/2015 at Unknown time  . aspirin 81 MG chewable tablet Chew 1 tablet (81 mg total) by mouth daily.   05/08/2015 at Unknown time  . ibuprofen (ADVIL,MOTRIN) 600 MG tablet Take 1 tablet (600 mg total) by mouth every 6 (six) hours as needed for moderate pain. 30 tablet 0 05/07/2015 at Unknown time  . lisinopril-hydrochlorothiazide (PRINZIDE,ZESTORETIC) 20-25 MG tablet Take 1 tablet by mouth daily.   05/08/2015 at Unknown time  . methadone (DOLOPHINE) 10 MG/5ML solution Take 35.5 mLs (71  mg total) by mouth daily. 120 mL 0 05/08/2015 at 0700  . mometasone-formoterol (DULERA) 100-5 MCG/ACT AERO Inhale 2 puffs into the lungs 2 (two) times daily. 1 Inhaler 6 05/07/2015 at Unknown time  . Multiple Vitamins-Minerals (MENS MULTI VITAMIN & MINERAL PO) Take 1 tablet by mouth daily.   05/08/2015 at Unknown time  . potassium chloride (KLOR-CON) 20 MEQ packet Take 20 mEq by mouth daily.   05/08/2015 at  Unknown time    Results for orders placed or performed during the hospital encounter of 05/08/15 (from the past 48 hour(s))  Comprehensive metabolic panel     Status: Abnormal   Collection Time: 05/08/15  3:12 PM  Result Value Ref Range   Sodium 136 135 - 145 mmol/L   Potassium 2.3 (LL) 3.5 - 5.1 mmol/L    Comment: CRITICAL RESULT CALLED TO, READ BACK BY AND VERIFIED WITH: M.BEASLY,RN 05/08/15 '@1555'$  BY V.WILKINS    Chloride 97 (L) 101 - 111 mmol/L   CO2 29 22 - 32 mmol/L   Glucose, Bld 137 (H) 65 - 99 mg/dL   BUN 28 (H) 6 - 20 mg/dL   Creatinine, Ser 1.39 (H) 0.61 - 1.24 mg/dL   Calcium 8.5 (L) 8.9 - 10.3 mg/dL   Total Protein 6.3 (L) 6.5 - 8.1 g/dL   Albumin 2.2 (L) 3.5 - 5.0 g/dL   AST 54 (H) 15 - 41 U/L   ALT 22 17 - 63 U/L   Alkaline Phosphatase 190 (H) 38 - 126 U/L   Total Bilirubin 0.8 0.3 - 1.2 mg/dL   GFR calc non Af Amer 54 (L) >60 mL/min   GFR calc Af Amer >60 >60 mL/min    Comment: (NOTE) The eGFR has been calculated using the CKD EPI equation. This calculation has not been validated in all clinical situations. eGFR's persistently <60 mL/min signify possible Chronic Kidney Disease.    Anion gap 10 5 - 15  CBC with Differential/Platelet     Status: Abnormal   Collection Time: 05/08/15  3:12 PM  Result Value Ref Range   WBC 6.7 4.0 - 10.5 K/uL   RBC 4.44 4.22 - 5.81 MIL/uL   Hemoglobin 13.1 13.0 - 17.0 g/dL   HCT 41.0 39.0 - 52.0 %   MCV 92.3 78.0 - 100.0 fL   MCH 29.5 26.0 - 34.0 pg   MCHC 32.0 30.0 - 36.0 g/dL   RDW 16.0 (H) 11.5 - 15.5 %   Platelets 189 150 - 400 K/uL   Neutrophils Relative % 78 %   Neutro Abs 5.2 1.7 - 7.7 K/uL   Lymphocytes Relative 13 %   Lymphs Abs 0.9 0.7 - 4.0 K/uL   Monocytes Relative 8 %   Monocytes Absolute 0.6 0.1 - 1.0 K/uL   Eosinophils Relative 1 %   Eosinophils Absolute 0.1 0.0 - 0.7 K/uL   Basophils Relative 0 %   Basophils Absolute 0.0 0.0 - 0.1 K/uL  Ammonia     Status: Abnormal   Collection Time: 05/08/15  3:12 PM   Result Value Ref Range   Ammonia 45 (H) 9 - 35 umol/L  Protime-INR     Status: Abnormal   Collection Time: 05/08/15  3:12 PM  Result Value Ref Range   Prothrombin Time 17.6 (H) 11.6 - 15.2 seconds   INR 1.44 0.00 - 1.49  Ethanol     Status: None   Collection Time: 05/08/15  3:12 PM  Result Value Ref Range   Alcohol, Ethyl (B) <5 <5  mg/dL    Comment:        LOWEST DETECTABLE LIMIT FOR SERUM ALCOHOL IS 5 mg/dL FOR MEDICAL PURPOSES ONLY   I-stat troponin, ED     Status: None   Collection Time: 05/08/15  3:34 PM  Result Value Ref Range   Troponin i, poc 0.00 0.00 - 0.08 ng/mL   Comment 3            Comment: Due to the release kinetics of cTnI, a negative result within the first hours of the onset of symptoms does not rule out myocardial infarction with certainty. If myocardial infarction is still suspected, repeat the test at appropriate intervals.   I-Stat CG4 Lactic Acid, ED     Status: Abnormal   Collection Time: 05/08/15  3:34 PM  Result Value Ref Range   Lactic Acid, Venous 2.15 (HH) 0.5 - 2.0 mmol/L   Comment NOTIFIED PHYSICIAN   Blood culture (routine x 2)     Status: None (Preliminary result)   Collection Time: 05/08/15  5:29 PM  Result Value Ref Range   Specimen Description BLOOD RIGHT FOREARM    Special Requests BOTTLES DRAWN AEROBIC AND ANAEROBIC 5CC    Culture NO GROWTH < 24 HOURS    Report Status PENDING   I-Stat CG4 Lactic Acid, ED     Status: None   Collection Time: 05/08/15  5:40 PM  Result Value Ref Range   Lactic Acid, Venous 1.08 0.5 - 2.0 mmol/L  I-Stat arterial blood gas, ED     Status: Abnormal   Collection Time: 05/08/15  6:05 PM  Result Value Ref Range   pH, Arterial 7.354 7.350 - 7.450   pCO2 arterial 55.2 (H) 35.0 - 45.0 mmHg   pO2, Arterial 61.0 (L) 80.0 - 100.0 mmHg   Bicarbonate 30.8 (H) 20.0 - 24.0 mEq/L   TCO2 32 0 - 100 mmol/L   O2 Saturation 89.0 %   Acid-Base Excess 4.0 (H) 0.0 - 2.0 mmol/L   Patient temperature 98.6 F     Collection site RADIAL, ALLEN'S TEST ACCEPTABLE    Drawn by RT    Sample type ARTERIAL   I-Stat arterial blood gas, ED     Status: Abnormal   Collection Time: 05/08/15  9:18 PM  Result Value Ref Range   pH, Arterial 7.380 7.350 - 7.450   pCO2 arterial 52.6 (H) 35.0 - 45.0 mmHg   pO2, Arterial 69.0 (L) 80.0 - 100.0 mmHg   Bicarbonate 31.1 (H) 20.0 - 24.0 mEq/L   TCO2 33 0 - 100 mmol/L   O2 Saturation 93.0 %   Acid-Base Excess 5.0 (H) 0.0 - 2.0 mmol/L   Patient temperature HIDE    Collection site RADIAL, ALLEN'S TEST ACCEPTABLE    Drawn by RT    Sample type ARTERIAL   CBC     Status: Abnormal   Collection Time: 05/08/15 10:04 PM  Result Value Ref Range   WBC 5.0 4.0 - 10.5 K/uL   RBC 4.14 (L) 4.22 - 5.81 MIL/uL   Hemoglobin 12.2 (L) 13.0 - 17.0 g/dL   HCT 38.0 (L) 39.0 - 52.0 %   MCV 91.8 78.0 - 100.0 fL   MCH 29.5 26.0 - 34.0 pg   MCHC 32.1 30.0 - 36.0 g/dL   RDW 15.9 (H) 11.5 - 15.5 %   Platelets 164 150 - 400 K/uL  Creatinine, serum     Status: Abnormal   Collection Time: 05/08/15 10:04 PM  Result Value Ref Range  Creatinine, Ser 1.28 (H) 0.61 - 1.24 mg/dL   GFR calc non Af Amer >60 >60 mL/min   GFR calc Af Amer >60 >60 mL/min    Comment: (NOTE) The eGFR has been calculated using the CKD EPI equation. This calculation has not been validated in all clinical situations. eGFR's persistently <60 mL/min signify possible Chronic Kidney Disease.   CBC     Status: Abnormal   Collection Time: 05/09/15  5:17 AM  Result Value Ref Range   WBC 4.1 4.0 - 10.5 K/uL   RBC 4.45 4.22 - 5.81 MIL/uL   Hemoglobin 13.0 13.0 - 17.0 g/dL   HCT 40.6 39.0 - 52.0 %   MCV 91.2 78.0 - 100.0 fL   MCH 29.2 26.0 - 34.0 pg   MCHC 32.0 30.0 - 36.0 g/dL   RDW 16.2 (H) 11.5 - 15.5 %   Platelets 176 150 - 400 K/uL  Basic metabolic panel     Status: Abnormal   Collection Time: 05/09/15  5:17 AM  Result Value Ref Range   Sodium 141 135 - 145 mmol/L   Potassium 3.9 3.5 - 5.1 mmol/L    Comment:  DELTA CHECK NOTED   Chloride 102 101 - 111 mmol/L   CO2 28 22 - 32 mmol/L   Glucose, Bld 121 (H) 65 - 99 mg/dL   BUN 29 (H) 6 - 20 mg/dL   Creatinine, Ser 1.19 0.61 - 1.24 mg/dL   Calcium 8.1 (L) 8.9 - 10.3 mg/dL   GFR calc non Af Amer >60 >60 mL/min   GFR calc Af Amer >60 >60 mL/min    Comment: (NOTE) The eGFR has been calculated using the CKD EPI equation. This calculation has not been validated in all clinical situations. eGFR's persistently <60 mL/min signify possible Chronic Kidney Disease.    Anion gap 11 5 - 15  MRSA PCR Screening     Status: Abnormal   Collection Time: 05/09/15  7:38 AM  Result Value Ref Range   MRSA by PCR POSITIVE (A) NEGATIVE    Comment:        The GeneXpert MRSA Assay (FDA approved for NASAL specimens only), is one component of a comprehensive MRSA colonization surveillance program. It is not intended to diagnose MRSA infection nor to guide or monitor treatment for MRSA infections. RESULT CALLED TO, READ BACK BY AND VERIFIED WITH: A. MILLS RN 05/09/15 AT 0929 BY A. DAVIS     Dg Chest 2 View  05/08/2015  CLINICAL DATA:  Shortness of breath EXAM: CHEST  2 VIEW COMPARISON:  04/13/2015 FINDINGS: Interval progression of left pleural effusion which is now moderate to large in character. Right lung appears hyperexpanded. Cardiopericardial silhouette is obscured. Interstitial markings are diffusely coarsened with chronic features. Multiple nonacute posterior left rib fractures noted. IMPRESSION: Interval progression of left pleural effusion, now moderate to large in size. Electronically Signed   By: Misty Stanley M.D.   On: 05/08/2015 15:49   Dg Cervical Spine Complete  05/08/2015  CLINICAL DATA:  Recent left-sided rib fracture. Progressive shortness of breath for 2 hours. EXAM: CERVICAL SPINE - COMPLETE 4+ VIEW COMPARISON:  None. FINDINGS: Mild reversal of normal lordosis is identified. Minimal anterolisthesis of C2 versus C3 and C3 versus C4 is stable. The  pre odontoid space and prevertebral soft tissues are normal. No fractures. The neural foramina are patent. Dedicated views of the odontoid process are normal. The lateral masses of C1 align with C2. The cervical spine is only seen to the  bottom of C6 on the lateral view. Chronic calcifications are identified in the neck. IMPRESSION: 1. Limited study only seen to the bottom of C6 on the lateral view. No interval change or acute abnormality identified. 2. Carotid calcifications. Electronically Signed   By: Dorise Bullion III M.D   On: 05/08/2015 15:53    Review of Systems  Constitutional: Positive for malaise/fatigue. Negative for fever, chills, weight loss and diaphoresis.  HENT: Negative for congestion, hearing loss and sore throat.   Eyes: Negative for blurred vision and double vision.  Respiratory: Positive for shortness of breath. Negative for cough, hemoptysis, sputum production and wheezing.   Cardiovascular: Positive for orthopnea. Negative for chest pain, palpitations, leg swelling and PND.  Gastrointestinal: Negative for heartburn, nausea, vomiting, diarrhea and constipation.  Genitourinary: Negative for dysuria.  Musculoskeletal: Positive for back pain.  Skin: Negative for itching and rash.  Neurological: Negative for dizziness, focal weakness, weakness and headaches.  Psychiatric/Behavioral: Negative for depression.   Blood pressure 107/80, pulse 86, temperature 97.8 F (36.6 C), temperature source Oral, resp. rate 7, height '5\' 11"'$  (1.803 m), weight 163 lb 12.8 oz (74.3 kg), SpO2 93 %. Physical Exam  Constitutional: He is oriented to person, place, and time. He appears well-developed. No distress.  HENT:  Head: Normocephalic and atraumatic.  Mouth/Throat: Oropharynx is clear and moist. No oropharyngeal exudate.  Eyes: Conjunctivae and EOM are normal. Pupils are equal, round, and reactive to light. Right eye exhibits no discharge. No scleral icterus.  Neck: Normal range of motion.  No JVD present. No tracheal deviation present. No thyromegaly present.  Cardiovascular: Normal rate, regular rhythm and normal heart sounds.  Exam reveals no friction rub.   No murmur heard. Respiratory: No stridor. No respiratory distress. He has no wheezes. He has no rales. He exhibits no tenderness.  Dim on left  GI: He exhibits distension. There is no rebound and no guarding.  Periumbilical hernia  Musculoskeletal: He exhibits no edema or tenderness.  Lymphadenopathy:    He has cervical adenopathy.  Neurological: He is alert and oriented to person, place, and time.  Skin: Skin is warm and dry. He is not diaphoretic.  Psychiatric: He has a normal mood and affect.    Assessment/Plan:   Recurrent left pleural effusion of unclear etiology. Recommended that he undergo  undergo a thoracentesis for relief of immediate sympotoms then consider VTAS vs pleurix for diagnosis and long term treatment of recurrent effusion .  Grace Isaac MD      [facility] [place] [address] Office [phone]   [place]

## 2015-05-09 NOTE — Procedures (Signed)
   US guided Left thoracentesis  1.6 liters cloudy yellow fluid  Sent for labs per MD Tolerated well  cxr pending

## 2015-05-09 NOTE — Progress Notes (Signed)
[place] MD to bedside, wants to keep oxygen sats between 88 and 92, decreased [place] oxygen from 4L to 2L.  Will continue to monitor closely.

## 2015-05-09 NOTE — Progress Notes (Addendum)
Patient ID: Benjamin Bell, male   DOB: April 06, 1955, 60 y.o.   MRN: [id number]      [address]       [name] [address]             [phone]                      LOS: 1 day   Subjective: Feels better after 1.6 liters drained   Objective: Vital signs in last 24 hours: Patient Vitals for the past 24 hrs:  BP Temp Temp src Pulse Resp SpO2 Height Weight  05/09/15 1409 - - - - - 95 % - -  05/09/15 0924 - - - - - 93 % - -  05/09/15 0729 107/80 mmHg 97.8 F (36.6 C) Oral 86 (!) 7 93 % - -  05/09/15 0329 113/72 mmHg 98 F (36.7 C) Oral 81 15 93 % - 163 lb 12.8 oz (74.3 kg)  05/09/15 0135 - - - - - 90 % - -  05/09/15 0125 - - - - - 96 % - -  05/09/15 0011 133/70 mmHg 97.7 F (36.5 C) Oral 76 15 93 %  (1.803 m) 163 lb 12.8 oz (74.3 kg)  05/08/15 2245 115/73 mmHg - - 88 (!) 8 94 % - -  05/08/15 2200 123/73 mmHg - - 92 12 94 % - -  05/08/15 2145 113/69 mmHg - - 91 (!) 9 93 % - -  05/08/15 2115 106/71 mmHg - - 89 (!) 9 94 % - -  05/08/15 2100 111/71 mmHg - - 88 (!) 8 92 % - -  05/08/15 2045 108/68 mmHg - - 90 (!) 9 93 % - -  05/08/15 1945 113/68 mmHg - - 84 19 93 % - -  05/08/15 1930 108/69 mmHg - - 87 18 93 % - -  05/08/15 1915 119/69 mmHg - - 88 13 92 % - -  05/08/15 1830 113/72 mmHg - - 93 19 94 % - -  05/08/15 1800 119/70 mmHg - - 95 11 93 % - -  05/08/15 1730 108/73 mmHg - - 95 15 91 % - -  05/08/15 1700 112/70 mmHg - - 90 20 96 % - -  05/08/15 1615 104/75 mmHg - - 95 18 97 % - -  05/08/15 1545 136/82 mmHg - - 102 17 95 % - -    Filed Weights   05/08/15 1410 05/09/15 0011 05/09/15 0329  Weight: 185 lb (83.915 kg) 163 lb 12.8 oz (74.3 kg) 163 lb 12.8 oz (74.3 kg)    Hemodynamic parameters for last 24 hours:    Intake/Output from previous day: 03/11 0701 - 03/12 0700 In: 2224.8 [I.V.:1374.8; IV Piggyback:850] Out: -  Intake/Output this shift: Total I/O In: -  Out: 500 [Urine:500]  Scheduled Meds: . antiseptic oral rinse  7 mL Mouth Rinse BID   . heparin  5,000 Units Subcutaneous 3 times per day  . Influenza vac split quadrivalent PF  0.5 mL Intramuscular Once  . ipratropium-albuterol  3 mL Nebulization Q6H  . lidocaine (PF)      . methadone  50 mg Oral Daily  . methylPREDNISolone (SOLU-MEDROL) injection  60 mg Intravenous Daily  . mometasone-formoterol  2 puff Inhalation BID  . piperacillin-tazobactam (ZOSYN)  IV  3.375 g Intravenous Q8H  . pneumococcal 23 valent vaccine  0.5 mL Intramuscular Once  . vancomycin  1,000 mg Intravenous Q12H   Continuous Infusions:  PRN Meds:.sodium chloride, albuterol    Lab Results: CBC: Recent Labs  05/08/15 2204 05/09/15 0517  WBC 5.0 4.1  HGB 12.2* 13.0  HCT 38.0* 40.6  PLT 164 176   BMET:  Recent Labs  05/08/15 1512 05/08/15 2204 05/09/15 0517  NA 136  --  141  K 2.3*  --  3.9  CL 97*  --  102  CO2 29  --  28  GLUCOSE 137*  --  121*  BUN 28*  --  29*  CREATININE 1.39* 1.28* 1.19  CALCIUM 8.5*  --  8.1*    PT/INR:  Recent Labs  05/08/15 1512  LABPROT 17.6*  INR 1.44     Radiology Dg Chest 1 View  05/09/2015  CLINICAL DATA:  Pleural effusion, post left thoracentesis EXAM: CHEST 1 VIEW COMPARISON:  05/08/2015 FINDINGS: Slight decrease in the large left pleural effusion following thoracentesis. No pneumothorax. Left lower lobe atelectasis or infiltrate. Heart is likely mildly enlarged. No confluent opacity on the right. IMPRESSION: Slight decreased size of the large left pleural effusion following thoracentesis. No pneumothorax. Electronically Signed   By: Charlett Nose M.D.   On: 05/09/2015 12:18   Dg Chest 2 View  05/08/2015  CLINICAL DATA:  Shortness of breath EXAM: CHEST  2 VIEW COMPARISON:  04/13/2015 FINDINGS: Interval progression of left pleural effusion which is now moderate to large in character. Right lung appears hyperexpanded. Cardiopericardial silhouette is obscured. Interstitial markings are diffusely coarsened with chronic features. Multiple nonacute  posterior left rib fractures noted. IMPRESSION: Interval progression of left pleural effusion, now moderate to large in size. Electronically Signed   By: Kennith Center M.D.   On: 05/08/2015 15:49   Dg Cervical Spine Complete  05/08/2015  CLINICAL DATA:  Recent left-sided rib fracture. Progressive shortness of breath for 2 hours. EXAM: CERVICAL SPINE - COMPLETE 4+ VIEW COMPARISON:  None. FINDINGS: Mild reversal of normal lordosis is identified. Minimal anterolisthesis of C2 versus C3 and C3 versus C4 is stable. The pre odontoid space and prevertebral soft tissues are normal. No fractures. The neural foramina are patent. Dedicated views of the odontoid process are normal. The lateral masses of C1 align with C2. The cervical spine is only seen to the bottom of C6 on the lateral view. Chronic calcifications are identified in the neck. IMPRESSION: 1. Limited study only seen to the bottom of C6 on the lateral view. No interval change or acute abnormality identified. 2. Carotid calcifications. Electronically Signed   By: Gerome Sam III M.D   On: 05/08/2015 15:53   US Thoracentesis Asp Pleural Space W/img Guide  05/09/2015  INDICATION: Symptomatic left sided pleural effusion EXAM: US THORACENTESIS ASP PLEURAL SPACE W/IMG GUIDE COMPARISON:  None. MEDICATIONS: 10 cc 1% lidocaine COMPLICATIONS: None immediate. TECHNIQUE: Informed written consent was obtained from the patient after a discussion of the risks, benefits and alternatives to treatment. A timeout was performed prior to the initiation of the procedure. Initial ultrasound scanning demonstrates a left pleural effusion. The lower chest was prepped and draped in the usual sterile fashion. 1% lidocaine was used for local anesthesia. Under direct ultrasound guidance, a 19 gauge, 7-cm, Yueh catheter was introduced. An ultrasound image was saved for documentation purposes. The thoracentesis was performed. The catheter was removed and a dressing was applied. The  patient tolerated the procedure well without immediate post procedural complication. The patient was escorted to have an upright chest radiograph. FINDINGS: A total of approximately 1.6 liters of cloudy yellow fluid was  removed. Requested samples were sent to the laboratory. IMPRESSION: Successful ultrasound-guided Left sided thoracentesis yielding 1.6 liters of pleural fluid. Read by: Robet LeuPamela A [facility] HospitalAC Electronically Signed   By: Malachy MoanHeath  McCullough M.D.   On: 05/09/2015 12:01     Assessment/Plan: S/P  Thoracentesis Feels better after thoracentesis but still residuial fluid.Will discuss with Dr Dorris Fetchhendrickson, VATS vs pleurix  He will see patient in am  Delight OvensEdward B Lezli Danek MD  05/09/2015 2:18 PM

## 2015-05-09 NOTE — H&P (Signed)
PULMONARY / CRITICAL CARE MEDICINE   Name: Benjamin Bell MRN: [id number] DOB: [birth year]    ADMISSION DATE:  05/08/2015 CONSULTATION DATE:  05/08/15  REFERRING MD:  ED physician  CHIEF COMPLAINT:  Shortness of breath  HISTORY OF PRESENT ILLNESS:   Mr. Benjamin Bell is a 60 y/o man with past medical history significant for COPD and recurrent left sided pleural effusion.  He is followed by Dr. Kendrick FriesMcQuaid for this.  [facility] after calling and complaining of increasing shortness of breath.  He was noted to have sats of 68% on RA.  He was given albuterol, atrovent and solumedrol by EMS.  A chest x-ray done in the ED showed increasing right sided pleural effusion.   PAST MEDICAL HISTORY :  He  has a past medical history of Chronic back pain; Hypertension; COPD (chronic obstructive pulmonary disease) (HCC); ARF (acute renal failure) (HCC); and AAA (abdominal aortic aneurysm) (HCC).  PAST SURGICAL HISTORY: He  has no past surgical history on file.  No Known Allergies  No current facility-administered medications on file prior to encounter.   Current Outpatient Prescriptions on File Prior to Encounter  Medication Sig  . albuterol (PROVENTIL HFA;VENTOLIN HFA) 108 (90 Base) MCG/ACT inhaler Inhale 1-2 puffs into the lungs every 4 (four) hours as needed for wheezing or shortness of breath.  Marland Kitchen. amLODipine (NORVASC) 10 MG tablet Take 10 mg by mouth daily.  Marland Kitchen. aspirin 81 MG chewable tablet Chew 1 tablet (81 mg total) by mouth daily.  Marland Kitchen. ibuprofen (ADVIL,MOTRIN) 600 MG tablet Take 1 tablet (600 mg total) by mouth every 6 (six) hours as needed for moderate pain.  Marland Kitchen. lisinopril-hydrochlorothiazide (PRINZIDE,ZESTORETIC) 20-25 MG tablet Take 1 tablet by mouth daily.  . methadone (DOLOPHINE) 10 MG/5ML solution Take 35.5 mLs (71 mg total) by mouth daily.  . mometasone-formoterol (DULERA) 100-5 MCG/ACT AERO Inhale 2 puffs into the lungs 2 (two) times daily.  . potassium chloride (KLOR-CON) 20 MEQ  packet Take 20 mEq by mouth daily.    FAMILY HISTORY:  His has no family status information on file.   SOCIAL HISTORY: He  reports that he has been smoking Cigarettes.  He has a 21.5 pack-year smoking history. He has never used smokeless tobacco. He reports that he does not drink alcohol or use illicit drugs.  REVIEW OF SYSTEMS:   A review of 14 systems was negative except as stated in the HPI.   SUBJECTIVE:  60 y/o with COPD exacerbation  VITAL SIGNS: BP 113/72 mmHg  Pulse 81  Temp(Src) 98 F (36.7 C) (Oral)  Resp 15  Ht 5\' 11"  (1.803 m)  Wt 74.3 kg (163 lb 12.8 oz)  BMI 22.86 kg/m2  SpO2 93%  HEMODYNAMICS:    VENTILATOR SETTINGS:    INTAKE / OUTPUT: I/O last 3 completed shifts: In: 1600 [I.V.:1350; IV Piggyback:250] Out: -   PHYSICAL EXAMINATION: General:  Laying on Doctor, general practicestretcher, awakens to voice Neuro:  Awakens to voice, not answering questions.  HEENT:  PERRL, EOMI, OP clear Cardiovascular:  NRRR, no MRG Lungs:  Diffuse wheezes, very prolonged expiratory phase, diminished breath sounds on right  Abdomen:  Soft, NTND, +BS, no palpable HSM Musculoskeletal:  No joint abnormalities, 2+ edema from mid shin to ankles Skin:  No rashes, bruises or other lesions.   LABS:  BMET  Recent Labs Lab 05/08/15 1512 05/08/15 2204  NA 136  --   K 2.3*  --   CL 97*  --   CO2  29  --   BUN 28*  --   CREATININE 1.39* 1.28*  GLUCOSE 137*  --     Electrolytes  Recent Labs Lab 05/08/15 1512  CALCIUM 8.5*    CBC  Recent Labs Lab 05/08/15 1512 05/08/15 2204  WBC 6.7 5.0  HGB 13.1 12.2*  HCT 41.0 38.0*  PLT 189 164    Coag's  Recent Labs Lab 05/08/15 1512  INR 1.44    Sepsis Markers  Recent Labs Lab 05/08/15 1534 05/08/15 1740  LATICACIDVEN 2.15* 1.08    ABG  Recent Labs Lab 05/08/15 1805 05/08/15 2118  PHART 7.354 7.380  PCO2ART 55.2* 52.6*  PO2ART 61.0* 69.0*    Liver Enzymes  Recent Labs Lab 05/08/15 1512  AST 54*  ALT 22   ALKPHOS 190*  BILITOT 0.8  ALBUMIN 2.2*    Cardiac Enzymes No results for input(s): TROPONINI, PROBNP in the last 168 hours.  Glucose No results for input(s): GLUCAP in the last 168 hours.  Imaging Dg Chest 2 View  05/08/2015  CLINICAL DATA:  Shortness of breath EXAM: CHEST  2 VIEW COMPARISON:  04/13/2015 FINDINGS: Interval progression of left pleural effusion which is now moderate to large in character. Right lung appears hyperexpanded. Cardiopericardial silhouette is obscured. Interstitial markings are diffusely coarsened with chronic features. Multiple nonacute posterior left rib fractures noted. IMPRESSION: Interval progression of left pleural effusion, now moderate to large in size. Electronically Signed   By: Kennith Center M.D.   On: 05/08/2015 15:49   Dg Cervical Spine Complete  05/08/2015  CLINICAL DATA:  Recent left-sided rib fracture. Progressive shortness of breath for 2 hours. EXAM: CERVICAL SPINE - COMPLETE 4+ VIEW COMPARISON:  None. FINDINGS: Mild reversal of normal lordosis is identified. Minimal anterolisthesis of C2 versus C3 and C3 versus C4 is stable. The pre odontoid space and prevertebral soft tissues are normal. No fractures. The neural foramina are patent. Dedicated views of the odontoid process are normal. The lateral masses of C1 align with C2. The cervical spine is only seen to the bottom of C6 on the lateral view. Chronic calcifications are identified in the neck. IMPRESSION: 1. Limited study only seen to the bottom of C6 on the lateral view. No interval change or acute abnormality identified. 2. Carotid calcifications. Electronically Signed   By: Gerome Sam III M.D   On: 05/08/2015 15:53     STUDIES:  CXR 3/11 - increasing right pleural effusion  CULTURES: Blood  ANTIBIOTICS: Vancomycin 3/11 >>> Zosyn 3/11 >>>  SIGNIFICANT EVENTS:   LINES/TUBES:   DISCUSSION: 60 y/o with COPD exacerbation and recurrent right pleural effusion. Will likely need  chest tube with possible pleuradesis.   ASSESSMENT / PLAN:  PULMONARY A: COPD exacerbation, right sided pleural effusion P:   Nebs, solumedrol /day, maintain sats between 88 and 92% Follow effusion with diuresis Will likely need chest tube vs. Thoracentesis.   CARDIOVASCULAR A:  HTN, edema P:  Holding home meds for now Will need to be diureses however was quite hypokalemic on admission so holding lasix until potassium repleted.   RENAL A:   hypokalemia P:   Replete potassium   INFECTIOUS A:   Pleural effusion P:   Treating with broad spectrum abx.  Can d/c when blood cx negative x 48h.     FAMILY  - Updates:   - Inter-disciplinary family meet or Palliative Care meeting due by:  day 7    Pulmonary and Critical Care Medicine [facility] Pager: [phone]  05/09/2015, 5:48 AM

## 2015-05-09 NOTE — H&P (Signed)
PULMONARY / CRITICAL CARE MEDICINE   Name: Benjamin Bell MRN: [id number] DOB: 1955/11/21    ADMISSION DATE:  05/08/2015 CONSULTATION DATE:  05/08/15  REFERRING MD:  ED physician  CHIEF COMPLAINT:  Shortness of breath  HISTORY OF PRESENT ILLNESS:   Mr. Benjamin Bell is a 60 y/o man with past medical history significant for COPD, cirrhosis and recurrent left sided pleural effusion.  He is followed by Dr. Kendrick Bell for this.  Hosp January, 2017 for traumatic left hemopneumothorax with rib fractures. After chest tube removal, he continued drainage from chest tube site, described as projectile across room if he coughed. Since site finally sealed over, he has noted gradually increasing dyspnea.  He was brought to the [facility] ED via EMS after calling and complaining of increasing shortness of breath.  He was noted to have sats of 68% on RA.  He was given albuterol, atrovent and solumedrol by EMS.  A chest x-ray done in the ED showed increasing left sided pleural effusion. States he no longer drinks ETOH. 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  PAST MEDICAL HISTORY :  He  has a past medical history of Chronic back pain; Hypertension; COPD (chronic obstructive pulmonary disease) (HCC); ARF (acute renal failure) (HCC); and AAA (abdominal aortic aneurysm) (HCC). Hepatic cirrhosis  PAST SURGICAL HISTORY: He  has no past surgical history on file.  No Known Allergies  No current facility-administered medications on file prior to encounter.   Current Outpatient Prescriptions on File Prior to Encounter  Medication Sig  . albuterol (PROVENTIL HFA;VENTOLIN HFA) 108 (90 Base) MCG/ACT inhaler Inhale 1-2 puffs into the lungs every 4 (four) hours as needed for wheezing or shortness of breath.  Marland Kitchen amLODipine (NORVASC) 10 MG tablet Take 10 mg by mouth daily.  Marland Kitchen aspirin 81 MG chewable tablet Chew 1 tablet (81 mg total) by mouth daily.   Marland Kitchen ibuprofen (ADVIL,MOTRIN) 600 MG tablet Take 1 tablet (600 mg total) by mouth every 6 (six) hours as needed for moderate pain.  Marland Kitchen lisinopril-hydrochlorothiazide (PRINZIDE,ZESTORETIC) 20-25 MG tablet Take 1 tablet by mouth daily.  . methadone (DOLOPHINE) 10 MG/5ML solution Take 35.5 mLs (71 mg total) by mouth daily.  . mometasone-formoterol (DULERA) 100-5 MCG/ACT AERO Inhale 2 puffs into the lungs 2 (two) times daily.  . potassium chloride (KLOR-CON) 20 MEQ packet Take 20 mEq by mouth daily.    FAMILY HISTORY:  His has no family status information on file.   SOCIAL HISTORY: He  reports that he has been smoking Cigarettes.  He has a 21.5 pack-year smoking history. He has never used smokeless tobacco. He reports that he does not drink alcohol or use illicit drugs.  REVIEW OF SYSTEMS:   A review of 14 systems was negative except as stated in the HPI.   SUBJECTIVE:  Denies chest pain. Chronic stable abdominal protuberance  VITAL SIGNS: BP 107/80 mmHg  Pulse 86  Temp(Src) 97.8 F (36.6 C) (Oral)  Resp 7  Ht  (1.803 m)  Wt 74.3 kg (163 lb 12.8 oz)  BMI 22.86 kg/m2  SpO2 93%  HEMODYNAMICS:    VENTILATOR SETTINGS:    INTAKE / OUTPUT: I/O last 3 completed shifts: In: 2224.8 [I.V.:1374.8; IV Piggyback:850] Out: -   PHYSICAL EXAMINATION: General: Awake, polite, cooperative, NAD Neuro:  Oriented x 3, gross vision and hearing intact, moves all extremities, non-lateralizing, no tremor  HEENT:  PERRL, EOMI, OP clear  Cardiovascular:  NRRR, no MRG Lungs:  Unlabored on O2,  prolonged expiratory phase, diminished breath sounds on left, dull, no rub Abdomen:  Softly distended, NTND, +BS, no palpable HSM, question ascites Musculoskeletal:  No joint abnormalities, 2+ edema from mid shin to ankles Skin:  No rashes, bruises or other lesions.   LABS:  BMET  Recent Labs Lab 05/08/15 1512 05/08/15 2204 05/09/15 0517  NA 136  --  141  K 2.3*  --  3.9  CL 97*  --  102  CO2  29  --  28  BUN 28*  --  29*  CREATININE 1.39* 1.28* 1.19  GLUCOSE 137*  --  121*    Electrolytes  Recent Labs Lab 05/08/15 1512 05/09/15 0517  CALCIUM 8.5* 8.1*    CBC  Recent Labs Lab 05/08/15 1512 05/08/15 2204 05/09/15 0517  WBC 6.7 5.0 4.1  HGB 13.1 12.2* 13.0  HCT 41.0 38.0* 40.6  PLT 189 164 176    Coag's  Recent Labs Lab 05/08/15 1512  INR 1.44    Sepsis Markers  Recent Labs Lab 05/08/15 1534 05/08/15 1740  LATICACIDVEN 2.15* 1.08    ABG  Recent Labs Lab 05/08/15 1805 05/08/15 2118  PHART 7.354 7.380  PCO2ART 55.2* 52.6*  PO2ART 61.0* 69.0*    Liver Enzymes  Recent Labs Lab 05/08/15 1512  AST 54*  ALT 22  ALKPHOS 190*  BILITOT 0.8  ALBUMIN 2.2*    Cardiac Enzymes No results for input(s): TROPONINI, PROBNP in the last 168 hours.  Glucose No results for input(s): GLUCAP in the last 168 hours.  Imaging Dg Chest 2 View  05/08/2015  CLINICAL DATA:  Shortness of breath EXAM: CHEST  2 VIEW COMPARISON:  04/13/2015 FINDINGS: Interval progression of left pleural effusion which is now moderate to large in character. Right lung appears hyperexpanded. Cardiopericardial silhouette is obscured. Interstitial markings are diffusely coarsened with chronic features. Multiple nonacute posterior left rib fractures noted. IMPRESSION: Interval progression of left pleural effusion, now moderate to large in size. Electronically Signed   By: Kennith CenterEric  Bell M.D.   On: 05/08/2015 15:49   Dg Cervical Spine Complete  05/08/2015  CLINICAL DATA:  Recent left-sided rib fracture. Progressive shortness of breath for 2 hours. EXAM: CERVICAL SPINE - COMPLETE 4+ VIEW COMPARISON:  None. FINDINGS: Mild reversal of normal lordosis is identified. Minimal anterolisthesis of C2 versus C3 and C3 versus C4 is stable. The pre odontoid space and prevertebral soft tissues are normal. No fractures. The neural foramina are patent. Dedicated views of the odontoid process are normal.  The lateral masses of C1 align with C2. The cervical spine is only seen to the bottom of C6 on the lateral view. Chronic calcifications are identified in the neck. IMPRESSION: 1. Limited study only seen to the bottom of C6 on the lateral view. No interval change or acute abnormality identified. 2. Carotid calcifications. Electronically Signed   By: Gerome Samavid  Williams III M.D   On: 05/08/2015 15:53     STUDIES:  CXR 3/11 - increasing right pleural effusion  CULTURES: Blood  ANTIBIOTICS: Vancomycin 3/11 >>> Zosyn 3/11 >>>  SIGNIFICANT EVENTS:   LINES/TUBES:   DISCUSSION: 60 y/o with COPD exacerbation and recurrent right pleural effusion. Will likely need chest tube with possible pleuradesis.   ASSESSMENT / PLAN:  PULMONARY A: COPD exacerbation, Pleural effusion. Hx traumatic L hydropneumothorax Jan, 2017 with continued drainage until chest tube site healed over. Question how much may be ascites fluid. P:   Nebs,  solumedrol /day, maintain sats between 88 and 92% Follow effusion with diuresis Will likely need chest tube/ pleurodesis vs. Thoracentesis. Thoracic Surgery consulted Thoracic surgery consulted to consider chest tube, ? Pleurodesis. Ultrasound guided thoracentesis- cytology, LDH, cell count and diff, partial drainage for relief of dyspnea  CARDIOVASCULAR A:  HTN, edema P:  Holding home meds for now Will need to be diureses however was quite hypokalemic on admission so holding lasix until potassium repleted.   RENAL A:   hypokalemia P:   Replete potassium   INFECTIOUS A:   Pleural effusion.  P:   Treated with broad spectrum abx.  Can d/c when blood cx negative x 48h. Doubt infection.   Methadone maintenance, had recently called our office seeking oxycodone for neck pain Reports daily maintenance through [facility] [place] P: Resume stated maintenance dose 51 mg po once daily  Hepatic cirrhosis Previous ETOH abuse Transaminitis P- follow LFTs  intermittently   FAMILY  - Updates: Discussed w patient and wife in room  - Inter-disciplinary family meet or Palliative Care meeting due by:  day 7   CD Jaquelin Meaney, MD Pulmonary and Critical Care Medicine [facility] Pager: [phone] After 300 pm [phone]  05/09/2015, 9:55 AM

## 2015-05-10 DIAGNOSIS — J90 Pleural effusion, not elsewhere classified: Secondary | ICD-10-CM

## 2015-05-10 LAB — COMPREHENSIVE METABOLIC PANEL
ALT: 20 U/L (ref 17–63)
AST: 53 U/L — ABNORMAL HIGH (ref 15–41)
Albumin: 1.8 g/dL — ABNORMAL LOW (ref 3.5–5.0)
Alkaline Phosphatase: 151 U/L — ABNORMAL HIGH (ref 38–126)
Anion gap: 8 (ref 5–15)
BUN: 30 mg/dL — ABNORMAL HIGH (ref 6–20)
CO2: 29 mmol/L (ref 22–32)
Calcium: 7.9 mg/dL — ABNORMAL LOW (ref 8.9–10.3)
Chloride: 103 mmol/L (ref 101–111)
Creatinine, Ser: 1.22 mg/dL (ref 0.61–1.24)
GFR calc Af Amer: >60 mL/min (ref >60)
GFR calc non Af Amer: >60 mL/min (ref >60)
Glucose, Bld: 95 mg/dL (ref 65–99)
Potassium: 3 mmol/L — ABNORMAL LOW (ref 3.5–5.1)
Sodium: 140 mmol/L (ref 135–145)
Total Bilirubin: 1.1 mg/dL (ref 0.3–1.2)
Total Protein: 5.9 g/dL — ABNORMAL LOW (ref 6.5–8.1)

## 2015-05-10 LAB — CBC
HCT: 41.2 % (ref 39.0–52.0)
Hemoglobin: 12.9 g/dL — ABNORMAL LOW (ref 13.0–17.0)
MCH: 28.9 pg (ref 26.0–34.0)
MCHC: 31.3 g/dL (ref 30.0–36.0)
MCV: 92.4 fL (ref 78.0–100.0)
Platelets: 186 10*3/uL (ref 150–400)
RBC: 4.46 MIL/uL (ref 4.22–5.81)
RDW: 16.3 % — ABNORMAL HIGH (ref 11.5–15.5)
WBC: 5.9 10*3/uL (ref 4.0–10.5)

## 2015-05-10 LAB — PROTIME-INR
INR: 1.42 (ref 0.00–1.49)
Prothrombin Time: 17.4 seconds — ABNORMAL HIGH (ref 11.6–15.2)

## 2015-05-10 LAB — TYPE AND SCREEN
ABO/RH(D): O POS
Antibody Screen: NEGATIVE

## 2015-05-10 MED ORDER — POTASSIUM CHLORIDE CRYS ER 20 MEQ PO TBCR
40.0000 meq | EXTENDED_RELEASE_TABLET | Freq: Three times a day (TID) | ORAL | Status: AC
  Administered 2015-05-10 (×2): 40 meq via ORAL
  Filled 2015-05-10 (×2): qty 2

## 2015-05-10 MED ORDER — CHLORHEXIDINE GLUCONATE CLOTH 2 % EX PADS
6.0000 | MEDICATED_PAD | Freq: Every day | CUTANEOUS | Status: AC
  Administered 2015-05-11 – 2015-05-15 (×5): 6 via TOPICAL

## 2015-05-10 MED ORDER — FUROSEMIDE 40 MG PO TABS
40.0000 mg | ORAL_TABLET | Freq: Two times a day (BID) | ORAL | Status: DC
  Administered 2015-05-10 – 2015-05-15 (×10): 40 mg via ORAL
  Filled 2015-05-10 (×10): qty 1

## 2015-05-10 MED ORDER — POTASSIUM CHLORIDE 10 MEQ/100ML IV SOLN
10.0000 meq | INTRAVENOUS | Status: DC
  Administered 2015-05-10: 10 meq via INTRAVENOUS
  Filled 2015-05-10: qty 100

## 2015-05-10 MED ORDER — SPIRONOLACTONE 25 MG PO TABS
12.5000 mg | ORAL_TABLET | Freq: Two times a day (BID) | ORAL | Status: DC
  Administered 2015-05-10 – 2015-05-11 (×3): 12.5 mg via ORAL
  Filled 2015-05-10 (×3): qty 1

## 2015-05-10 MED ORDER — MUPIROCIN 2 % EX OINT
1.0000 "application " | TOPICAL_OINTMENT | Freq: Two times a day (BID) | CUTANEOUS | Status: AC
  Administered 2015-05-10 – 2015-05-15 (×10): 1 via NASAL
  Filled 2015-05-10 (×2): qty 22

## 2015-05-10 NOTE — Progress Notes (Signed)
  Subjective: Feels better after thoracentesis  Objective: Vital signs in last 24 hours: Temp:  [97.6 F (36.4 C)-98.7 F (37.1 C)] 97.6 F (36.4 C) (03/13 1550) Pulse Rate:  [84-98] 94 (03/13 1550) Cardiac Rhythm:  [-] Normal sinus rhythm (03/13 0805) Resp:  [13-31] 13 (03/13 1550) BP: (107-132)/(63-78) 131/63 mmHg (03/13 1550) SpO2:  [90 %-98 %] 98 % (03/13 1556) Weight:  [150 lb 12.7 oz (68.4 kg)] 150 lb 12.7 oz (68.4 kg) (03/13 0534)  Hemodynamic parameters for last 24 hours:    Intake/Output from previous day: 03/12 0701 - 03/13 0700 In: 620 [P.O.:120; IV Piggyback:500] Out: 800 [Urine:800] Intake/Output this shift: Total I/O In: 410 [P.O.:360; IV Piggyback:50] Out: 950 [Urine:950]  General appearance: alert and cooperative Lungs: diminished breath sounds left base Abdomen: distended nontender  Lab Results:  Recent Labs  05/09/15 0517 05/10/15 0624  WBC 4.1 5.9  HGB 13.0 12.9*  HCT 40.6 41.2  PLT 176 186   BMET:  Recent Labs  05/09/15 0517 05/10/15 0624  NA 141 140  K 3.9 3.0*  CL 102 103  CO2 28 29  GLUCOSE 121* 95  BUN 29* 30*  CREATININE 1.19 1.22  CALCIUM 8.1* 7.9*    PT/INR:  Recent Labs  05/10/15 0624  LABPROT 17.4*  INR 1.42   ABG    Component Value Date/Time   PHART 7.380 05/08/2015 2118   HCO3 31.1* 05/08/2015 2118   TCO2 33 05/08/2015 2118   ACIDBASEDEF 3.1* 03/27/2015 0115   O2SAT 93.0 05/08/2015 2118   CBG (last 3)  No results for input(s): GLUCAP in the last 72 hours.  Assessment/Plan: S/P   Mr. Benjamin Bell is a 60 yo man with cirrhosis who had multiple rib fractures and a hemothorax in early January. He has had issues with a recurring left pleural effusion since then. I saw him in the office about three weeks ago and her had minimal fluid at the time. He now presents with a large left effusion. He had thoracentesis which helped symptomatically but still has a large effusion.  This would be highly unusual to be related to  his previous trauma and I suspect something else is the underlying cause. The most likely is his cirrhosis and ascites. That should be addressed while he is in the hospital. The fluid was unremarkable except for an elevated LDH.   He has known loculated areas of fluid so I do not think a chest tube at the bedside will be effective. Probably the best option is to do a VATS to drain all the fluid and leave a pleural catheter in place.  Unfortunately I will be out of town from this Wednesday until next Thursday. Dr. Tyrone SageGerhardt will see him in my absence and determine whether to proceed with VATS.   LOS: 2 days    Loreli SlotSteven C Noemi Bellissimo 05/10/2015

## 2015-05-10 NOTE — Progress Notes (Deleted)
PULMONARY / CRITICAL CARE MEDICINE   Name: Benjamin Bell MRN: [id number] DOB: [birth year]    ADMISSION DATE:  05/08/2015 CONSULTATION DATE:  05/08/15  REFERRING MD:  ED physician  CHIEF COMPLAINT:  Shortness of breath  SUBJECTIVE:   05/10/15: Patient feels much better today. "Able to breathe much better since they  Drained the fluids out of my chest yesterday"  VITAL SIGNS: BP 123/78 mmHg  Pulse 84  Temp(Src) 98.3 F (36.8 C) (Oral)  Resp 20  Ht  (1.803 m)  Wt 150 lb 12.7 oz (68.4 kg)  BMI 21.04 kg/m2  SpO2 95%  HEMODYNAMICS:    VENTILATOR SETTINGS:    INTAKE / OUTPUT: I/O last 3 completed shifts: In: 1244.8 [P.O.:120; I.V.:24.8; IV Piggyback:1100] Out: 800 [Urine:800]  PHYSICAL EXAMINATION: General: Awake, co-operative,does not appear to be any distress Neuro:  Alert, oriented, follows commands, answers appropriately HEENT: Atraumatic, normocephalic,PERRL, no discharge noted Cardiovascular: S1S2, RRR, no MRG noted Lungs: diminished breath sounds on left, Clear otherwise, no use of accessory muscle use.  Abdomen: , +BS, Soft, Nontender Musculoskeletal:  No joint abnormalities Skin:  No rashes, bruises, ecchymosis or other lesions.   LABS:  BMET  Recent Labs Lab 05/08/15 1512 05/08/15 2204 05/09/15 0517 05/10/15 0624  NA 136  --  141 140  K 2.3*  --  3.9 3.0*  CL 97*  --  102 103  CO2 29  --  28 29  BUN 28*  --  29* 30*  CREATININE 1.39* 1.28* 1.19 1.22  GLUCOSE 137*  --  121* 95    Electrolytes  Recent Labs Lab 05/08/15 1512 05/09/15 0517 05/10/15 0624  CALCIUM 8.5* 8.1* 7.9*    CBC  Recent Labs Lab 05/08/15 2204 05/09/15 0517 05/10/15 0624  WBC 5.0 4.1 5.9  HGB 12.2* 13.0 12.9*  HCT 38.0* 40.6 41.2  PLT 164 176 186    Coag's  Recent Labs Lab 05/08/15 1512 05/10/15 0624  INR 1.44 1.42    Sepsis Markers  Recent Labs Lab 05/08/15 1534 05/08/15 1740  LATICACIDVEN 2.15* 1.08    ABG  Recent Labs Lab  05/08/15 1805 05/08/15 2118  PHART 7.354 7.380  PCO2ART 55.2* 52.6*  PO2ART 61.0* 69.0*    Liver Enzymes  Recent Labs Lab 05/08/15 1512 05/10/15 0624  AST 54* 53*  ALT 22 20  ALKPHOS 190* 151*  BILITOT 0.8 1.1  ALBUMIN 2.2* 1.8*    Cardiac Enzymes No results for input(s): TROPONINI, PROBNP in the last 168 hours.  Glucose No results for input(s): GLUCAP in the last 168 hours.  Imaging Dg Chest 1 View  05/09/2015  CLINICAL DATA:  Pleural effusion, post left thoracentesis EXAM: CHEST 1 VIEW COMPARISON:  05/08/2015 FINDINGS: Slight decrease in the large left pleural effusion following thoracentesis. No pneumothorax. Left lower lobe atelectasis or infiltrate. Heart is likely mildly enlarged. No confluent opacity on the right. IMPRESSION: Slight decreased size of the large left pleural effusion following thoracentesis. No pneumothorax. Electronically Signed   By: Charlett Nose M.D.   On: 05/09/2015 12:18   US Thoracentesis Asp Pleural Space W/img Guide  05/09/2015  INDICATION: Symptomatic left sided pleural effusion EXAM: US THORACENTESIS ASP PLEURAL SPACE W/IMG GUIDE COMPARISON:  None. MEDICATIONS: 10 cc 1% lidocaine COMPLICATIONS: None immediate. TECHNIQUE: Informed written consent was obtained from the patient after a discussion of the risks, benefits and alternatives to treatment. A timeout was performed prior to the initiation of the procedure. Initial ultrasound scanning demonstrates a left pleural effusion. The lower  chest was prepped and draped in the usual sterile fashion. 1% lidocaine was used for local anesthesia. Under direct ultrasound guidance, a 19 gauge, 7-cm, Yueh catheter was introduced. An ultrasound image was saved for documentation purposes. The thoracentesis was performed. The catheter was removed and a dressing was applied. The patient tolerated the procedure well without immediate post procedural complication. The patient was escorted to have an upright chest  radiograph. FINDINGS: A total of approximately 1.6 liters of cloudy yellow fluid was removed. Requested samples were sent to the laboratory. IMPRESSION: Successful ultrasound-guided Left sided thoracentesis yielding 1.6 liters of pleural fluid. Read by: Robet LeuPamela A [facility] Electronically Signed   By: Malachy MoanHeath  McCullough M.D.   On: 05/09/2015 12:01     STUDIES:  CXR 3/11 - increasing right pleural effusion  CULTURES:  3/11- Blood>>  3/12- Pleural fluid>>  ANTIBIOTICS: Vancomycin 3/11 >>> Zosyn 3/11 >>>  SIGNIFICANT EVENTS: 3/12> Lt sided thoracentesis drained 1.6l of cloudy yellow fluid.  LINES/TUBES: none  DISCUSSION: 60 y/o with COPD exacerbation and recurrent right pleural effusion.  Underwent Thoracentesis on 3/12. Considering vats VS pleurix for long term treatment for recurrent effusion.  ASSESSMENT / PLAN:  PULMONARY A: COPD exacerbation, Pleural effusion. Hx traumatic L hydropneumothorax Jan, 2017 with continued drainage until chest tube site healed over. Continue Duoneb/albutero/Dulera Continue solumedrol Maintain Sats >88-92% thoracentesis on 3/12 drained about 1.6 l of fluid Considering VATS Vs pleurix by CT surgeons.  CARDIOVASCULAR A:  HTN, edema P:  Continue to hold Norvasc,lisinopril for now  Will need to be diureses,Maintaining positive balance however is  hypokalemic.  RENAL A:   Hypokalemia, Acute Kidney injury P:   Replace  Potassium Follow chemistry   INFECTIOUS A:   Pleural effusion.  P:   Treated with Van/ Zosyn.   Follow Blood cultures, can d/c if no growth for 48 hours  Methadone maintenance, had recently called our office seeking oxycodone for neck pain Reports daily maintenance through [facility] [place] P Continue methadone 51mg ( stated maintenance dose)  Hepatic cirrhosis Previous ETOH abuse Transaminitis P-  follow LFTs intermittently    Biana Haggar,AG-ACNP Pulmonary & Critical Care

## 2015-05-10 NOTE — Progress Notes (Signed)
PULMONARY / CRITICAL CARE MEDICINE   Name: Benjamin Bell MRN: [id number] DOB: [birth year]    ADMISSION DATE:  05/08/2015 CONSULTATION DATE:  05/08/15  REFERRING MD:  ED physician  CHIEF COMPLAINT:  Shortness of breath  HISTORY OF PRESENT ILLNESS:   Mr. Benjamin Bell is a 60 y/o man with past medical history significant for COPD, cirrhosis and recurrent left sided pleural effusion.  He is followed by Dr. Kendrick Fries for this.  Hosp January, 2017 for traumatic left hemopneumothorax with rib fractures. After chest tube removal, he continued drainage from chest tube site, described as projectile across room if he coughed. Since site finally sealed over, he has noted gradually increasing dyspnea.  He was brought to the [facility] ED via EMS after calling and complaining of increasing shortness of breath.  He was noted to have sats of 68% on RA.  He was given albuterol, atrovent and solumedrol by EMS.  A chest x-ray done in the ED showed increasing left sided pleural effusion. States he no longer drinks ETOH. 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  SUBJECTIVE:  Denies chest pain. SOB improved post procedure.  VITAL SIGNS: BP 123/78 mmHg  Pulse 84  Temp(Src) 98.3 F (36.8 C) (Oral)  Resp 20  Ht  (1.803 m)  Wt 68.4 kg (150 lb 12.7 oz)  BMI 21.04 kg/m2  SpO2 95%  HEMODYNAMICS:    VENTILATOR SETTINGS:    INTAKE / OUTPUT: I/O last 3 completed shifts: In: 1244.8 [P.O.:120; I.V.:24.8; IV Piggyback:1100] Out: 800 [Urine:800]  PHYSICAL EXAMINATION: General: Awake, polite, cooperative, NAD Neuro:  Oriented x 3, gross vision and hearing intact, moves all extremities, non-lateralizing, no tremor  HEENT:  PERRL, EOMI, OP clear Cardiovascular:  NRRR, no MRG Lungs:  Unlabored on O2,  prolonged expiratory phase, diminished breath sounds on left, dull, no rub Abdomen:  Softly distended, NTND, +BS, no palpable HSM,  question ascites Musculoskeletal:  No joint abnormalities, 2+ edema from mid shin to ankles Skin:  No rashes, bruises or other lesions.   LABS:  BMET  Recent Labs Lab 05/08/15 1512 05/08/15 2204 05/09/15 0517 05/10/15 0624  NA 136  --  141 140  K 2.3*  --  3.9 3.0*  CL 97*  --  102 103  CO2 29  --  28 29  BUN 28*  --  29* 30*  CREATININE 1.39* 1.28* 1.19 1.22  GLUCOSE 137*  --  121* 95    Electrolytes  Recent Labs Lab 05/08/15 1512 05/09/15 0517 05/10/15 0624  CALCIUM 8.5* 8.1* 7.9*    CBC  Recent Labs Lab 05/08/15 2204 05/09/15 0517 05/10/15 0624  WBC 5.0 4.1 5.9  HGB 12.2* 13.0 12.9*  HCT 38.0* 40.6 41.2  PLT 164 176 186    Coag's  Recent Labs Lab 05/08/15 1512 05/10/15 0624  INR 1.44 1.42    Sepsis Markers  Recent Labs Lab 05/08/15 1534 05/08/15 1740  LATICACIDVEN 2.15* 1.08    ABG  Recent Labs Lab 05/08/15 1805 05/08/15 2118  PHART 7.354 7.380  PCO2ART 55.2* 52.6*  PO2ART 61.0* 69.0*    Liver Enzymes  Recent Labs Lab 05/08/15 1512 05/10/15 0624  AST 54* 53*  ALT 22 20  ALKPHOS 190* 151*  BILITOT 0.8 1.1  ALBUMIN 2.2* 1.8*    Cardiac Enzymes No results for input(s): TROPONINI, PROBNP in the last 168 hours.  Glucose No results for input(s): GLUCAP in  the last 168 hours.  Imaging Dg Chest 1 View  05/09/2015  CLINICAL DATA:  Pleural effusion, post left thoracentesis EXAM: CHEST 1 VIEW COMPARISON:  05/08/2015 FINDINGS: Slight decrease in the large left pleural effusion following thoracentesis. No pneumothorax. Left lower lobe atelectasis or infiltrate. Heart is likely mildly enlarged. No confluent opacity on the right. IMPRESSION: Slight decreased size of the large left pleural effusion following thoracentesis. No pneumothorax. Electronically Signed   By: Charlett NoseKevin  Dover M.D.   On: 05/09/2015 12:18   [place] Thoracentesis Asp Pleural Space W/img Guide  05/09/2015  INDICATION: Symptomatic left sided pleural effusion EXAM: US  THORACENTESIS ASP PLEURAL SPACE W/IMG GUIDE COMPARISON:  None. MEDICATIONS: 10 cc 1% lidocaine COMPLICATIONS: None immediate. TECHNIQUE: Informed written consent was obtained from the patient after a discussion of the risks, benefits and alternatives to treatment. A timeout was performed prior to the initiation of the procedure. Initial ultrasound scanning demonstrates a left pleural effusion. The lower chest was prepped and draped in the usual sterile fashion. 1% lidocaine was used for local anesthesia. Under direct ultrasound guidance, a 19 gauge, 7-cm, Yueh catheter was introduced. An ultrasound image was saved for documentation purposes. The thoracentesis was performed. The catheter was removed and a dressing was applied. The patient tolerated the procedure well without immediate post procedural complication. The patient was escorted to have an upright chest radiograph. FINDINGS: A total of approximately 1.6 liters of cloudy yellow fluid was removed. Requested samples were sent to the laboratory. IMPRESSION: Successful ultrasound-guided Left sided thoracentesis yielding 1.6 liters of pleural fluid. Read by: Robet LeuPamela A Turpin [facility] Electronically Signed   By: Malachy MoanHeath  McCullough M.D.   On: 05/09/2015 12:01     STUDIES:  CXR 3/11 - increasing right pleural effusion  CULTURES: Blood  ANTIBIOTICS: Vancomycin 3/11 >>> Zosyn 3/11 >>>  SIGNIFICANT EVENTS:   LINES/TUBES:  I reviewed CXR myself, no evidence of PTX.  DISCUSSION: 60 y/o with COPD exacerbation and recurrent right pleural effusion. Will likely need chest tube with possible pleuradesis.   ASSESSMENT / PLAN:  PULMONARY A: COPD exacerbation, Pleural effusion. Hx traumatic L hydropneumothorax Jan, 2017 with continued drainage until chest tube site healed over. Question how much may be ascites fluid. P:   Nebs Solumedrol 60 mg IV daily. Titrate O2 for sat of 88-92%. Follow effusion with diuresis. Thoracentesis done, will check CXR in  AM for rate of reaccumulation to determine pleurodesis vs pleurex catheter placement. Thoracic surgery consulted to consider chest tube, ? Pleurodesis. Pleural fluid transudative as expected.  CARDIOVASCULAR A:  HTN, edema P:  Holding home meds for now Lasix 40 mg PO BID. Aldactone 12.5 mg PO BID K-dur 40 mg PO x2 doses.  RENAL A:   hypokalemia P:   Replete potassium Lasix Aldactone K-dur as above.  INFECTIOUS A:   Pleural effusion.  P:   Treated with broad spectrum abx.  Can d/c when blood cx negative x 48h. Doubt infection.  Methadone maintenance, had recently called our office seeking oxycodone for neck pain Reports daily maintenance through [facility] of  P: Resume stated maintenance dose 51 mg po once daily  Hepatic cirrhosis Previous ETOH abuse Transaminitis P- follow LFTs intermittently  FAMILY  - Updates: Discussed w patient and wife in room  - Inter-disciplinary family meet or Palliative Care meeting due by:  day 7  Discussed with [facility]-MD, [facility] and PCCM will stay as consult.  Alyson ReedyWesam G. Yacoub, M.D. [facility] Pulmonary/Critical Care Medicine. Pager: [phone]. After  hours pager: [phone].  05/10/2015, 10:44 AM

## 2015-05-10 NOTE — Care Management Note (Signed)
Case Management Note  Patient Details  Name: Benjamin Bell MRN: [id number] Date of Birth: [birth year]  Subjective/Objective:     Date: 05/10/15 Spoke with patient at the bedside.  Introduced self as Sports coachcase manager and explained role in discharge planning and how to be reached.  Verified patient lives in town, with spouse. Has a cane and a rolling walker.  Expressed potential need for no other DME.  Verified patient anticipates to go home with family,  at time of discharge and will have part-time supervision by family and  friends at this time to best of their knowledge.  Patient  denied needing help with their medication, Has Medicaid.  Patient is driven by Medicaid Transport,bus or neighbor to MD appointments.  Verified patient has PCP Donia Guilesavid Talbot.   Plan: CM will continue to follow for discharge planning and [facility] resources.                Action/Plan:   Expected Discharge Date:                  Expected Discharge Plan:  Home/Self Care  In-House Referral:     Discharge planning Services  CM Consult  Post Acute Care Choice:    Choice offered to:     DME Arranged:    DME Agency:     HH Arranged:    HH Agency:     Status of Service:  In process, will continue to follow  Medicare Important Message Given:    Date Medicare IM Given:    Medicare IM give by:    Date Additional Medicare IM Given:    Additional Medicare Important Message give by:     If discussed at Long Length of Stay Meetings, dates discussed:    Additional Comments:  Leone Havenaylor, Raeanna Soberanes Clinton, RN 05/10/2015, 12:44 PM

## 2015-05-11 ENCOUNTER — Inpatient Hospital Stay (HOSPITAL_COMMUNITY): Payer: Medicaid Other

## 2015-05-11 DIAGNOSIS — I10 Essential (primary) hypertension: Secondary | ICD-10-CM | POA: Diagnosis present

## 2015-05-11 DIAGNOSIS — J90 Pleural effusion, not elsewhere classified: Secondary | ICD-10-CM | POA: Diagnosis present

## 2015-05-11 DIAGNOSIS — R188 Other ascites: Secondary | ICD-10-CM | POA: Diagnosis present

## 2015-05-11 DIAGNOSIS — K7460 Unspecified cirrhosis of liver: Secondary | ICD-10-CM | POA: Diagnosis present

## 2015-05-11 LAB — BASIC METABOLIC PANEL
Anion gap: 12 (ref 5–15)
BUN: 26 mg/dL — ABNORMAL HIGH (ref 6–20)
CALCIUM: 8 mg/dL — AB (ref 8.9–10.3)
CO2: 29 mmol/L (ref 22–32)
CREATININE: 1.16 mg/dL (ref 0.61–1.24)
Chloride: 102 mmol/L (ref 101–111)
GFR calc non Af Amer: >60 mL/min (ref >60)
GLUCOSE: 86 mg/dL (ref 65–99)
Potassium: 3.9 mmol/L (ref 3.5–5.1)
Sodium: 143 mmol/L (ref 135–145)

## 2015-05-11 LAB — COMPREHENSIVE METABOLIC PANEL
ALT: 26 U/L (ref 17–63)
AST: 60 U/L — ABNORMAL HIGH (ref 15–41)
Albumin: 2 g/dL — ABNORMAL LOW (ref 3.5–5.0)
Alkaline Phosphatase: 175 U/L — ABNORMAL HIGH (ref 38–126)
Anion gap: 8 (ref 5–15)
BUN: 22 mg/dL — ABNORMAL HIGH (ref 6–20)
CO2: 33 mmol/L — ABNORMAL HIGH (ref 22–32)
Calcium: 8.2 mg/dL — ABNORMAL LOW (ref 8.9–10.3)
Chloride: 100 mmol/L — ABNORMAL LOW (ref 101–111)
Creatinine, Ser: 1.1 mg/dL (ref 0.61–1.24)
GFR calc Af Amer: >60 mL/min (ref >60)
GFR calc non Af Amer: >60 mL/min (ref >60)
Glucose, Bld: 150 mg/dL — ABNORMAL HIGH (ref 65–99)
Potassium: 4.5 mmol/L (ref 3.5–5.1)
Sodium: 141 mmol/L (ref 135–145)
Total Bilirubin: 0.7 mg/dL (ref 0.3–1.2)
Total Protein: 6.5 g/dL (ref 6.5–8.1)

## 2015-05-11 LAB — PROTIME-INR
INR: 1.31 (ref 0.00–1.49)
Prothrombin Time: 16.4 seconds — ABNORMAL HIGH (ref 11.6–15.2)

## 2015-05-11 LAB — BLOOD GAS, ARTERIAL
Acid-Base Excess: 8.3 mmol/L — ABNORMAL HIGH (ref 0.0–2.0)
Bicarbonate: 32.4 mEq/L — ABNORMAL HIGH (ref 20.0–24.0)
Drawn by: 244861
O2 Content: 2 L/min
O2 Saturation: 93.3 %
Patient temperature: 97.6
TCO2: 33.8 mmol/L (ref 0–100)
pCO2 arterial: 44.2 mmHg (ref 35.0–45.0)
pH, Arterial: 7.475 — ABNORMAL HIGH (ref 7.350–7.450)
pO2, Arterial: 63.3 mmHg — ABNORMAL LOW (ref 80.0–100.0)

## 2015-05-11 LAB — CBC
HCT: 43.3 % (ref 39.0–52.0)
Hemoglobin: 13.9 g/dL (ref 13.0–17.0)
MCH: 29.6 pg (ref 26.0–34.0)
MCHC: 32.1 g/dL (ref 30.0–36.0)
MCV: 92.1 fL (ref 78.0–100.0)
Platelets: 171 10*3/uL (ref 150–400)
RBC: 4.7 MIL/uL (ref 4.22–5.81)
RDW: 16.2 % — ABNORMAL HIGH (ref 11.5–15.5)
WBC: 6.8 10*3/uL (ref 4.0–10.5)

## 2015-05-11 LAB — RAPID URINE DRUG SCREEN, HOSP PERFORMED
Amphetamines: NOT DETECTED
BENZODIAZEPINES: NOT DETECTED
Barbiturates: NOT DETECTED
COCAINE: NOT DETECTED
OPIATES: NOT DETECTED
Tetrahydrocannabinol: NOT DETECTED

## 2015-05-11 LAB — PHOSPHORUS: Phosphorus: 1.8 mg/dL — ABNORMAL LOW (ref 2.5–4.6)

## 2015-05-11 LAB — APTT: aPTT: 29 seconds (ref 24–37)

## 2015-05-11 LAB — MAGNESIUM: Magnesium: 1.7 mg/dL (ref 1.7–2.4)

## 2015-05-11 MED ORDER — SPIRONOLACTONE 25 MG PO TABS
25.0000 mg | ORAL_TABLET | Freq: Two times a day (BID) | ORAL | Status: DC
  Administered 2015-05-11 – 2015-05-15 (×7): 25 mg via ORAL
  Filled 2015-05-11 (×7): qty 1

## 2015-05-11 MED ORDER — DEXTROSE 5 % IV SOLN
1.5000 g | INTRAVENOUS | Status: DC
  Filled 2015-05-11: qty 1.5

## 2015-05-11 MED ORDER — MAGNESIUM SULFATE 2 GM/50ML IV SOLN
2.0000 g | Freq: Once | INTRAVENOUS | Status: AC
  Administered 2015-05-11: 2 g via INTRAVENOUS
  Filled 2015-05-11: qty 50

## 2015-05-11 MED ORDER — POTASSIUM PHOSPHATES 15 MMOLE/5ML IV SOLN
30.0000 mmol | Freq: Once | INTRAVENOUS | Status: AC
  Administered 2015-05-11: 30 mmol via INTRAVENOUS
  Filled 2015-05-11: qty 10

## 2015-05-11 MED ORDER — METHYLPREDNISOLONE SODIUM SUCC 40 MG IJ SOLR
40.0000 mg | Freq: Every day | INTRAMUSCULAR | Status: DC
  Administered 2015-05-12: 40 mg via INTRAVENOUS
  Filled 2015-05-11: qty 1

## 2015-05-11 MED ORDER — CEFUROXIME SODIUM 1.5 G IJ SOLR
1.5000 g | INTRAMUSCULAR | Status: DC
  Filled 2015-05-11 (×2): qty 1.5

## 2015-05-11 NOTE — Progress Notes (Signed)
[facility] TEAM 1 - Stepdown/ICU TEAM Progress Note  Benjamin Bell ZOX:[id number] DOB: Jan 22, 1956 DOA: 05/08/2015 PCP: Mia Creek, MD  Admit HPI / Brief Narrative: Mr. Handlin is a 60 y/o WM PMHx COPD,Recurrent left sided pleural effusion. He is followed by Dr. Kendrick Fries for this. He was brought to the [facility] ED via EMS after calling and complaining of increasing shortness of breath. He was noted to have sats of 68% on RA. He was given albuterol, atrovent and solumedrol by EMS. A chest x-ray done in the ED showed increasing right sided pleural effusion.   HPI/Subjective: 3/14 A/O 4, states this is second time he's had a pleural effusion. First time was secondary to trauma, this time spontaneously. On 2 L O2 via [place] at home  Assessment/Plan: Lt Pleural effusion. -Left side pleural effusion. Previous Lt Hydropneumothorax Jan, 2017 with continued drainage until chest tube site healed over.  -S/P thoracentesis NGTD -Per Cardiothoracic surgery/PCCM Pleurx cath vs VATS  -Pleural fluid transudative as expected.  COPD -Albuterol nebs PRN -DuoNeb QID -Solu-Medrol 40 mg daily -Dulera BID  HTN/ Edema -Lasix 40 mg BID -Spironolactone 25 mg BID  Chronic pain syndrome (neck pain) -Methadone 50 mg daily -Reports daily maintenance through [facility] of [place]  EtOH abuse/Hepatic cirrhosis -Patient with cirrhotic liver see abdominal echo below  -Liver lesion 2.2 cm requiring abdominal MRI -Acute hepatitis panel pending -HIV pending     Code Status: FULL Family Communication: no family present at time of exam Disposition Plan: Resolution left lower effusion    Consultants: Dr.Wesam [facility] CM Dr.John [facility]   Procedure/Significant Events: 3/12 left thoracentesis: 1.6 L cloudy yellow fluid 3/14 PCXR;-Large left pleural effusion, slightly increased in size  -New mild interstitial and airspace pulmonary edema indicating CHFand/or fluid overload. 3/14 abdominal ultrasound;-  hepatic cirrhosis and trace amount of perihepatic ascites. -Right hepatic dome 2.2 cm hypoechoic solid lesion  -Chronic common bile duct dilatation.-Moderately distended gallbladder with mild wall thickening and pericholecystic fluid and intraluminal mobile echogenic sludge. Gallbladder appearance 2dary chronic hepatic disease/hypoproteinemia. -Incidental right renal cysts--Left upper pole nephrolithiasis   Culture 3/11 blood right hand/forearm NGTD 3/12 MRSA positive  3/12 left pleural fluid NGTD   Antibiotics: Cefuroxime 3/14 1 dose  DVT prophylaxis: Subcutaneous heparin   Devices    LINES / TUBES:      Continuous Infusions:   Objective: VITAL SIGNS: Temp: 97.8 F (36.6 C) (03/14 1127) Temp Source: Oral (03/14 1127) BP: 125/74 mmHg (03/14 1126) Pulse Rate: 108 (03/14 1126) SPO2; FIO2:   Intake/Output Summary (Last 24 hours) at 05/11/15 1400 Last data filed at 05/11/15 1009  Gross per 24 hour  Intake    790 ml  Output   2625 ml  Net  -1835 ml     Exam: General: A/O 4, chronic Respiratory distress (on home setting of 2 L via [place])  Eyes: Negative headache, negative scleral hemorrhage ENT: Negative Runny nose, negative gingival bleeding, Neck:  Negative scars, masses, torticollis, lymphadenopathy, JVD Lungs: absent lung sounds all left lung fields, diffuse wheezing all right lung fields, basilar crackles RLL  Cardiovascular: Regular rate and rhythm without murmur gallop or rub normal S1 and S2 Abdomen:negative abdominal pain, distended, positive soft, bowel sounds, no rebound, no ascites, no appreciable mass Extremities: No significant cyanosis, clubbing, or edema bilateral lower extremities Psychiatric:  Negative depression, negative anxiety, negative fatigue, negative mania  Neurologic:  Cranial nerves II through XII intact, tongue/uvula midline, all extremities muscle strength 5/5, sensation intact throughout,negative  dysarthria, negative expressive  aphasia, negative receptive aphasia.   Data Reviewed: Basic Metabolic Panel:  Recent Labs Lab 05/08/15 1512 05/08/15 2204 05/09/15 0517 05/10/15 0624 05/11/15 0335  NA 136  --  141 140 143  K 2.3*  --  3.9 3.0* 3.9  CL 97*  --  102 103 102  CO2 29  --  28 29 29   GLUCOSE 137*  --  121* 95 86  BUN 28*  --  29* 30* 26*  CREATININE 1.39* 1.28* 1.19 1.22 1.16  CALCIUM 8.5*  --  8.1* 7.9* 8.0*  MG  --   --   --   --  1.7  PHOS  --   --   --   --  1.8*   Liver Function Tests:  Recent Labs Lab 05/08/15 1512 05/10/15 0624  AST 54* 53*  ALT 22 20  ALKPHOS 190* 151*  BILITOT 0.8 1.1  PROT 6.3* 5.9*  ALBUMIN 2.2* 1.8*   No results for input(s): LIPASE, AMYLASE in the last 168 hours.  Recent Labs Lab 05/08/15 1512  AMMONIA 45*   CBC:  Recent Labs Lab 05/08/15 1512 05/08/15 2204 05/09/15 0517 05/10/15 0624  WBC 6.7 5.0 4.1 5.9  NEUTROABS 5.2  --   --   --   HGB 13.1 12.2* 13.0 12.9*  HCT 41.0 38.0* 40.6 41.2  MCV 92.3 91.8 91.2 92.4  PLT 189 164 176 186   Cardiac Enzymes: No results for input(s): CKTOTAL, CKMB, CKMBINDEX, TROPONINI in the last 168 hours. BNP (last 3 results)  Recent Labs  03/26/15 1738  BNP 125.1*    ProBNP (last 3 results) No results for input(s): PROBNP in the last 8760 hours.  CBG: No results for input(s): GLUCAP in the last 168 hours.  Recent Results (from the past 240 hour(s))  Blood culture (routine x 2)     Status: None (Preliminary result)   Collection Time: 05/08/15  4:40 PM  Result Value Ref Range Status   Specimen Description BLOOD RIGHT HAND  Final   Special Requests BOTTLES DRAWN AEROBIC AND ANAEROBIC 5CC  Final   Culture NO GROWTH 3 DAYS  Final   Report Status PENDING  Incomplete  Blood culture (routine x 2)     Status: None (Preliminary result)   Collection Time: 05/08/15  5:29 PM  Result Value Ref Range Status   Specimen Description BLOOD RIGHT FOREARM  Final   Special Requests BOTTLES DRAWN AEROBIC AND  ANAEROBIC 5CC  Final   Culture NO GROWTH 3 DAYS  Final   Report Status PENDING  Incomplete  MRSA PCR Screening     Status: Abnormal   Collection Time: 05/09/15  7:38 AM  Result Value Ref Range Status   MRSA by PCR POSITIVE (A) NEGATIVE Final    Comment:        The GeneXpert MRSA Assay (FDA approved for NASAL specimens only), is one component of a comprehensive MRSA colonization surveillance program. It is not intended to diagnose MRSA infection nor to guide or monitor treatment for MRSA infections. RESULT CALLED TO, READ BACK BY AND VERIFIED WITH: A. MILLS RN 05/09/15 AT 0929 BY A. DAVIS   Culture, body fluid-bottle     Status: None (Preliminary result)   Collection Time: 05/09/15 11:47 AM  Result Value Ref Range Status   Specimen Description FLUID PLEURAL LEFT  Final   Special Requests NONE  Final   Culture NO GROWTH 2 DAYS  Final   Report Status PENDING  Incomplete  Gram stain     Status: None   Collection Time: 05/09/15 11:47 AM  Result Value Ref Range Status   Specimen Description FLUID PLEURAL LEFT  Final   Special Requests NONE  Final   Gram Stain   Final    MODERATE WBC PRESENT,BOTH PMN AND MONONUCLEAR NO ORGANISMS SEEN    Report Status 05/09/2015 FINAL  Final     Studies:  Recent x-ray studies have been reviewed in detail by the Attending Physician  Scheduled Meds:  Scheduled Meds: . antiseptic oral rinse  7 mL Mouth Rinse BID  . Chlorhexidine Gluconate Cloth  6 each Topical Q0600  . furosemide  40 mg Oral BID  . heparin  5,000 Units Subcutaneous 3 times per day  . Influenza vac split quadrivalent PF  0.5 mL Intramuscular Once  . ipratropium-albuterol  3 mL Nebulization Q6H  . methadone  50 mg Oral Daily  . [START ON 05/12/2015] methylPREDNISolone (SOLU-MEDROL) injection  40 mg Intravenous Daily  . mometasone-formoterol  2 puff Inhalation BID  . mupirocin ointment  1 application Nasal BID  . pneumococcal 23 valent vaccine  0.5 mL Intramuscular Once  .  potassium phosphate IVPB (mmol)  30 mmol Intravenous Once  . spironolactone  25 mg Oral BID    Time spent on care of this patient: 40 mins   Aubrynn Katona, Roselind Messier , MD  [facility] Office  [phone] Pager - [phone]  On-Call/Text Page:      [email or web address]      password [email or web address]  If 7PM-7AM, please contact night-coverage [email or web address] Password [email or web address] 05/11/2015, 2:00 PM   LOS: 3 days   Care during the described time interval was provided by me .  I have reviewed this patient's available data, including medical history, events of note, physical examination, and all test results as part of my evaluation. I have personally reviewed and interpreted all radiology studies.   Carolyne Littles, MD [phone] Pager

## 2015-05-11 NOTE — Progress Notes (Addendum)
      [address]       [place] [id number]             [phone]       Procedure(s) (LRB): VIDEO ASSISTED THORACOSCOPY (Left) DRAINAGE OF PLEURAL EFFUSION (Left)  Subjective:  Mr. Benjamin Bell states he is doing okay.  He has no specific complaints and denies shortness of breath and chest pain.  Concern for episode of blood in stool, however patient denies blood on toilet paper.  GI consult was obtained and no intervention was needed at this time.  Objective: Vital signs in last 24 hours: Temp:  [97 F (36.1 C)-98.3 F (36.8 C)] 98.2 F (36.8 C) (03/14 0716) Pulse Rate:  [94-103] 103 (03/14 0713) Cardiac Rhythm:  [-] Normal sinus rhythm (03/14 0733) Resp:  [9-21] 18 (03/14 0713) BP: (120-155)/(63-82) 155/82 mmHg (03/14 0713) SpO2:  [94 %-98 %] 95 % (03/14 0713) Weight:  [163 lb 9.3 oz (74.2 kg)] 163 lb 9.3 oz (74.2 kg) (03/14 0500)  Intake/Output from previous day: 03/13 0701 - 03/14 0700 In: 1150 [P.O.:600; IV Piggyback:550] Out: 3025 [Urine:3025] Intake/Output this shift: Total I/O In: 50 [IV Piggyback:50] Out: 550 [Urine:550]  General appearance: alert, cooperative and no distress Heart: regular rate and rhythm Lungs: diminished breath sounds on left  Lab Results:  Recent Labs  05/09/15 0517 05/10/15 0624  WBC 4.1 5.9  HGB 13.0 12.9*  HCT 40.6 41.2  PLT 176 186   BMET:  Recent Labs  05/10/15 0624 05/11/15 0335  NA 140 143  K 3.0* 3.9  CL 103 102  CO2 29 29  GLUCOSE 95 86  BUN 30* 26*  CREATININE 1.22 1.16  CALCIUM 7.9* 8.0*    PT/INR:  Recent Labs  05/10/15 0624  LABPROT 17.4*  INR 1.42   ABG    Component Value Date/Time   PHART 7.380 05/08/2015 2118   HCO3 31.1* 05/08/2015 2118   TCO2 33 05/08/2015 2118   ACIDBASEDEF 3.1* 03/27/2015 0115   O2SAT 93.0 05/08/2015 2118   CBG (last 3)  No results for input(s): GLUCAP in the last 72 hours.  Assessment/Plan: S/P Procedure(s) (LRB): VIDEO ASSISTED THORACOSCOPY  (Left) DRAINAGE OF PLEURAL EFFUSION (Left)  1. Large Left Pleural Effusion- will likely need VATS 2. GI- ? Blood in stool, GI on board no intervention needed at this time 3. Dispo- care per primary   LOS: 3 days    BARRETT, ERIN 05/11/2015  I have ordered abdominal US today. Consider left VATS,  Bronchoscopy Pleural Biopsy and placement of pleurix catheter  Tomorrow . The goals risks and alternatives of the planned surgical procedure left VATS,  Bronchoscopy Pleural Biopsy and placement of pleurix catheter  have been discussed with the patient in detail. The risks of the procedure including death, infection, stroke, myocardial infarction, bleeding, blood transfusion have all been discussed specifically.  I have quoted Corky MullJames Wedekind a 5 % of perioperative mortality and a complication rate as high as 30 %. The patient's questions have been answered.Corky MullJames Koebel is willing  to proceed with the planned procedure.

## 2015-05-11 NOTE — Consult Note (Signed)
[facility] Consult Note  Referring Provider: No ref. provider found Primary Care Physician:  Marijean Bravo, MD Primary Gastroenterologist:  Dr.  Laurel Dimmer Complaint: Rectal bleeding HPI: Benjamin Bell is an 60 y.o. white male  admitted with persistent left-sided pleural effusion reportedly passed some blood with bowel movement yesterday. There is little documentation of this. The patient states that he did notice some blood when he wiped after straining to pass a hard stool yesterday and that he has noticed this occasionally in the past. He states he had a colonoscopy about 5 or 6 years ago with no significant findings. He also states he has been diagnosed with diverticulitis in the past.  Past Medical History  Diagnosis Date  . Chronic back pain   . Hypertension   . COPD (chronic obstructive pulmonary disease) ([place])   . ARF (acute renal failure) ([facility])     secondary to PNA  . AAA (abdominal aortic aneurysm) ([place])     History reviewed. No pertinent past surgical history.  Medications Prior to Admission  Medication Sig Dispense Refill  . albuterol (PROVENTIL HFA;VENTOLIN HFA) 108 (90 Base) MCG/ACT inhaler Inhale 1-2 puffs into the lungs every 4 (four) hours as needed for wheezing or shortness of breath.    Marland Kitchen amLODipine (NORVASC) 10 MG tablet Take 10 mg by mouth daily.    Marland Kitchen aspirin 81 MG chewable tablet Chew 1 tablet (81 mg total) by mouth daily.    Marland Kitchen ibuprofen (ADVIL,MOTRIN) 600 MG tablet Take 1 tablet (600 mg total) by mouth every 6 (six) hours as needed for moderate pain. 30 tablet 0  . lisinopril-hydrochlorothiazide (PRINZIDE,ZESTORETIC) 20-25 MG tablet Take 1 tablet by mouth daily.    . methadone (DOLOPHINE) 10 MG/5ML solution Take 35.5 mLs (71 mg total) by mouth daily. 120 mL 0  . mometasone-formoterol (DULERA) 100-5 MCG/ACT AERO Inhale 2 puffs into the lungs 2 (two) times daily. 1 Inhaler 6  . Multiple Vitamins-Minerals (MENS MULTI VITAMIN & MINERAL PO) Take 1 tablet by  mouth daily.    . potassium chloride (KLOR-CON) 20 MEQ packet Take 20 mEq by mouth daily.      Allergies: No Known Allergies  No family history on file.  Social History:  reports that he has been smoking Cigarettes.  He has a 21.5 pack-year smoking history. He has never used smokeless tobacco. He reports that he does not drink alcohol or use illicit drugs.  Review of Systems: negative except as above   Blood pressure 120/72, pulse 95, temperature 97 F (36.1 C), temperature source Oral, resp. rate 9, height '5\' 11"'$  (1.803 m), weight 74.2 kg (163 lb 9.3 oz), SpO2 94 %. Head: Normocephalic, without obvious abnormality, atraumatic Neck: no adenopathy, no carotid bruit, no JVD, supple, symmetrical, trachea midline and thyroid not enlarged, symmetric, no tenderness/mass/nodules Resp: clear to auscultation bilaterally Cardio: regular rate and rhythm, S1, S2 normal, no murmur, click, rub or gallop GI: Abdomen soft nondistended no masses Extremities: extremities normal, atraumatic, no cyanosis or edema  Results for orders placed or performed during the hospital encounter of 05/08/15 (from the past 48 hour(s))  MRSA PCR Screening     Status: Abnormal   Collection Time: 05/09/15  7:38 AM  Result Value Ref Range   MRSA by PCR POSITIVE (A) NEGATIVE    Comment:        The GeneXpert MRSA Assay (FDA approved for NASAL specimens only), is one component of a comprehensive MRSA colonization surveillance program. It is not intended to diagnose MRSA  infection nor to guide or monitor treatment for MRSA infections. RESULT CALLED TO, READ BACK BY AND VERIFIED WITH: A. MILLS RN 05/09/15 AT 0929 BY A. DAVIS   Culture, body fluid-bottle     Status: None (Preliminary result)   Collection Time: 05/09/15 11:47 AM  Result Value Ref Range   Specimen Description FLUID PLEURAL LEFT    Special Requests NONE    Culture NO GROWTH < 24 HOURS    Report Status PENDING   Gram stain     Status: None    Collection Time: 05/09/15 11:47 AM  Result Value Ref Range   Specimen Description FLUID PLEURAL LEFT    Special Requests NONE    Gram Stain      MODERATE WBC PRESENT,BOTH PMN AND MONONUCLEAR NO ORGANISMS SEEN    Report Status 05/09/2015 FINAL   Lactate dehydrogenase (CSF, pleural or peritoneal fluid)     Status: Abnormal   Collection Time: 05/09/15 12:24 PM  Result Value Ref Range   LD, Fluid 68 (H) 3 - 23 U/L    Comment: (NOTE) Results should be evaluated in conjunction with serum values    Fluid Type-FLDH Pleural, L   Body fluid cell count with differential     Status: Abnormal   Collection Time: 05/09/15 12:24 PM  Result Value Ref Range   Fluid Type-FCT PLEURAL     Comment: LEFT FLUID CORRECTED ON 03/12 AT 1225: PREVIOUSLY REPORTED AS Pleural, L    Color, Fluid YELLOW YELLOW   Appearance, Fluid CLOUDY (A) CLEAR   WBC, Fluid 95 0 - 1000 cu mm   Neutrophil Count, Fluid 49 (H) 0 - 25 %   Lymphs, Fluid 21 %   Monocyte-Macrophage-Serous Fluid 30 (L) 50 - 90 %   Eos, Fluid 0 %   Other Cells, Fluid MESOTHELIAL CELLS PRESENT %  Protein, pleural or peritoneal fluid     Status: None   Collection Time: 05/09/15 12:25 PM  Result Value Ref Range   Total protein, fluid 1.4 g/dL    Comment: (NOTE) No normal range established for this test Results should be evaluated in conjunction with serum values    Fluid Type-FTP PLEURAL     Comment: LEFT FLUID CORRECTED ON 03/12 AT 1225: PREVIOUSLY REPORTED AS Pleural, L   Glucose, pleural or peritoneal fluid     Status: None   Collection Time: 05/09/15 12:26 PM  Result Value Ref Range   Glucose, Fluid 127 mg/dL    Comment: (NOTE) No normal range established for this test Results should be evaluated in conjunction with serum values    Fluid Type-FGLU PLEURAL     Comment: LEFT FLUID CORRECTED ON 03/12 AT 1227: PREVIOUSLY REPORTED AS Pleural, L   Comprehensive metabolic panel     Status: Abnormal   Collection Time: 05/10/15  6:24 AM   Result Value Ref Range   Sodium 140 135 - 145 mmol/L   Potassium 3.0 (L) 3.5 - 5.1 mmol/L   Chloride 103 101 - 111 mmol/L   CO2 29 22 - 32 mmol/L   Glucose, Bld 95 65 - 99 mg/dL   BUN 30 (H) 6 - 20 mg/dL   Creatinine, Ser 1.22 0.61 - 1.24 mg/dL   Calcium 7.9 (L) 8.9 - 10.3 mg/dL   Total Protein 5.9 (L) 6.5 - 8.1 g/dL   Albumin 1.8 (L) 3.5 - 5.0 g/dL   AST 53 (H) 15 - 41 U/L   ALT 20 17 - 63 U/L   Alkaline Phosphatase 151 (  H) 38 - 126 U/L   Total Bilirubin 1.1 0.3 - 1.2 mg/dL   GFR calc non Af Amer >60 >60 mL/min   GFR calc Af Amer >60 >60 mL/min    Comment: (NOTE) The eGFR has been calculated using the CKD EPI equation. This calculation has not been validated in all clinical situations. eGFR's persistently <60 mL/min signify possible Chronic Kidney Disease.    Anion gap 8 5 - 15  CBC     Status: Abnormal   Collection Time: 05/10/15  6:24 AM  Result Value Ref Range   WBC 5.9 4.0 - 10.5 K/uL   RBC 4.46 4.22 - 5.81 MIL/uL   Hemoglobin 12.9 (L) 13.0 - 17.0 g/dL   HCT 41.2 39.0 - 52.0 %   MCV 92.4 78.0 - 100.0 fL   MCH 28.9 26.0 - 34.0 pg   MCHC 31.3 30.0 - 36.0 g/dL   RDW 16.3 (H) 11.5 - 15.5 %   Platelets 186 150 - 400 K/uL  Protime-INR     Status: Abnormal   Collection Time: 05/10/15  6:24 AM  Result Value Ref Range   Prothrombin Time 17.4 (H) 11.6 - 15.2 seconds   INR 1.42 0.00 - 1.49  Type and screen [place]     Status: None   Collection Time: 05/10/15  6:40 AM  Result Value Ref Range   ABO/RH(D) O POS    Antibody Screen NEG    Sample Expiration 05/13/2015   Urine rapid drug screen (hosp performed)     Status: None   Collection Time: 05/11/15  1:15 AM  Result Value Ref Range   Opiates NONE DETECTED NONE DETECTED   Cocaine NONE DETECTED NONE DETECTED   Benzodiazepines NONE DETECTED NONE DETECTED   Amphetamines NONE DETECTED NONE DETECTED   Tetrahydrocannabinol NONE DETECTED NONE DETECTED   Barbiturates NONE DETECTED NONE DETECTED     Comment:        DRUG SCREEN FOR MEDICAL PURPOSES ONLY.  IF CONFIRMATION IS NEEDED FOR ANY PURPOSE, NOTIFY LAB WITHIN 5 DAYS.        LOWEST DETECTABLE LIMITS FOR URINE DRUG SCREEN Drug Class       Cutoff (ng/mL) Amphetamine      1000 Barbiturate      200 Benzodiazepine   063 Tricyclics       016 Opiates          300 Cocaine          300 THC              50   Basic metabolic panel     Status: Abnormal   Collection Time: 05/11/15  3:35 AM  Result Value Ref Range   Sodium 143 135 - 145 mmol/L   Potassium 3.9 3.5 - 5.1 mmol/L    Comment: DELTA CHECK NOTED   Chloride 102 101 - 111 mmol/L   CO2 29 22 - 32 mmol/L   Glucose, Bld 86 65 - 99 mg/dL   BUN 26 (H) 6 - 20 mg/dL   Creatinine, Ser 1.16 0.61 - 1.24 mg/dL   Calcium 8.0 (L) 8.9 - 10.3 mg/dL   GFR calc non Af Amer >60 >60 mL/min   GFR calc Af Amer >60 >60 mL/min    Comment: (NOTE) The eGFR has been calculated using the CKD EPI equation. This calculation has not been validated in all clinical situations. eGFR's persistently <60 mL/min signify possible Chronic Kidney Disease.    Anion gap 12 5 -  15  Magnesium     Status: None   Collection Time: 05/11/15  3:35 AM  Result Value Ref Range   Magnesium 1.7 1.7 - 2.4 mg/dL  Phosphorus     Status: Abnormal   Collection Time: 05/11/15  3:35 AM  Result Value Ref Range   Phosphorus 1.8 (L) 2.5 - 4.6 mg/dL   Dg Chest 1 View  05/09/2015  CLINICAL DATA:  Pleural effusion, post left thoracentesis EXAM: CHEST 1 VIEW COMPARISON:  05/08/2015 FINDINGS: Slight decrease in the large left pleural effusion following thoracentesis. No pneumothorax. Left lower lobe atelectasis or infiltrate. Heart is likely mildly enlarged. No confluent opacity on the right. IMPRESSION: Slight decreased size of the large left pleural effusion following thoracentesis. No pneumothorax. Electronically Signed   By: Rolm Baptise M.D.   On: 05/09/2015 12:18   US Thoracentesis Asp Pleural Space W/img Guide  05/09/2015   INDICATION: Symptomatic left sided pleural effusion EXAM: US THORACENTESIS ASP PLEURAL SPACE W/IMG GUIDE COMPARISON:  None. MEDICATIONS: 10 cc 1% lidocaine COMPLICATIONS: None immediate. TECHNIQUE: Informed written consent was obtained from the patient after a discussion of the risks, benefits and alternatives to treatment. A timeout was performed prior to the initiation of the procedure. Initial ultrasound scanning demonstrates a left pleural effusion. The lower chest was prepped and draped in the usual sterile fashion. 1% lidocaine was used for local anesthesia. Under direct ultrasound guidance, a 19 gauge, 7-cm, Yueh catheter was introduced. An ultrasound image was saved for documentation purposes. The thoracentesis was performed. The catheter was removed and a dressing was applied. The patient tolerated the procedure well without immediate post procedural complication. The patient was escorted to have an upright chest radiograph. FINDINGS: A total of approximately 1.6 liters of cloudy yellow fluid was removed. Requested samples were sent to the laboratory. IMPRESSION: Successful ultrasound-guided Left sided thoracentesis yielding 1.6 liters of pleural fluid. Read by: Lavonia Drafts [facility] Electronically Signed   By: Jacqulynn Cadet M.D.   On: 05/09/2015 12:01    Assessment: Reported rectal bleeding, may be perianal/hemorrhoidal, hemoglobin normal Plan:  Monitor stools and hemoglobin for possibility of active GI bleeding. No intervention for now. Verlie Liotta C 05/11/2015, 6:54 AM  Pager [phone] If no answer or after 5 PM call [phone]

## 2015-05-11 NOTE — Progress Notes (Signed)
PULMONARY / CRITICAL CARE MEDICINE   Name: Benjamin Bell MRN: [id number] DOB: December 30, 1955    ADMISSION DATE:  05/08/2015 CONSULTATION DATE:  05/08/15  REFERRING MD:  ED physician  CHIEF COMPLAINT:  Shortness of breath  HISTORY OF PRESENT ILLNESS:   Benjamin Bell is a 60 y/o man with past medical history significant for COPD, cirrhosis and recurrent left sided pleural effusion.  He is followed by Dr. Kendrick Fries for this.  Hosp January, 2017 for traumatic left hemopneumothorax with rib fractures. After chest tube removal, he continued drainage from chest tube site, described as projectile across room if he coughed. Since site finally sealed over, he has noted gradually increasing dyspnea.  He was brought to the [facility] ED via EMS after calling and complaining of increasing shortness of breath.  He was noted to have sats of 68% on RA.  He was given albuterol, atrovent and solumedrol by EMS.  A chest x-ray done in the ED showed increasing left sided pleural effusion. States he no longer drinks ETOH. 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  SUBJECTIVE:  Some heme in stool yesterday but seen by GI and no interventions.  VITAL SIGNS: BP 155/82 mmHg  Pulse 103  Temp(Src) 98.2 F (36.8 C) (Oral)  Resp 18  Ht  (1.803 m)  Wt 74.2 kg (163 lb 9.3 oz)  BMI 22.83 kg/m2  SpO2 95%  HEMODYNAMICS:    VENTILATOR SETTINGS:    INTAKE / OUTPUT: I/O last 3 completed shifts: In: 1520 [P.O.:720; IV Piggyback:800] Out: 3325 [Urine:3325]  PHYSICAL EXAMINATION: General: Awake, polite, cooperative, NAD Neuro:  Oriented x 3, gross vision and hearing intact, moves all extremities, non-lateralizing, no tremor  HEENT:  PERRL, EOMI, OP clear Cardiovascular:  NRRR, no MRG Lungs:  Unlabored on O2,  prolonged expiratory phase, diminished breath sounds on left, dull, no rub Abdomen:  Softly distended, NTND, +BS, no palpable  HSM, question ascites Musculoskeletal:  No joint abnormalities, 2+ edema from mid shin to ankles Skin:  No rashes, bruises or other lesions.   LABS:  BMET  Recent Labs Lab 05/09/15 0517 05/10/15 0624 05/11/15 0335  NA 141 140 143  K 3.9 3.0* 3.9  CL 102 103 102  CO2 BUN 29* 30* 26*  CREATININE 1.19 1.22 1.16  GLUCOSE 121* 95 86    Electrolytes  Recent Labs Lab 05/09/15 0517 05/10/15 0624 05/11/15 0335  CALCIUM 8.1* 7.9* 8.0*  MG  --   --  1.7  PHOS  --   --  1.8*    CBC  Recent Labs Lab 05/08/15 2204 05/09/15 0517 05/10/15 0624  WBC 5.0 4.1 5.9  HGB 12.2* 13.0 12.9*  HCT 38.0* 40.6 41.2  PLT 164 176 186    Coag's  Recent Labs Lab 05/08/15 1512 05/10/15 0624  INR 1.44 1.42    Sepsis Markers  Recent Labs Lab 05/08/15 1534 05/08/15 1740  LATICACIDVEN 2.15* 1.08    ABG  Recent Labs Lab 05/08/15 1805 05/08/15 2118  PHART 7.354 7.380  PCO2ART 55.2* 52.6*  PO2ART 61.0* 69.0*    Liver Enzymes  Recent Labs Lab 05/08/15 1512 05/10/15 0624  AST 54* 53*  ALT 22 20  ALKPHOS 190* 151*  BILITOT 0.8 1.1  ALBUMIN 2.2* 1.8*    Cardiac Enzymes No results for input(s): TROPONINI, PROBNP in the last 168 hours.  Glucose No results for input(s): GLUCAP  in the last 168 hours.  Imaging Dg Chest Port 1 View  05/11/2015  CLINICAL DATA:  Followup left pleural effusion and associated atelectasis and/or pneumonia in the left lower lobe. EXAM: PORTABLE CHEST 1 VIEW COMPARISON:  05/08/2005 and earlier. FINDINGS: Large left pleural effusion which may have increased a bit since the examination 2 days ago. Dense consolidation in the left lower lobe and inferior left upper lobe. Interval development of mild interstitial and airspace pulmonary edema throughout the right lung, associated with a small right pleural effusion. IMPRESSION: 1. Large left pleural effusion, slightly increased in size since the examination 2 days ago. 2. New mild  interstitial and airspace pulmonary edema indicating CHF and/or fluid overload. Electronically Signed   By: Hulan Saashomas  Lawrence M.D.   On: 05/11/2015 08:29     STUDIES:  CXR 3/11 - increasing right pleural effusion  CULTURES: Blood  ANTIBIOTICS: Vancomycin 3/11 >>> Zosyn 3/11 >>>  SIGNIFICANT EVENTS:   LINES/TUBES:  I reviewed CXR myself, no evidence of PTX and large left sided effusion.  DISCUSSION: 60 y/o with COPD exacerbation and recurrent right pleural effusion. Will likely need chest tube with possible pleuradesis.   ASSESSMENT / PLAN:  PULMONARY A: COPD exacerbation, Pleural effusion. Hx traumatic L hydropneumothorax Jan, 2017 with continued drainage until chest tube site healed over. Question how much may be ascites fluid. P:   Nebs Solumedrol 60 mg IV daily, decrease to 40 daily x2 days then 20 daily x2 days then d/c. Titrate O2 for sat of 88-92%. Follow effusion with diuresis. Thoracentesis done, will check CXR in AM for rate of reaccumulation to determine pleurodesis vs pleurex catheter placement. Thoracic surgery consulted, abdominal U/S today then will determine if a left sided VATS is necessary and pleurodesis or drain placement. Pleural fluid transudative as expected.  CARDIOVASCULAR A:  HTN, edema P:  Holding home meds for now Lasix 40 mg PO BID. Aldactone 12.5 mg PO BID  RENAL A:   Hypokalemia, mag and phos P:   Replete potassium, mag and phos Lasix 40 po BID Aldactone increase to 25 BID. BMET in AM.  INFECTIOUS A:   Pleural effusion.  P:   Cultures negative, WBC normal, will d/c abx.  Methadone maintenance, had recently called our office seeking oxycodone for neck pain Reports daily maintenance through [facility] P:  Resume stated maintenance dose 51 mg po once daily  Hepatic cirrhosis Previous ETOH abuse Transaminitis P- follow LFTs intermittently  FAMILY  - Updates: Patient updated bedside.  - Inter-disciplinary family  meet or Palliative Care meeting due by:  day 7  Discussed with bedside RN.  Alyson ReedyWesam G. Niyam Bisping, M.D. [facility]. Pager: [phone]. After hours pager: [phone].  05/11/2015, 11:24 AM

## 2015-05-12 ENCOUNTER — Inpatient Hospital Stay (HOSPITAL_COMMUNITY): Payer: Medicaid Other | Admitting: Certified Registered Nurse Anesthetist

## 2015-05-12 ENCOUNTER — Encounter (HOSPITAL_COMMUNITY): Payer: Self-pay | Admitting: Anesthesiology

## 2015-05-12 ENCOUNTER — Inpatient Hospital Stay (HOSPITAL_COMMUNITY): Payer: Medicaid Other

## 2015-05-12 ENCOUNTER — Encounter (HOSPITAL_COMMUNITY)
Admission: EM | Disposition: A | Discharge status: Home Health | Payer: Self-pay | Source: Home / Self Care | Attending: Internal Medicine

## 2015-05-12 DIAGNOSIS — J90 Pleural effusion, not elsewhere classified: Secondary | ICD-10-CM | POA: Diagnosis present

## 2015-05-12 DIAGNOSIS — R0602 Shortness of breath: Secondary | ICD-10-CM

## 2015-05-12 HISTORY — PX: PLEURAL EFFUSION DRAINAGE: SHX5099

## 2015-05-12 HISTORY — PX: CHEST TUBE INSERTION: SHX231

## 2015-05-12 HISTORY — PX: VIDEO ASSISTED THORACOSCOPY: SHX5073

## 2015-05-12 HISTORY — PX: VIDEO BRONCHOSCOPY: SHX5072

## 2015-05-12 LAB — URINALYSIS, ROUTINE W REFLEX MICROSCOPIC
Bilirubin Urine: NEGATIVE
Glucose, UA: NEGATIVE mg/dL
Hgb urine dipstick: NEGATIVE
Ketones, ur: NEGATIVE mg/dL
Leukocytes, UA: NEGATIVE
Nitrite: NEGATIVE
Protein, ur: NEGATIVE mg/dL
Specific Gravity, Urine: 1.015 (ref 1.005–1.030)
pH: 7 (ref 5.0–8.0)

## 2015-05-12 LAB — CBC
HCT: 40.4 % (ref 39.0–52.0)
Hemoglobin: 12.8 g/dL — ABNORMAL LOW (ref 13.0–17.0)
MCH: 29.1 pg (ref 26.0–34.0)
MCHC: 31.7 g/dL (ref 30.0–36.0)
MCV: 91.8 fL (ref 78.0–100.0)
Platelets: 158 K/uL (ref 150–400)
RBC: 4.4 MIL/uL (ref 4.22–5.81)
RDW: 16.2 % — ABNORMAL HIGH (ref 11.5–15.5)
WBC: 6.9 K/uL (ref 4.0–10.5)

## 2015-05-12 LAB — BASIC METABOLIC PANEL
ANION GAP: 10 (ref 5–15)
BUN: 22 mg/dL — ABNORMAL HIGH (ref 6–20)
CHLORIDE: 101 mmol/L (ref 101–111)
CO2: 31 mmol/L (ref 22–32)
Calcium: 8 mg/dL — ABNORMAL LOW (ref 8.9–10.3)
Creatinine, Ser: 0.99 mg/dL (ref 0.61–1.24)
GLUCOSE: 75 mg/dL (ref 65–99)
Potassium: 4.1 mmol/L (ref 3.5–5.1)
Sodium: 142 mmol/L (ref 135–145)

## 2015-05-12 LAB — GRAM STAIN

## 2015-05-12 LAB — LACTATE DEHYDROGENASE, PLEURAL OR PERITONEAL FLUID: LD FL: 97 U/L — AB (ref 3–23)

## 2015-05-12 LAB — BODY FLUID CELL COUNT WITH DIFFERENTIAL
Eos, Fluid: 0 %
Lymphs, Fluid: 28 %
Monocyte-Macrophage-Serous Fluid: 61 % (ref 50–90)
Neutrophil Count, Fluid: 11 % (ref 0–25)
Total Nucleated Cell Count, Fluid: 185 cu mm (ref 0–1000)

## 2015-05-12 LAB — GLUCOSE, SEROUS FLUID: GLUCOSE FL: 79 mg/dL

## 2015-05-12 LAB — PROTEIN, BODY FLUID: Total protein, fluid: <3 g/dL

## 2015-05-12 LAB — PHOSPHORUS: PHOSPHORUS: 3.2 mg/dL (ref 2.5–4.6)

## 2015-05-12 LAB — MAGNESIUM: Magnesium: 2.1 mg/dL (ref 1.7–2.4)

## 2015-05-12 SURGERY — VIDEO ASSISTED THORACOSCOPY
Anesthesia: General | Site: Chest

## 2015-05-12 MED ORDER — 0.9 % SODIUM CHLORIDE (POUR BTL) OPTIME
TOPICAL | Status: DC | PRN
  Administered 2015-05-12: 3000 mL

## 2015-05-12 MED ORDER — FENTANYL CITRATE (PF) 100 MCG/2ML IJ SOLN
INTRAMUSCULAR | Status: DC | PRN
  Administered 2015-05-12: 50 ug via INTRAVENOUS
  Administered 2015-05-12: 100 ug via INTRAVENOUS
  Administered 2015-05-12 (×2): 50 ug via INTRAVENOUS

## 2015-05-12 MED ORDER — ROCURONIUM BROMIDE 100 MG/10ML IV SOLN
INTRAVENOUS | Status: DC | PRN
  Administered 2015-05-12: 50 mg via INTRAVENOUS

## 2015-05-12 MED ORDER — DEXTROSE 5 % IV SOLN
1.5000 g | Freq: Two times a day (BID) | INTRAVENOUS | Status: AC
  Administered 2015-05-12 – 2015-05-13 (×2): 1.5 g via INTRAVENOUS
  Filled 2015-05-12 (×2): qty 1.5

## 2015-05-12 MED ORDER — HYDROMORPHONE HCL 1 MG/ML IJ SOLN
0.2500 mg | INTRAMUSCULAR | Status: DC | PRN
  Administered 2015-05-12 (×2): 0.5 mg via INTRAVENOUS

## 2015-05-12 MED ORDER — ENOXAPARIN SODIUM 40 MG/0.4ML ~~LOC~~ SOLN
40.0000 mg | Freq: Every day | SUBCUTANEOUS | Status: DC
  Administered 2015-05-13 – 2015-05-15 (×3): 40 mg via SUBCUTANEOUS
  Filled 2015-05-12 (×3): qty 0.4

## 2015-05-12 MED ORDER — BISACODYL 5 MG PO TBEC
10.0000 mg | DELAYED_RELEASE_TABLET | Freq: Every day | ORAL | Status: DC
  Administered 2015-05-13 – 2015-05-14 (×2): 10 mg via ORAL
  Filled 2015-05-12 (×3): qty 2

## 2015-05-12 MED ORDER — LACTATED RINGERS IV SOLN
INTRAVENOUS | Status: DC
  Administered 2015-05-12: 15:00:00 via INTRAVENOUS

## 2015-05-12 MED ORDER — ONDANSETRON HCL 4 MG/2ML IJ SOLN: 4.0000 mg | Freq: Four times a day (QID) | INTRAMUSCULAR | Status: DC | PRN

## 2015-05-12 MED ORDER — PHENYLEPHRINE HCL 10 MG/ML IJ SOLN
10.0000 mg | INTRAVENOUS | Status: DC | PRN
  Administered 2015-05-12: 20 ug/min via INTRAVENOUS

## 2015-05-12 MED ORDER — MEPERIDINE HCL 25 MG/ML IJ SOLN: 6.2500 mg | INTRAMUSCULAR | Status: DC | PRN

## 2015-05-12 MED ORDER — HYDROMORPHONE HCL 1 MG/ML IJ SOLN
INTRAMUSCULAR | Status: AC
  Administered 2015-05-12: 0.5 mg via INTRAVENOUS
  Filled 2015-05-12: qty 1

## 2015-05-12 MED ORDER — ONDANSETRON HCL 4 MG/2ML IJ SOLN
INTRAMUSCULAR | Status: AC
  Filled 2015-05-12: qty 2

## 2015-05-12 MED ORDER — PROPOFOL 10 MG/ML IV BOLUS
INTRAVENOUS | Status: AC
  Filled 2015-05-12: qty 20

## 2015-05-12 MED ORDER — MIDAZOLAM HCL 2 MG/2ML IJ SOLN
INTRAMUSCULAR | Status: AC
  Filled 2015-05-12: qty 2

## 2015-05-12 MED ORDER — FENTANYL CITRATE (PF) 100 MCG/2ML IJ SOLN
INTRAMUSCULAR | Status: AC
  Administered 2015-05-12: 25 ug
  Filled 2015-05-12: qty 2

## 2015-05-12 MED ORDER — MIDAZOLAM HCL 2 MG/2ML IJ SOLN
INTRAMUSCULAR | Status: AC
  Administered 2015-05-12: 1 mg
  Filled 2015-05-12: qty 2

## 2015-05-12 MED ORDER — ONDANSETRON HCL 4 MG/2ML IJ SOLN
INTRAMUSCULAR | Status: DC | PRN
  Administered 2015-05-12: 4 mg via INTRAVENOUS

## 2015-05-12 MED ORDER — LIDOCAINE HCL (CARDIAC) 20 MG/ML IV SOLN
INTRAVENOUS | Status: AC
  Filled 2015-05-12: qty 5

## 2015-05-12 MED ORDER — FENTANYL CITRATE (PF) 250 MCG/5ML IJ SOLN
INTRAMUSCULAR | Status: AC
  Filled 2015-05-12: qty 5

## 2015-05-12 MED ORDER — LACTATED RINGERS IV SOLN: INTRAVENOUS | Status: DC

## 2015-05-12 MED ORDER — PROPOFOL 10 MG/ML IV BOLUS
INTRAVENOUS | Status: DC | PRN
  Administered 2015-05-12: 150 mg via INTRAVENOUS

## 2015-05-12 MED ORDER — MIDAZOLAM HCL 5 MG/5ML IJ SOLN
INTRAMUSCULAR | Status: DC | PRN
  Administered 2015-05-12: 2 mg via INTRAVENOUS

## 2015-05-12 MED ORDER — POTASSIUM CHLORIDE 10 MEQ/50ML IV SOLN: 10.0000 meq | Freq: Every day | INTRAVENOUS | Status: DC | PRN

## 2015-05-12 MED ORDER — LACTATED RINGERS IV SOLN
INTRAVENOUS | Status: DC | PRN
  Administered 2015-05-12: 16:00:00 via INTRAVENOUS

## 2015-05-12 MED ORDER — DEXAMETHASONE SODIUM PHOSPHATE 4 MG/ML IJ SOLN
INTRAMUSCULAR | Status: AC
  Filled 2015-05-12: qty 2

## 2015-05-12 MED ORDER — NEOSTIGMINE METHYLSULFATE 10 MG/10ML IV SOLN
INTRAVENOUS | Status: DC | PRN
  Administered 2015-05-12: 3 mg via INTRAVENOUS

## 2015-05-12 MED ORDER — TIOTROPIUM BROMIDE MONOHYDRATE 18 MCG IN CAPS
18.0000 ug | ORAL_CAPSULE | Freq: Every day | RESPIRATORY_TRACT | Status: DC
  Administered 2015-05-12 – 2015-05-14 (×3): 18 ug via RESPIRATORY_TRACT
  Filled 2015-05-12: qty 5

## 2015-05-12 MED ORDER — ROCURONIUM BROMIDE 50 MG/5ML IV SOLN
INTRAVENOUS | Status: AC
  Filled 2015-05-12: qty 1

## 2015-05-12 MED ORDER — SENNOSIDES-DOCUSATE SODIUM 8.6-50 MG PO TABS
1.0000 | ORAL_TABLET | Freq: Every day | ORAL | Status: DC
  Administered 2015-05-14: 1 via ORAL
  Filled 2015-05-12 (×3): qty 1

## 2015-05-12 MED ORDER — IPRATROPIUM-ALBUTEROL 0.5-2.5 (3) MG/3ML IN SOLN: 3.0000 mL | RESPIRATORY_TRACT | Status: DC | PRN

## 2015-05-12 MED ORDER — GLYCOPYRROLATE 0.2 MG/ML IJ SOLN
INTRAMUSCULAR | Status: DC | PRN
  Administered 2015-05-12: 0.4 mg via INTRAVENOUS

## 2015-05-12 MED ORDER — PROMETHAZINE HCL 25 MG/ML IJ SOLN: 6.2500 mg | INTRAMUSCULAR | Status: DC | PRN

## 2015-05-12 SURGICAL SUPPLY — 81 items
APPLICATOR TIP COSEAL (VASCULAR PRODUCTS) IMPLANT
APPLICATOR TIP EXT COSEAL (VASCULAR PRODUCTS) IMPLANT
BLADE SURG 11 STRL SS (BLADE) IMPLANT
BRUSH CYTOL CELLEBRITY 1.5X140 (MISCELLANEOUS) IMPLANT
BRUSH SCRUB EZ PLAIN DRY (MISCELLANEOUS) IMPLANT
CANISTER SUCTION 2500CC (MISCELLANEOUS) ×6 IMPLANT
CATH KIT ON Q 5IN SLV (PAIN MANAGEMENT) IMPLANT
CATH THORACIC 28FR (CATHETERS) ×3 IMPLANT
CATH THORACIC 36FR (CATHETERS) IMPLANT
CATH THORACIC 36FR RT ANG (CATHETERS) IMPLANT
CLEANER TIP ELECTROSURG 2X2 (MISCELLANEOUS) IMPLANT
CLIP TI MEDIUM 6 (CLIP) ×3 IMPLANT
CONT SPEC 4OZ CLIKSEAL STRL BL (MISCELLANEOUS) ×6 IMPLANT
COVER SURGICAL LIGHT HANDLE (MISCELLANEOUS) IMPLANT
COVER TABLE BACK 60X90 (DRAPES) ×3 IMPLANT
COVER TRANSDUCER ULTRASND GEL (DRAPE) IMPLANT
DERMABOND ADVANCED (GAUZE/BANDAGES/DRESSINGS) ×1
DERMABOND ADVANCED .7 DNX12 (GAUZE/BANDAGES/DRESSINGS) ×2 IMPLANT
DRAPE C-ARM 42X72 X-RAY (DRAPES) IMPLANT
DRAPE LAPAROSCOPIC ABDOMINAL (DRAPES) ×3 IMPLANT
DRAPE SLUSH/WARMER DISC (DRAPES) ×3 IMPLANT
DRAPE WARM FLUID 44X44 (DRAPE) ×3 IMPLANT
DRILL BIT 7/64X5 (BIT) IMPLANT
ELECT BLADE 4.0 EZ CLEAN MEGAD (MISCELLANEOUS) ×3
ELECT REM PT RETURN 9FT ADLT (ELECTROSURGICAL) ×3
ELECTRODE BLDE 4.0 EZ CLN MEGD (MISCELLANEOUS) ×2 IMPLANT
ELECTRODE REM PT RTRN 9FT ADLT (ELECTROSURGICAL) ×2 IMPLANT
FORCEPS BIOP RJ4 1.8 (CUTTING FORCEPS) IMPLANT
GAUZE SPONGE 4X4 12PLY STRL (GAUZE/BANDAGES/DRESSINGS) ×3 IMPLANT
GLOVE BIO SURGEON STRL SZ 6.5 (GLOVE) ×6 IMPLANT
GOWN STRL REUS W/ TWL LRG LVL3 (GOWN DISPOSABLE) ×8 IMPLANT
GOWN STRL REUS W/TWL LRG LVL3 (GOWN DISPOSABLE) ×4
KIT BASIN OR (CUSTOM PROCEDURE TRAY) ×3 IMPLANT
KIT CLEAN ENDO COMPLIANCE (KITS) ×3 IMPLANT
KIT PLEURX DRAIN CATH 1000ML (MISCELLANEOUS) IMPLANT
KIT PLEURX DRAIN CATH 15.5FR (DRAIN) ×3 IMPLANT
KIT ROOM TURNOVER OR (KITS) ×3 IMPLANT
KIT SUCTION CATH 14FR (SUCTIONS) ×3 IMPLANT
MARKER SKIN DUAL TIP RULER LAB (MISCELLANEOUS) IMPLANT
NEEDLE BIOPSY TRANSBRONCH 21G (NEEDLE) IMPLANT
NS IRRIG 1000ML POUR BTL (IV SOLUTION) ×6 IMPLANT
OIL SILICONE PENTAX (PARTS (SERVICE/REPAIRS)) ×3 IMPLANT
PACK CHEST (CUSTOM PROCEDURE TRAY) ×3 IMPLANT
PACK GENERAL/GYN (CUSTOM PROCEDURE TRAY) IMPLANT
PAD ARMBOARD 7.5X6 YLW CONV (MISCELLANEOUS) ×6 IMPLANT
PASSER SUT SWANSON 36MM LOOP (INSTRUMENTS) IMPLANT
SEALANT PROGEL (MISCELLANEOUS) IMPLANT
SEALANT SURG COSEAL 4ML (VASCULAR PRODUCTS) IMPLANT
SEALANT SURG COSEAL 8ML (VASCULAR PRODUCTS) IMPLANT
SET DRAINAGE LINE (MISCELLANEOUS) IMPLANT
SOLUTION ANTI FOG 6CC (MISCELLANEOUS) ×3 IMPLANT
SPONGE GAUZE 4X4 12PLY STER LF (GAUZE/BANDAGES/DRESSINGS) ×3 IMPLANT
SUT ETHILON 3 0 FSL (SUTURE) ×3 IMPLANT
SUT PROLENE 3 0 SH DA (SUTURE) IMPLANT
SUT PROLENE 4 0 RB 1 (SUTURE)
SUT PROLENE 4-0 RB1 .5 CRCL 36 (SUTURE) IMPLANT
SUT SILK  1 MH (SUTURE) ×2
SUT SILK 1 MH (SUTURE) ×4 IMPLANT
SUT SILK 2 0SH CR/8 30 (SUTURE) IMPLANT
SUT SILK 3 0SH CR/8 30 (SUTURE) IMPLANT
SUT VIC AB 1 CTX 18 (SUTURE) IMPLANT
SUT VIC AB 1 CTX 36 (SUTURE)
SUT VIC AB 1 CTX36XBRD ANBCTR (SUTURE) IMPLANT
SUT VIC AB 2-0 CTX 36 (SUTURE) IMPLANT
SUT VIC AB 3-0 X1 27 (SUTURE) ×6 IMPLANT
SUT VICRYL 2 TP 1 (SUTURE) IMPLANT
SWAB COLLECTION DEVICE MRSA (MISCELLANEOUS) IMPLANT
SYR 20ML ECCENTRIC (SYRINGE) ×3 IMPLANT
SYSTEM SAHARA CHEST DRAIN ATS (WOUND CARE) ×3 IMPLANT
TAPE CLOTH 4X10 WHT NS (GAUZE/BANDAGES/DRESSINGS) ×3 IMPLANT
TAPE CLOTH SURG 4X10 WHT LF (GAUZE/BANDAGES/DRESSINGS) ×3 IMPLANT
TIP APPLICATOR SPRAY EXTEND 16 (VASCULAR PRODUCTS) IMPLANT
TOWEL OR 17X24 6PK STRL BLUE (TOWEL DISPOSABLE) ×3 IMPLANT
TOWEL OR 17X26 10 PK STRL BLUE (TOWEL DISPOSABLE) ×6 IMPLANT
TRAP SPECIMEN MUCOUS 40CC (MISCELLANEOUS) ×12 IMPLANT
TRAY FOLEY CATH SILVER 16FR LF (SET/KITS/TRAYS/PACK) ×3 IMPLANT
TROCAR XCEL BLUNT TIP 100MML (ENDOMECHANICALS) IMPLANT
TUBE ANAEROBIC SPECIMEN COL (MISCELLANEOUS) IMPLANT
TUBE CONNECTING 20X1/4 (TUBING) ×6 IMPLANT
VALVE REPLACEMENT CAP (MISCELLANEOUS) IMPLANT
WATER STERILE IRR 1000ML POUR (IV SOLUTION) ×6 IMPLANT

## 2015-05-12 NOTE — Progress Notes (Signed)
Patient having thoracic pain at chest tube site s/p surgery. No pain PRNs ordered. Dr. Tyrone SageGerhardt notified. No new orders received. Patient taking methadone. Updated patient. Provided comfort measures. Will continue to monitor.

## 2015-05-12 NOTE — Progress Notes (Signed)
Utilization review complete. Ciera Beckum RN CCM Case Mgmt phone [phone] 

## 2015-05-12 NOTE — Progress Notes (Addendum)
[facility] TEAM 1 - Stepdown/ICU TEAM PROGRESS NOTE  Benjamin Bell ZOX:[id number] DOB: December 02, 1955 DOA: 05/08/2015 PCP: Mia Creek, MD  Admit HPI / Brief Narrative: 60 y/o M Hx COPD, recurrent L pleural effusion followed by Dr. Kendrick Fries who was brought to the [facility] ED via EMS w/ increasing shortness of breath. He was noted to have sats of 68% on RA. He was given albuterol, atrovent and solumedrol by EMS. A chest x-ray in the ED showed an increasing right sided pleural effusion.   HPI/Subjective: The pt is resting comfortably in bed.  He denies new problems, and states he is ready for the OR today.  He denies cp, sob, n/v, or abdom pain.  He is hungry.    Assessment/Plan:  Recurrent Lt Pleural effusion S/P thoracentesis - pleural fluid transudative - to OR today for VATS  Fall w/ rib fx, PTX, Hemothorax Jan 2017 eventually required chest tube   EtOH abuse/Hepatic cirrhosis cirrhotic liver noted on imaging - acute hepatitis panel pending - HIV pending   19x15x45mm R hepatic dome lesion  Needs Abdom MRI to eval once stable post-op   Polysubstance abuse   Hepatitis C s/p tx   Episodic BRBPR Appears to have been a self-limited mild issue   COPD Well compensated at this time - follow on current med tx   HTN BP currently reasonably controlled - follow   Chronic pain syndrome (neck pain) Methadone 50 mg daily - reports daily maintenance through [facility] [place] - follow QTc  MRSA screen +   Code Status: FULL Family Communication: no family present at time of exam Disposition Plan: SDU   Consultants: [facility]  TCTS  Procedures: 3/12 left thoracentesis > 1.6 L cloudy yellow fluid  Antibiotics: Ceftriaxone 3/14  DVT prophylaxis: SQ  heparin  Objective: Blood pressure 139/70, pulse 90, temperature 98.9 F (37.2 C), temperature source Oral, resp. rate 11, height  (1.803 m), weight 72.7 kg (160 lb 4.4 oz), SpO2 97 %.  Intake/Output Summary (Last 24  hours) at 05/12/15 1004 Last data filed at 05/12/15 0000  Gross per 24 hour  Intake    240 ml  Output   1050 ml  Net   -810 ml   Exam: General: No acute respiratory distress - alert Lungs: Clear to auscultation bilaterally without wheezes or crackles Cardiovascular: Regular rate and rhythm without murmur gallop or rub normal S1 and S2 Abdomen: Nontender, nondistended, soft, bowel sounds positive, no rebound, no ascites, no appreciable mass Extremities: No significant cyanosis, clubbing, or edema bilateral lower extremities  Data Reviewed:  Basic Metabolic Panel:  Recent Labs Lab 05/09/15 0517 05/10/15 0624 05/11/15 0335 05/11/15 2030 05/12/15 0540  NA 141 140 143 141 142  K 3.9 3.0* 3.9 4.5 4.1  CL 102 103 102 100* 101  CO2 33* 31  GLUCOSE 121* 95 86 150* 75  BUN 29* 30* 26* 22* 22*  CREATININE 1.19 1.22 1.16 1.10 0.99  CALCIUM 8.1* 7.9* 8.0* 8.2* 8.0*  MG  --   --  1.7  --  2.1  PHOS  --   --  1.8*  --  3.2    CBC:  Recent Labs Lab 05/08/15 1512 05/08/15 2204 05/09/15 0517 05/10/15 0624 05/11/15 2030 05/12/15 0540  WBC 6.7 5.0 4.1 5.9 6.8 6.9  NEUTROABS 5.2  --   --   --   --   --   HGB 13.1 12.2* 13.0 12.9* 13.9 12.8*  HCT 41.0 38.0*  40.6 41.2 43.3 40.4  MCV 92.3 91.8 91.2 92.4 92.1 91.8  PLT 189 164 176 186 171 158    Liver Function Tests:  Recent Labs Lab 05/08/15 1512 05/10/15 0624 05/11/15 2030  AST 54* 53* 60*  ALT ALKPHOS 190* 151* 175*  BILITOT 0.8 1.1 0.7  PROT 6.3* 5.9* 6.5  ALBUMIN 2.2* 1.8* 2.0*    Recent Labs Lab 05/08/15 1512  AMMONIA 45*    Coags:  Recent Labs Lab 05/08/15 1512 05/10/15 0624 05/11/15 2030  INR 1.44 1.42 1.31    Recent Labs Lab 05/11/15 2030  APTT 29    Recent Results (from the past 240 hour(s))  Blood culture (routine x 2)     Status: None (Preliminary result)   Collection Time: 05/08/15  4:40 PM  Result Value Ref Range Status   Specimen Description BLOOD RIGHT HAND   Final   Special Requests BOTTLES DRAWN AEROBIC AND ANAEROBIC 5CC  Final   Culture NO GROWTH 3 DAYS  Final   Report Status PENDING  Incomplete  Blood culture (routine x 2)     Status: None (Preliminary result)   Collection Time: 05/08/15  5:29 PM  Result Value Ref Range Status   Specimen Description BLOOD RIGHT FOREARM  Final   Special Requests BOTTLES DRAWN AEROBIC AND ANAEROBIC 5CC  Final   Culture NO GROWTH 3 DAYS  Final   Report Status PENDING  Incomplete  MRSA PCR Screening     Status: Abnormal   Collection Time: 05/09/15  7:38 AM  Result Value Ref Range Status   MRSA by PCR POSITIVE (A) NEGATIVE Final    Comment:        The GeneXpert MRSA Assay (FDA approved for NASAL specimens only), is one component of a comprehensive MRSA colonization surveillance program. It is not intended to diagnose MRSA infection nor to guide or monitor treatment for MRSA infections. RESULT CALLED TO, READ BACK BY AND VERIFIED WITH: A. MILLS RN 05/09/15 AT 0929 BY A. DAVIS   Culture, body fluid-bottle     Status: None (Preliminary result)   Collection Time: 05/09/15 11:47 AM  Result Value Ref Range Status   Specimen Description FLUID PLEURAL LEFT  Final   Special Requests NONE  Final   Culture NO GROWTH 2 DAYS  Final   Report Status PENDING  Incomplete  Gram stain     Status: None   Collection Time: 05/09/15 11:47 AM  Result Value Ref Range Status   Specimen Description FLUID PLEURAL LEFT  Final   Special Requests NONE  Final   Gram Stain   Final    MODERATE WBC PRESENT,BOTH PMN AND MONONUCLEAR NO ORGANISMS SEEN    Report Status 05/09/2015 FINAL  Final     Studies:   Recent x-ray studies have been reviewed in detail by the Attending Physician  Scheduled Meds:  Scheduled Meds: . antiseptic oral rinse  7 mL Mouth Rinse BID  . cefUROXime (ZINACEF)  IV  1.5 g Intravenous To SS-Surg  . Chlorhexidine Gluconate Cloth  6 each Topical Q0600  . furosemide  40 mg Oral BID  . heparin  5,000  Units Subcutaneous 3 times per day  . Influenza vac split quadrivalent PF  0.5 mL Intramuscular Once  . ipratropium-albuterol  3 mL Nebulization Q6H  . methadone  50 mg Oral Daily  . methylPREDNISolone (SOLU-MEDROL) injection  40 mg Intravenous Daily  . mometasone-formoterol  2 puff Inhalation BID  . mupirocin ointment  1  application Nasal BID  . pneumococcal 23 valent vaccine  0.5 mL Intramuscular Once  . spironolactone  25 mg Oral BID    Time spent on care of this patient: 35 mins   Emilly Lavey T , MD   [facility] Office  [phone] Pager - Text Page per Loretha StaplerAmion as per below:  On-Call/Text Page:      [email or web address]      password [email or web address]  If 7PM-7AM, please contact night-coverage [email or web address] Password [email or web address] 05/12/2015, 10:04 AM   LOS: 4 days

## 2015-05-12 NOTE — Progress Notes (Signed)
PULMONARY / CRITICAL CARE MEDICINE   Name: Benjamin Bell MRN: [id number] DOB: 1955-10-17    ADMISSION DATE:  05/08/2015 CONSULTATION DATE:  05/08/15  REFERRING MD:  ED physician  CHIEF COMPLAINT:  Shortness of breath  HISTORY OF PRESENT ILLNESS:   Benjamin Bell is a 60 y/o man with past medical history significant for COPD, cirrhosis and recurrent left sided pleural effusion.  He is followed by Dr. Kendrick FriesMcQuaid for this.  Hosp January, 2017 for traumatic left hemopneumothorax with rib fractures. After chest tube removal, he continued drainage from chest tube site, described as projectile across room if he coughed. Since site finally sealed over, he has noted gradually increasing dyspnea.  [facility] after calling and complaining of increasing shortness of breath.  He was noted to have sats of 68% on RA.  He was given albuterol, atrovent and solumedrol by EMS.  A chest x-ray done in the ED showed increasing left sided pleural effusion. States he no longer drinks ETOH. 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  SUBJECTIVE:  Some heme in stool yesterday but seen by GI and no interventions.  VITAL SIGNS: BP 139/70 mmHg  Pulse 90  Temp(Src) 98.9 F (37.2 C) (Oral)  Resp 11  Ht 5\' 11"  (1.803 m)  Wt 72.7 kg (160 lb 4.4 oz)  BMI 22.36 kg/m2  SpO2 97%  HEMODYNAMICS:    VENTILATOR SETTINGS:    INTAKE / OUTPUT: I/O last 3 completed shifts: In: 660 [P.O.:360; IV Piggyback:300] Out: 2300 [Urine:2300]  PHYSICAL EXAMINATION: General: Awake, polite, cooperative, NAD Neuro:  Oriented x 3, gross vision and hearing intact, moves all extremities, non-lateralizing, no tremor  HEENT:  PERRL, EOMI, OP clear Cardiovascular:  NRRR, no MRG Lungs:  Unlabored on O2,  prolonged expiratory phase, diminished breath sounds on left, dull, no rub Abdomen:  Softly distended, NTND, +BS, no palpable  HSM, question ascites Musculoskeletal:  No joint abnormalities, 2+ edema from mid shin to ankles Skin:  No rashes, bruises or other lesions.   LABS:  BMET  Recent Labs Lab 05/11/15 0335 05/11/15 2030 05/12/15 0540  NA 143 141 142  K 3.9 4.5 4.1  CL 102 100* 101  CO2 29 33* 31  BUN 26* 22* 22*  CREATININE 1.16 1.10 0.99  GLUCOSE 86 150* 75    Electrolytes  Recent Labs Lab 05/11/15 0335 05/11/15 2030 05/12/15 0540  CALCIUM 8.0* 8.2* 8.0*  MG 1.7  --  2.1  PHOS 1.8*  --  3.2    CBC  Recent Labs Lab 05/10/15 0624 05/11/15 2030 05/12/15 0540  WBC 5.9 6.8 6.9  HGB 12.9* 13.9 12.8*  HCT 41.2 43.3 40.4  PLT 186 171 158    Coag's  Recent Labs Lab 05/08/15 1512 05/10/15 0624 05/11/15 2030  APTT  --   --  29  INR 1.44 1.42 1.31    Sepsis Markers  Recent Labs Lab 05/08/15 1534 05/08/15 1740  LATICACIDVEN 2.15* 1.08    ABG  Recent Labs Lab 05/08/15 1805 05/08/15 2118 05/11/15 2042  PHART 7.354 7.380 7.475*  PCO2ART 55.2* 52.6* 44.2  PO2ART 61.0* 69.0* 63.3*    Liver Enzymes  Recent Labs Lab 05/08/15 1512 05/10/15 0624 05/11/15 2030  AST 54* 53* 60*  ALT 22 20 26   ALKPHOS 190* 151* 175*  BILITOT 0.8 1.1 0.7  ALBUMIN 2.2* 1.8* 2.0*  Cardiac Enzymes No results for input(s): TROPONINI, PROBNP in the last 168 hours.  Glucose No results for input(s): GLUCAP in the last 168 hours.  Imaging US Abdomen Complete  05/11/2015  CLINICAL DATA:  Cirrhosis with ascites, abdominal distention EXAM: ABDOMEN ULTRASOUND COMPLETE COMPARISON:  03/18/2015, 03/07/2015 FINDINGS: Gallbladder: Moderate gallbladder distention with a small echogenic non shadowing mobile focus measuring 10 mm in the fundus, suspect gallbladder sludge. No definite shadowing gallstones. Wall thickness is mildly thickened measuring 4.1 mm. Trace pericholecystic fluid. No Murphy sign elicited. Appearance of the gallbladder is likely secondary to chronic hepatic disease or  cirrhosis. Common bile duct: Diameter: Diffusely dilated up to 13 mm but similar to the prior CT 03/07/2015 no intrahepatic biliary dilatation. Liver: Mildly nodular liver surface compatible with hepatic cirrhosis. Enlargement of the left hepatic lobe noted. Heterogeneous hypoechoic hepatic lesion in the right hepatic dome measuring 19 x 15 x 22 mm, indeterminate by ultrasound. In the setting of cirrhosis, recommend further evaluation with nonemergent abdominal MRI. IVC: No abnormality visualized. Pancreas: Visualized portion unremarkable. Pancreatic duct is demonstrated measuring 2.6 mm. No change compared to the prior CT. Spleen: Size and appearance within normal limits. Right Kidney: Length: 11.8 cm. Small hypoechoic renal cysts throughout the right kidney 1 measuring 6 mm and a second measuring 13 mm. No renal obstruction or hydronephrosis. Left Kidney: Length: 12.6 mm. Echogenic shadowing stone measuring 18 mm in the upper pole correlating with the CT. No hydronephrosis. Mild cortical thinning. Abdominal aorta: Aortic atherosclerosis noted. Negative for aneurysm. Distal aorta is obscured by bowel gas. Other findings: Small amount of perihepatic ascites. IMPRESSION: Evidence of hepatic cirrhosis and trace amount of perihepatic ascites. Right hepatic dome 2.2 cm hypoechoic solid lesion by ultrasound remains indeterminate. See above comment and recommendation for further evaluation non emergently. Chronic common bile duct dilatation. Moderately distended gallbladder with mild wall thickening and pericholecystic fluid and intraluminal mobile echogenic sludge. No Murphy sign or definite cholecystitis. Gallbladder appearance is likely secondary to chronic hepatic disease/hypoproteinemia. Incidental right renal cysts Left upper pole nephrolithiasis Aortic atherosclerosis Electronically Signed   By: Judie Petit.  Shick M.D.   On: 05/11/2015 18:36     STUDIES:  CXR 3/11 - increasing right pleural  effusion  CULTURES: Blood  ANTIBIOTICS: Vancomycin 3/11 >>> Zosyn 3/11 >>>  SIGNIFICANT EVENTS:   LINES/TUBES:  I reviewed CXR myself, no evidence of PTX and large left sided effusion.  DISCUSSION: 60 y/o with COPD exacerbation and recurrent right pleural effusion. Will likely need chest tube with possible pleuradesis.   ASSESSMENT / PLAN:  PULMONARY A: COPD exacerbation, Pleural effusion. Hx traumatic L hydropneumothorax Jan, 2017 with continued drainage until chest tube site healed over. Question how much may be ascites fluid. P:   Nebs Solumedrol 60 mg IV daily, decrease to 40 daily x2 days then 20 daily x2 days then d/c. To OR today for bronchoscopy, pleural biopsy and pleurex catheter placement. Titrate O2 for sat of 88-92%. Follow effusion with diuresis. Thoracentesis done, will check CXR in AM for rate of reaccumulation to determine pleurodesis vs pleurex catheter placement. Thoracic surgery consulted, abdominal U/S today then will determine if a left sided VATS is necessary and pleurodesis or drain placement. Pleural fluid transudative as expected.  CARDIOVASCULAR A:  HTN, edema P:  Lasix 40 mg PO BID. Aldactone 25 mg PO BID  RENAL A:   Hypokalemia, mag and phos P:   Replete electrolytes as indicated. Lasix 40 po BID Aldactone increase to 25 BID. BMET in AM.  INFECTIOUS A:   Pleural effusion.  P:   Cultures negative, WBC normal, will d/c abx.  Methadone maintenance, had recently called our office seeking oxycodone for neck pain Reports daily maintenance through [facility] [place] P:  Resume stated maintenance dose 51 mg po once daily  Hepatic cirrhosis Previous ETOH abuse Transaminitis P- follow LFTs intermittently  FAMILY  - Updates: Patient updated bedside.  - Inter-disciplinary family meet or Palliative Care meeting due by:  day 7  Discussed with TR[facility]-MD.  Alyson Reedy, M.D. [facility] Pulmonary/Critical Care Medicine. Pager:  [phone]. After hours pager: [phone].  05/12/2015, 11:35 AM

## 2015-05-12 NOTE — Transfer of Care (Signed)
Immediate Anesthesia Transfer of Care Note  Patient: Benjamin Bell  Procedure(s) Performed: Procedure(s): VIDEO ASSISTED THORACOSCOPY (Left) DRAINAGE OF PLEURAL EFFUSION, MECHANICAL PLEURALDESIS, PLEURAL BIOPSIES (Left) VIDEO BRONCHOSCOPY (N/A) INSERTION PLEURAX CATH (N/A)  Patient Location: PACU  Anesthesia Type:General  Level of Consciousness: awake, alert , oriented and patient cooperative  Airway & Oxygen Therapy: Patient Spontanous Breathing and Patient connected to nasal cannula oxygen  Post-op Assessment: Report given to RN and Post -op Vital signs reviewed and stable  Post vital signs: Reviewed and stable  Last Vitals:  Filed Vitals:   05/12/15 1505 05/12/15 1510  BP: 137/73 140/74  Pulse: 90 96  Temp:    Resp: 14 14    Complications: No apparent anesthesia complications

## 2015-05-12 NOTE — Brief Op Note (Addendum)
      [address]       [place] [id number]             [phone]       05/12/2015  5:49 PM  PATIENT:  Benjamin Bell  60 y.o. male  PRE-OPERATIVE DIAGNOSIS:  LEFT PLEURAL EFFUSION  POST-OPERATIVE DIAGNOSIS:  LEFT PLEURAL EFFUSION  PROCEDURE:  Procedure(s): VIDEO ASSISTED THORACOSCOPY (Left) DRAINAGE OF PLEURAL EFFUSION, MECHANICAL PLEURALDESIS, PLEURAL BIOPSIES (Left) VIDEO BRONCHOSCOPY (N/A) INSERTION PLEURAX CATH (N/A)  SURGEON:  Surgeon(s) and Role:    * Delight OvensEdward B Meleni Delahunt, MD - Primary  PHYSICIAN ASSISTANT: WAYNE GOLD PA-C  ANESTHESIA:   general  EBL:  Total I/O In: -  Out: 825 [Urine:825]  BLOOD ADMINISTERED:none  DRAINS: 1 28 F Chest Tube(s) in the LEFT HEMITHORAX and 1 PLEURX CATHETER    LOCAL MEDICATIONS USED:  NONE  SPECIMEN:  Source of Specimen:  PLEURAL EFFUSION AND PLEURA FOR BX, CYTOLOGY AND CULTURES  DISPOSITION OF SPECIMEN:  PATHOLOGY AND MICRO  COUNTS:  YES   DICTATION: .Other Dictation: Dictation Number PENDING  PLAN OF CARE: Admit to inpatient   PATIENT DISPOSITION:  PACU - hemodynamically stable.   Delay start of Pharmacological VTE agent (>24hrs) due to surgical blood loss or risk of bleeding: yes

## 2015-05-12 NOTE — Anesthesia Procedure Notes (Addendum)
Central Venous Catheter Insertion Performed by: anesthesiologist 05/12/2015 2:52 PM Patient location: Pre-op. Preanesthetic checklist: patient identified, IV checked, site marked, risks and benefits discussed, surgical consent, monitors and equipment checked, pre-op evaluation, timeout performed and anesthesia consent Position: Trendelenburg Lidocaine 1% used for infiltration Landmarks identified and Seldinger technique used Catheter size: 8 Fr Central line was placed.Double lumen Procedure performed using ultrasound guided technique. Attempts: 1 Following insertion, dressing applied, line sutured and Biopatch. Post procedure assessment: blood return through all ports. Patient tolerated the procedure well with no immediate complications.   Procedure Name: Intubation Date/Time: 05/12/2015 4:23 PM Performed by: Jeani HawkingBERRY, Awab Abebe J Pre-anesthesia Checklist: Patient identified, Suction available, Patient being monitored and Emergency Drugs available Patient Re-evaluated:Patient Re-evaluated prior to inductionOxygen Delivery Method: Circle system utilized Preoxygenation: Pre-oxygenation with 100% oxygen Intubation Type: IV induction Ventilation: Mask ventilation without difficulty Laryngoscope Size: Mac and 4 Grade View: Grade I Tube type: Oral Tube size: 8.0 mm Number of attempts: 1 Airway Equipment and Method: Stylet Placement Confirmation: ETT inserted through vocal cords under direct vision,  positive ETCO2 and breath sounds checked- equal and bilateral Dental Injury: Teeth and Oropharynx as per pre-operative assessment

## 2015-05-12 NOTE — Anesthesia Preprocedure Evaluation (Signed)
Anesthesia Evaluation  Patient identified by MRN, date of birth, ID band Patient awake    Reviewed: Allergy & Precautions, NPO status , Patient's Chart, lab work & pertinent test results  Airway Mallampati: I  TM Distance: >3 FB Neck ROM: Full    Dental  (+) Edentulous Upper, Edentulous Lower   Pulmonary COPD,  COPD inhaler, Current Smoker,     + decreased breath sounds      Cardiovascular hypertension, Pt. on medications + Peripheral Vascular Disease   Rhythm:Regular Rate:Normal     Neuro/Psych negative neurological ROS  negative psych ROS   GI/Hepatic negative GI ROS, Neg liver ROS,   Endo/Other  negative endocrine ROS  Renal/GU ARFRenal diseasenegative Renal ROS  negative genitourinary   Musculoskeletal negative musculoskeletal ROS (+)   Abdominal   Peds negative pediatric ROS (+)  Hematology   Anesthesia Other Findings - AAA  Reproductive/Obstetrics negative OB ROS                             Lab Results  Component Value Date   WBC 6.9 05/12/2015   HGB 12.8* 05/12/2015   HCT 40.4 05/12/2015   MCV 91.8 05/12/2015   PLT 158 05/12/2015   Lab Results  Component Value Date   CREATININE 0.99 05/12/2015   BUN 22* 05/12/2015   NA 142 05/12/2015   K 4.1 05/12/2015   CL 101 05/12/2015   CO2 31 05/12/2015   Lab Results  Component Value Date   INR 1.31 05/11/2015   INR 1.42 05/10/2015   INR 1.44 05/08/2015   04/2015 EKG: normal sinus rhythm.  02/2015 Echo - Left ventricle: The cavity size was normal. Systolic function was normal. The estimated ejection fraction was in the range of 60% to 65%. Regional wall motion abnormalities cannot be excluded. - Left atrium: The atrium was mildly dilated.    Anesthesia Physical Anesthesia Plan  ASA: III  Anesthesia Plan: General   Post-op Pain Management:    Induction: Intravenous  Airway Management Planned: Oral ETT and Double  Lumen EBT  Additional Equipment: Arterial line, CVP and Ultrasound Guidance Line Placement  Intra-op Plan:   Post-operative Plan: Extubation in OR  Informed Consent: I have reviewed the patients History and Physical, chart, labs and discussed the procedure including the risks, benefits and alternatives for the proposed anesthesia with the patient or authorized representative who has indicated his/her understanding and acceptance.   Dental advisory given  Plan Discussed with: CRNA  Anesthesia Plan Comments:         Anesthesia Quick Evaluation

## 2015-05-13 ENCOUNTER — Encounter (HOSPITAL_COMMUNITY): Payer: Self-pay | Admitting: Cardiothoracic Surgery

## 2015-05-13 ENCOUNTER — Inpatient Hospital Stay (HOSPITAL_COMMUNITY): Payer: Medicaid Other

## 2015-05-13 DIAGNOSIS — F1010 Alcohol abuse, uncomplicated: Secondary | ICD-10-CM | POA: Insufficient documentation

## 2015-05-13 DIAGNOSIS — J90 Pleural effusion, not elsewhere classified: Secondary | ICD-10-CM

## 2015-05-13 DIAGNOSIS — G894 Chronic pain syndrome: Secondary | ICD-10-CM | POA: Insufficient documentation

## 2015-05-13 LAB — CULTURE, BLOOD (ROUTINE X 2)
CULTURE: NO GROWTH
Culture: NO GROWTH

## 2015-05-13 LAB — COMPREHENSIVE METABOLIC PANEL
ALT: 22 U/L (ref 17–63)
AST: 50 U/L — AB (ref 15–41)
Albumin: 1.8 g/dL — ABNORMAL LOW (ref 3.5–5.0)
Alkaline Phosphatase: 130 U/L — ABNORMAL HIGH (ref 38–126)
Anion gap: 7 (ref 5–15)
BUN: 22 mg/dL — AB (ref 6–20)
CHLORIDE: 100 mmol/L — AB (ref 101–111)
CO2: 33 mmol/L — AB (ref 22–32)
Calcium: 8.1 mg/dL — ABNORMAL LOW (ref 8.9–10.3)
Creatinine, Ser: 1.04 mg/dL (ref 0.61–1.24)
Glucose, Bld: 60 mg/dL — ABNORMAL LOW (ref 65–99)
POTASSIUM: 4.7 mmol/L (ref 3.5–5.1)
SODIUM: 140 mmol/L (ref 135–145)
Total Bilirubin: 0.9 mg/dL (ref 0.3–1.2)
Total Protein: 5.3 g/dL — ABNORMAL LOW (ref 6.5–8.1)

## 2015-05-13 LAB — PROTIME-INR
INR: 1.55 — ABNORMAL HIGH (ref 0.00–1.49)
Prothrombin Time: 18.6 seconds — ABNORMAL HIGH (ref 11.6–15.2)

## 2015-05-13 LAB — CBC
HEMATOCRIT: 40.3 % (ref 39.0–52.0)
Hemoglobin: 12.3 g/dL — ABNORMAL LOW (ref 13.0–17.0)
MCH: 28.5 pg (ref 26.0–34.0)
MCHC: 30.5 g/dL (ref 30.0–36.0)
MCV: 93.3 fL (ref 78.0–100.0)
Platelets: 177 10*3/uL (ref 150–400)
RBC: 4.32 MIL/uL (ref 4.22–5.81)
RDW: 16.1 % — ABNORMAL HIGH (ref 11.5–15.5)
WBC: 10.4 10*3/uL (ref 4.0–10.5)

## 2015-05-13 LAB — HEPATITIS PANEL, ACUTE
HCV Ab: >11 s/co ratio — ABNORMAL HIGH (ref 0.0–0.9)
HEP A IGM: NEGATIVE
Hep B C IgM: NEGATIVE
Hepatitis B Surface Ag: NEGATIVE

## 2015-05-13 MED ORDER — KETOROLAC TROMETHAMINE 30 MG/ML IJ SOLN
30.0000 mg | Freq: Four times a day (QID) | INTRAMUSCULAR | Status: DC | PRN
  Administered 2015-05-13 (×3): 30 mg via INTRAVENOUS
  Filled 2015-05-13 (×3): qty 1

## 2015-05-13 NOTE — Progress Notes (Signed)
PULMONARY / CRITICAL CARE MEDICINE   Name: Benjamin Bell MRN: [id number] DOB: 09/09/1955    ADMISSION DATE:  05/08/2015 CONSULTATION DATE:  05/08/15  REFERRING MD:  ED physician  CHIEF COMPLAINT:  Shortness of breath  Discussion: [place] 60 year old gentleman with PMH significant for left  Traumatic hemopneumothorax,COPD, cirrhosis now presents with recurrent pleural effusion. He is followed by Dr. Kendrick Fries for this. . SUBJECTIVE:  Patient says sore from the chest tube,breathing overall has improved. Marland Kitchen VITAL SIGNS: BP 123/74 mmHg  Pulse 108  Temp(Src) 99.2 F (37.3 C) (Oral)  Resp 11  Ht  (1.803 m)  Wt 157 lb 3 oz (71.3 kg)  BMI 21.93 kg/m2  SpO2 92%  HEMODYNAMICS:    VENTILATOR SETTINGS:    INTAKE / OUTPUT: I/O last 3 completed shifts: In: 2315 [P.O.:360; I.V.:1905; IV Piggyback:50] Out: 2525 [Urine:2100; Blood:75; Chest Tube:350]  PHYSICAL EXAMINATION: General: weak,ill appearing male , in no acute distress HEENT:  Atraumatic, normocephalic,no discharge noted, sclera white Cardiovascular:  S1S2 heart sounds, rrr, No MRG noted Lungs: Diminished breath sounds left lower lobe, clear otherwise. Abdomen:  Firm, non tender, good bowel sounds Musculoskeletal:  No joint abnormalities, Skin:  No rashes, bruises, ecchymosis , lesions noted  LABS:  BMET  Recent Labs Lab 05/11/15 2030 05/12/15 0540 05/13/15 0420  NA 141 142 140  K 4.5 4.1 4.7  CL 100* 101 100*  CO2 33* 31 33*  BUN 22* 22* 22*  CREATININE 1.10 0.99 1.04  GLUCOSE 150* 75 60*    Electrolytes  Recent Labs Lab 05/11/15 0335 05/11/15 2030 05/12/15 0540 05/13/15 0420  CALCIUM 8.0* 8.2* 8.0* 8.1*  MG 1.7  --  2.1  --   PHOS 1.8*  --  3.2  --     CBC  Recent Labs Lab 05/11/15 2030 05/12/15 0540 05/13/15 0420  WBC 6.8 6.9 10.4  HGB 13.9 12.8* 12.3*  HCT 43.3 40.4 40.3  PLT 171 158 177    Coag's  Recent Labs Lab 05/10/15 0624 05/11/15 2030 05/13/15 0420  APTT  --  29  --    INR 1.42 1.31 1.55*    Sepsis Markers  Recent Labs Lab 05/08/15 1534 05/08/15 1740  LATICACIDVEN 2.15* 1.08    ABG  Recent Labs Lab 05/08/15 1805 05/08/15 2118 05/11/15 2042  PHART 7.354 7.380 7.475*  PCO2ART 55.2* 52.6* 44.2  PO2ART 61.0* 69.0* 63.3*    Liver Enzymes  Recent Labs Lab 05/10/15 0624 05/11/15 2030 05/13/15 0420  AST 53* 60* 50*  ALT ALKPHOS 151* 175* 130*  BILITOT 1.1 0.7 0.9  ALBUMIN 1.8* 2.0* 1.8*    Cardiac Enzymes No results for input(s): TROPONINI, PROBNP in the last 168 hours.  Glucose No results for input(s): GLUCAP in the last 168 hours.  Imaging Dg Chest Port 1 View  05/13/2015  CLINICAL DATA:  Pleural effusion and left chest tube EXAM: PORTABLE CHEST 1 VIEW COMPARISON:  05/12/2015 FINDINGS: Left chest tube remains unchanged in position. No pneumothorax. Minimal residual left pleural effusion unchanged. Left lower lobe airspace disease unchanged. Mild right lower lobe airspace disease improved. Right jugular catheter tip in the SVC. Improvement in vascular congestion IMPRESSION: Left chest tube remains in good position with minimal left effusion. No pneumothorax. Left lower lobe airspace disease unchanged Improvement in vascular congestion. Improvement in right lower lobe airspace disease. Electronically Signed   By: Marlan Palau M.D.   On: 05/13/2015 07:49   Dg Chest Port 1 View  05/12/2015  CLINICAL DATA:  Pleural effusion, status post chest tube placement. EXAM: PORTABLE CHEST 1 VIEW COMPARISON:  Chest x-rays dated 05/11/2015 and 05/09/2015. FINDINGS: Significantly decreased left pleural effusion status post left-sided chest tube placement, with improved aeration. Opacity persists at the left lung base, most likely a combination of atelectasis and small pleural effusion. Ill-defined opacities within the mid to lower right lung are most suggestive of mild edema. Cardiomediastinal silhouette appears stable in size and  configuration. Right IJ central line has been placed with tip well positioned at the level of the lower SVC. No pneumothorax seen. IMPRESSION: 1. Significant decrease in the size of the left pleural effusion status post chest tube placement. Probable small residual pleural effusion and atelectasis at the left lung base. 2. Ill-defined opacities within the mid to lower right lung most suggestive of mild edema. 3. Right IJ central line placed in the interval with tip well-positioned at the level of the lower SVC. No pneumothorax or other procedural complicating feature. Electronically Signed   By: Bary RichardStan  Maynard M.D.   On: 05/12/2015 18:54    ASSESSMENT / PLAN:  PULMONARY A: COPD exacerbation, Pleural effusion. Hx traumatic L hydropneumothorax Jan, 2017 with continued drainage until chest tube site healed over. Question how much may be ascites fluid. P:   Continue  Albuterol/Spiriva/dulera Chest tube placed (VATS)- 3/15 by CT surgeon  Titrate O2 for sat of 88-92%. Follow effusion with diuresis. Small Effusion unchanged(3/16) no air leak Follow CXR in am  CARDIOVASCULAR A:  HTN, edema P:   continue Lasix 40 mg PO BID.  continue Aldactone 25 mg PO BID  RENAL A:   AKI probably due to Lasix Hypoprotenemia P:   Trend BUN, Cr in am Replete electrolytes as indicated.  INFECTIOUS A:   Pleural effusion.  P:   Cultures negative this far,  WBC normal,  Methadone maintenance, had recently called our office seeking oxycodone for neck pain Reports daily maintenance through [facility] [place] P:  Continue 50 mg of methadone( stated maintenance dose)  Hepatic cirrhosis Previous ETOH abuse Transaminitis P-  Follow ALT, AST intermittently    Theodora Lalanne,AG-ACNP Pulmonary & Critical Care

## 2015-05-13 NOTE — Progress Notes (Signed)
Inpatient Diabetes Program Recommendations  AACE/ADA: New Consensus Statement on Inpatient Glycemic Control (2015)  Target Ranges:  Prepandial:   less than 140 mg/dL      Peak postprandial:   less than 180 mg/dL (1-2 hours)      Critically ill patients:  140 - 180 mg/dL   Results for [name], Benjamin (MRN [id number]) as of 05/13/2015 12:41  Ref. Range 05/12/2015 04:00 05/12/2015 05:40 05/12/2015 17:14 05/12/2015 17:51 05/12/2015 17:55 05/12/2015 17:56 05/13/2015 04:20  Glucose Latest Ref Range: 65-99 mg/dL  75     60 (L)    Inpatient Diabetes Program Recommendations:  Low lab glucose.  No history of diabetes- consider checking fingerstick blood sugars tid and hs.  Susette RacerJulie Aniaya Bacha, RN, BA, MHA, CDE Diabetes Coordinator Inpatient Diabetes Program  [phone] (Team Pager) [phone] [facility] Office) 05/13/2015 12:44 PM

## 2015-05-13 NOTE — Progress Notes (Addendum)
[address]       [place] [address]             [phone]       1 Day Post-Op Procedure(s) (LRB): VIDEO ASSISTED THORACOSCOPY (Left) DRAINAGE OF PLEURAL EFFUSION, MECHANICAL PLEURALDESIS, PLEURAL BIOPSIES (Left) VIDEO BRONCHOSCOPY (N/A) INSERTION PLEURAX CATH (N/A)  Subjective: Patient states his breathing is better  Objective: Vital signs in last 24 hours: Temp:  [97.6 F (36.4 C)-99.3 F (37.4 C)] 99.2 F (37.3 C) (03/16 0840) Pulse Rate:  [86-117] 108 (03/16 0840) Cardiac Rhythm:  [-] Normal sinus rhythm (03/16 0844) Resp:  [10-22] 11 (03/16 0840) BP: (105-159)/(71-92) 123/74 mmHg (03/16 0840) SpO2:  [89 %-100 %] 92 % (03/16 0840) Arterial Line BP: (133-175)/(60-76) 147/64 mmHg (03/16 0840) Weight:  [157 lb 3 oz (71.3 kg)] 157 lb 3 oz (71.3 kg) (03/16 0600)      Intake/Output from previous day: 03/15 0701 - 03/16 0700 In: 2075 [P.O.:120; I.V.:1905; IV Piggyback:50] Out: 2025 [Urine:1600; Blood:75; Chest Tube:350]   Physical Exam:  Cardiovascular: Slightly tachycardic. Pulmonary: Slightly diminished right base and left lung is clear Abdomen: Soft, non tender, bowel sounds present. Extremities: Mild bilateral lower extremity edema. Wounds: Dressing is clean and dry.   Chest Tube: to suction, no air leak  Lab Results: CBC: Recent Labs  05/12/15 0540 05/13/15 0420  WBC 6.9 10.4  HGB 12.8* 12.3*  HCT 40.4 40.3  PLT 158 177   BMET:  Recent Labs  05/12/15 0540 05/13/15 0420  NA 142 140  K 4.1 4.7  CL 101 100*  CO2 31 33*  GLUCOSE 75 60*  BUN 22* 22*  CREATININE 0.99 1.04  CALCIUM 8.0* 8.1*    PT/INR:  Recent Labs  05/13/15 0420  LABPROT 18.6*  INR 1.55*   ABG:  INR: Will add last result for INR, ABG once components are confirmed Will add last 4 CBG results once components are confirmed  Assessment/Plan:  1. CV - ST this am. On Spironolactone 25 mg bid. Per medicine 2.  Pulmonary - Continue Spiriva. Chest  tube with 420 cc since surgery (70 cc last 12 hours). There is no air leak. CXR this am shows small left pleural effusion, no pneumothorax. Check CXR in am. Encourage incentive spirometer 3. INR up to 1.55. Has mildly elevated AST and ALK PHOS. Has cirrhosis 4. Decrease IVF 5. Remove A line 6. Will remove foley in am  ZIMMERMAN,DONIELLE MPA-C 05/13/2015,9:13 AM  I have seen and examined Benjamin Bell and agree with the above assessment  and plan.  Grace Isaac MD Beeper [phone] Office [phone] 05/13/2015 5:47 PM

## 2015-05-13 NOTE — Progress Notes (Signed)
Early TEAM 1 - Stepdown/ICU TEAM Progress Note  Benjamin Bell ZOX:[id number] DOB: 08/16/1955 DOA: 05/08/2015 PCP: No primary care provider on file.  Admit HPI / Brief Narrative: Benjamin Bell is a 60 y/o WM PMHx COPD,Recurrent left sided pleural effusion. He is followed by Dr. Kendrick Fries for this. He was brought to the [facility] ED via EMS after calling and complaining of increasing shortness of breath. He was noted to have sats of 68% on RA. He was given albuterol, atrovent and solumedrol by EMS. A chest x-ray done in the ED showed increasing right sided pleural effusion.   HPI/Subjective: 3/16 A/O 4, left side chest tube and Pleurx cath in place. First time was secondary to trauma, this time spontaneously. On 2 L O2 via [place] at home  Assessment/Plan: Lt Pleural effusion. -Left side pleural effusion. Previous Lt Hydropneumothorax Jan, 2017 with continued drainage until chest tube site healed over.  -S/P thoracentesis NGTD -S/P left VATS with Pleurx cath insertion  -Pleural fluid transudative as expected.  COPD -Albuterol nebs PRN -DuoNeb QID -Solu-Medrol 40 mg daily -Dulera BID  HTN/ Edema -Lasix 40 mg BID -Spironolactone 25 mg BID  Chronic pain syndrome (neck pain) -Methadone 50 mg daily -Reports daily maintenance through [facility]  -Toradol QID PRN  EtOH abuse/Hepatic cirrhosis -Patient with cirrhotic liver see abdominal echo below  -Liver lesion 2.2 cm requiring abdominal MRI -Acute hepatitis panel pending -HIV pending     Code Status: FULL Family Communication: no family present at time of exam Disposition Plan: Resolution left lower effusion    Consultants: Dr.Wesam Santa Genera PC CM Dr.John Barrett Shell GI Dr.Edward B Gerhardt cardiothoracic surgery   Procedure/Significant Events: 3/12 left thoracentesis: 1.6 L cloudy yellow fluid 3/14 PCXR;-Large left pleural effusion, slightly increased in size  -New mild interstitial and airspace pulmonary edema  indicating CHFand/or fluid overload. 3/14 abdominal ultrasound;- hepatic cirrhosis and trace amount of perihepatic ascites. -Right hepatic dome 2.2 cm hypoechoic solid lesion  -Chronic common bile duct dilatation.-Moderately distended gallbladder with mild wall thickening and pericholecystic fluid and intraluminal mobile echogenic sludge. Gallbladder appearance 2dary chronic hepatic disease/hypoproteinemia. -Incidental right renal cysts--Left upper pole nephrolithiasis 3/15 left VATS, with insertion Pleurx cath   Culture 3/11 blood right hand/forearm NGTD 3/12 MRSA positive  3/12 left pleural fluid NGTD   Antibiotics: Cefuroxime 3/14 1 dose  DVT prophylaxis: Subcutaneous heparin   Devices    LINES / TUBES:      Continuous Infusions: . lactated ringers 10 mL/hr at 05/12/15 1950    Objective: VITAL SIGNS: Temp: 98.8 F (37.1 C) (03/16 2003) Temp Source: Oral (03/16 2003) BP: 112/66 mmHg (03/16 2003) Pulse Rate: 97 (03/16 2003) SPO2; FIO2:   Intake/Output Summary (Last 24 hours) at 05/13/15 2107 Last data filed at 05/13/15 1752  Gross per 24 hour  Intake    320 ml  Output   1065 ml  Net   -745 ml     Exam: General: A/O 4, chronic Respiratory distress (on home setting of 2 L via [place])  Eyes: Negative headache, negative scleral hemorrhage ENT: Negative Runny nose, negative gingival bleeding, Neck:  Negative scars, masses, torticollis, lymphadenopathy, JVD Lungs: Clear to auscultation LUL/lingula, decrease breath sounds LLL, diffuse wheezing all right lung fields, basilar crackles RLL  Cardiovascular: Regular rate and rhythm without murmur gallop or rub normal S1 and S2 Abdomen:negative abdominal pain, distended, positive soft, bowel sounds, no rebound, no ascites, no appreciable mass Extremities: No significant cyanosis, clubbing, or edema bilateral lower extremities Psychiatric:  Negative depression, negative anxiety, negative fatigue, negative mania    Neurologic:  Cranial nerves II through XII intact, tongue/uvula midline, all extremities muscle strength 5/5, sensation intact throughout,negative dysarthria, negative expressive aphasia, negative receptive aphasia.   Data Reviewed: Basic Metabolic Panel:  Recent Labs Lab 05/10/15 0624 05/11/15 0335 05/11/15 2030 05/12/15 0540 05/13/15 0420  NA 140 143 141 142 140  K 3.0* 3.9 4.5 4.1 4.7  CL 103 102 100* 101 100*  CO2 29 29 33* 31 33*  GLUCOSE 95 86 150* 75 60*  BUN 30* 26* 22* 22* 22*  CREATININE 1.22 1.16 1.10 0.99 1.04  CALCIUM 7.9* 8.0* 8.2* 8.0* 8.1*  MG  --  1.7  --  2.1  --   PHOS  --  1.8*  --  3.2  --    Liver Function Tests:  Recent Labs Lab 05/08/15 1512 05/10/15 0624 05/11/15 2030 05/13/15 0420  AST 54* 53* 60* 50*  ALT ALKPHOS 190* 151* 175* 130*  BILITOT 0.8 1.1 0.7 0.9  PROT 6.3* 5.9* 6.5 5.3*  ALBUMIN 2.2* 1.8* 2.0* 1.8*   No results for input(s): LIPASE, AMYLASE in the last 168 hours.  Recent Labs Lab 05/08/15 1512  AMMONIA 45*   CBC:  Recent Labs Lab 05/08/15 1512  05/09/15 0517 05/10/15 0624 05/11/15 2030 05/12/15 0540 05/13/15 0420  WBC 6.7  < > 4.1 5.9 6.8 6.9 10.4  NEUTROABS 5.2  --   --   --   --   --   --   HGB 13.1  < > 13.0 12.9* 13.9 12.8* 12.3*  HCT 41.0  < > 40.6 41.2 43.3 40.4 40.3  MCV 92.3  < > 91.2 92.4 92.1 91.8 93.3  PLT 189  < > 176 186 171 158 177  < > = values in this interval not displayed. Cardiac Enzymes: No results for input(s): CKTOTAL, CKMB, CKMBINDEX, TROPONINI in the last 168 hours. BNP (last 3 results)  Recent Labs  03/26/15 1738  BNP 125.1*    ProBNP (last 3 results) No results for input(s): PROBNP in the last 8760 hours.  CBG: No results for input(s): GLUCAP in the last 168 hours.  Recent Results (from the past 240 hour(s))  Blood culture (routine x 2)     Status: None   Collection Time: 05/08/15  4:40 PM  Result Value Ref Range Status   Specimen Description BLOOD RIGHT  HAND  Final   Special Requests BOTTLES DRAWN AEROBIC AND ANAEROBIC 5CC  Final   Culture NO GROWTH 5 DAYS  Final   Report Status 05/13/2015 FINAL  Final  Blood culture (routine x 2)     Status: None   Collection Time: 05/08/15  5:29 PM  Result Value Ref Range Status   Specimen Description BLOOD RIGHT FOREARM  Final   Special Requests BOTTLES DRAWN AEROBIC AND ANAEROBIC 5CC  Final   Culture NO GROWTH 5 DAYS  Final   Report Status 05/13/2015 FINAL  Final  MRSA PCR Screening     Status: Abnormal   Collection Time: 05/09/15  7:38 AM  Result Value Ref Range Status   MRSA by PCR POSITIVE (A) NEGATIVE Final    Comment:        The GeneXpert MRSA Assay (FDA approved for NASAL specimens only), is one component of a comprehensive MRSA colonization surveillance program. It is not intended to diagnose MRSA infection nor to guide or monitor treatment for MRSA infections. RESULT CALLED TO, READ BACK  BY AND VERIFIED WITH: A. MILLS RN 05/09/15 AT 0929 BY A. DAVIS   Culture, body fluid-bottle     Status: None (Preliminary result)   Collection Time: 05/09/15 11:47 AM  Result Value Ref Range Status   Specimen Description FLUID PLEURAL LEFT  Final   Special Requests NONE  Final   Culture NO GROWTH 4 DAYS  Final   Report Status PENDING  Incomplete  Gram stain     Status: None   Collection Time: 05/09/15 11:47 AM  Result Value Ref Range Status   Specimen Description FLUID PLEURAL LEFT  Final   Special Requests NONE  Final   Gram Stain   Final    MODERATE WBC PRESENT,BOTH PMN AND MONONUCLEAR NO ORGANISMS SEEN    Report Status 05/09/2015 FINAL  Final  Culture, body fluid-bottle     Status: None (Preliminary result)   Collection Time: 05/12/15  5:14 PM  Result Value Ref Range Status   Specimen Description FLUID LEFT PLEURAL  Final   Special Requests BOTTLES DRAWN AEROBIC AND ANAEROBIC 10CC  Final   Culture NO GROWTH < 24 HOURS  Final   Report Status PENDING  Incomplete  Gram stain     Status:  None   Collection Time: 05/12/15  5:14 PM  Result Value Ref Range Status   Specimen Description FLUID LEFT PLEURAL  Final   Special Requests NONE  Final   Gram Stain   Final    MODERATE WBC PRESENT, PREDOMINANTLY MONONUCLEAR NO ORGANISMS SEEN    Report Status 05/12/2015 FINAL  Final     Studies:  Recent x-ray studies have been reviewed in detail by the Attending Physician  Scheduled Meds:  Scheduled Meds: . antiseptic oral rinse  7 mL Mouth Rinse BID  . bisacodyl  10 mg Oral Daily  . Chlorhexidine Gluconate Cloth  6 each Topical Q0600  . enoxaparin (LOVENOX) injection  40 mg Subcutaneous Daily  . furosemide  40 mg Oral BID  . methadone  50 mg Oral Daily  . mometasone-formoterol  2 puff Inhalation BID  . mupirocin ointment  1 application Nasal BID  . senna-docusate  1 tablet Oral QHS  . spironolactone  25 mg Oral BID  . tiotropium  18 mcg Inhalation Daily    Time spent on care of this patient: 40 mins   Tasheem Elms, Roselind MessierURTIS J , MD  [facility] Office  [phone] Pager [phone]- [phone]  On-Call/Text Page:      [email or web address]      password [email or web address]  If 7PM-7AM, please contact night-coverage [email or web address] Password [email or web address] 05/13/2015, 9:07 PM   LOS: 5 days   Care during the described time interval was provided by me .  I have reviewed this patient's available data, including medical history, events of note, physical examination, and all test results as part of my evaluation. I have personally reviewed and interpreted all radiology studies.   Carolyne Littlesurtis Teanna Elem, MD [phone] Pager

## 2015-05-13 NOTE — Anesthesia Postprocedure Evaluation (Signed)
Anesthesia Post Note  Patient: Benjamin Bell  Procedure(s) Performed: Procedure(s) (LRB): VIDEO ASSISTED THORACOSCOPY (Left) DRAINAGE OF PLEURAL EFFUSION, MECHANICAL PLEURALDESIS, PLEURAL BIOPSIES (Left) VIDEO BRONCHOSCOPY (N/A) INSERTION PLEURAX CATH (N/A)  Patient location during evaluation: PACU Anesthesia Type: General Level of consciousness: awake and alert Pain management: pain level controlled Vital Signs Assessment: post-procedure vital signs reviewed and stable Respiratory status: spontaneous breathing, nonlabored ventilation, respiratory function stable and patient connected to nasal cannula oxygen Cardiovascular status: blood pressure returned to baseline and stable Postop Assessment: no signs of nausea or vomiting Anesthetic complications: no    Last Vitals:  Filed Vitals:   05/13/15 0840 05/13/15 1211  BP: 123/74   Pulse: 108   Temp: 37.3 C 36.6 C  Resp: 11     Last Pain:  Filed Vitals:   05/13/15 1211  PainSc: Asleep                 Shelton SilvasKevin D Hollis

## 2015-05-13 NOTE — Care Management Note (Addendum)
Case Management Note  Patient Details  Name: Benjamin Bell MRN: [id number] Date of Birth: 12/31/1955  Subjective/Objective:   COPD exacerbation,  Recurrent Pleural Effusion              Action/Plan: Discharge Planning: NCM spoke to pt and offered choice for HH/HH list provided. [facility] Pt reports having home oxygen. Explained to pt that his wife will need to bring his portable at time of dc. States he can afford his medications at home. Has RW and 3n1 bedside commode at home. [facility], waiting call back to confirm they can accept referral. Completed [facility] application for [facility] on chart. [facility].  Pt PCP, Dr Donia Guilesavid Talbot last day is May 29, 2015. Pt will need new PCP. Contacted [facility] liaison, Erskine SquibbJane # [phone] to see [facility]. And to schedule follow up appt.     Expected Discharge Date:  05/15/2015               Expected Discharge Plan:  Home w Home Health Services  In-House Referral:  NA  Discharge planning Services  CM Consult  Post Acute Care Choice:  Home Health Choice offered to:  Patient  DME Arranged:  N/A DME Agency:  NA  HH Arranged:  RN HH Agency:     Status of Service:  In process, will continue to follow  Medicare Important Message Given:    Date Medicare IM Given:    Medicare IM give by:    Date Additional Medicare IM Given:    Additional Medicare Important Message give by:     If discussed at Long Length of Stay Meetings, dates discussed:    Additional Comments:  Benjamin CousinShavis, Benjamin Justin Ellen, RN 05/13/2015, 4:01 PM

## 2015-05-13 NOTE — Progress Notes (Signed)
[facility] Progress Note  Subjective: No more rectal bleeding last 2 days per patient  Objective: Vital signs in last 24 hours: Temp:  [97.6 F (36.4 C)-99.3 F (37.4 C)] 99.2 F (37.3 C) (03/16 0840) Pulse Rate:  [86-117] 108 (03/16 0840) Resp:  [10-22] 11 (03/16 0840) BP: (105-159)/(71-92) 123/74 mmHg (03/16 0840) SpO2:  [89 %-100 %] 92 % (03/16 0840) Arterial Line BP: (133-175)/(60-76) 147/64 mmHg (03/16 0840) Weight:  [71.3 kg (157 lb 3 oz)] 71.3 kg (157 lb 3 oz) (03/16 0600) Weight change: -1.4 kg (-3 lb 1.4 oz)   PE: Unchanged  Lab Results: Results for orders placed or performed during the hospital encounter of 05/08/15 (from the past 24 hour(s))  Gram stain     Status: None   Collection Time: 05/12/15  5:14 PM  Result Value Ref Range   Specimen Description FLUID LEFT PLEURAL    Special Requests NONE    Gram Stain      MODERATE WBC PRESENT, PREDOMINANTLY MONONUCLEAR NO ORGANISMS SEEN    Report Status 05/12/2015 FINAL   Protein, pleural or peritoneal fluid     Status: None   Collection Time: 05/12/15  5:51 PM  Result Value Ref Range   Total protein, fluid <3.0 g/dL   Fluid Type-FTP PLEURAL   Body fluid cell count with differential     Status: Abnormal   Collection Time: 05/12/15  5:51 PM  Result Value Ref Range   Fluid Type-FCT PLEURAL    Color, Fluid PINK (A) YELLOW   Appearance, Fluid HAZY (A) CLEAR   WBC, Fluid 185 0 - 1000 cu mm   Neutrophil Count, Fluid 11 0 - 25 %   Lymphs, Fluid 28 %   Monocyte-Macrophage-Serous Fluid 61 50 - 90 %   Eos, Fluid 0 %  Glucose, pleural or peritoneal fluid     Status: None   Collection Time: 05/12/15  5:55 PM  Result Value Ref Range   Glucose, Fluid 79 mg/dL   Fluid Type-FGLU PLEURAL   Lactate dehydrogenase (CSF, pleural or peritoneal fluid)     Status: Abnormal   Collection Time: 05/12/15  5:56 PM  Result Value Ref Range   LD, Fluid 97 (H) 3 - 23 U/L   Fluid Type-FLDH PLEURAL   Comprehensive metabolic  panel     Status: Abnormal   Collection Time: 05/13/15  4:20 AM  Result Value Ref Range   Sodium 140 135 - 145 mmol/L   Potassium 4.7 3.5 - 5.1 mmol/L   Chloride 100 (L) 101 - 111 mmol/L   CO2 33 (H) 22 - 32 mmol/L   Glucose, Bld 60 (L) 65 - 99 mg/dL   BUN 22 (H) 6 - 20 mg/dL   Creatinine, Ser 1.19 0.61 - 1.24 mg/dL   Calcium 8.1 (L) 8.9 - 10.3 mg/dL   Total Protein 5.3 (L) 6.5 - 8.1 g/dL   Albumin 1.8 (L) 3.5 - 5.0 g/dL   AST 50 (H) 15 - 41 U/L   ALT 22 17 - 63 U/L   Alkaline Phosphatase 130 (H) 38 - 126 U/L   Total Bilirubin 0.9 0.3 - 1.2 mg/dL   GFR calc non Af Amer >60 >60 mL/min   GFR calc Af Amer >60 >60 mL/min   Anion gap 7 5 - 15  CBC     Status: Abnormal   Collection Time: 05/13/15  4:20 AM  Result Value Ref Range   WBC 10.4 4.0 - 10.5 K/uL   RBC 4.32 4.22 -  5.81 MIL/uL   Hemoglobin 12.3 (L) 13.0 - 17.0 g/dL   HCT 96.0 45.4 - 09.8 %   MCV 93.3 78.0 - 100.0 fL   MCH 28.5 26.0 - 34.0 pg   MCHC 30.5 30.0 - 36.0 g/dL   RDW 11.9 (H) 14.7 - 82.9 %   Platelets 177 150 - 400 K/uL  Protime-INR     Status: Abnormal   Collection Time: 05/13/15  4:20 AM  Result Value Ref Range   Prothrombin Time 18.6 (H) 11.6 - 15.2 seconds   INR 1.55 (H) 0.00 - 1.49    Studies/Results: US Abdomen Complete  05/11/2015  CLINICAL DATA:  Cirrhosis with ascites, abdominal distention EXAM: ABDOMEN ULTRASOUND COMPLETE COMPARISON:  03/18/2015, 03/07/2015 FINDINGS: Gallbladder: Moderate gallbladder distention with a small echogenic non shadowing mobile focus measuring 10 mm in the fundus, suspect gallbladder sludge. No definite shadowing gallstones. Wall thickness is mildly thickened measuring 4.1 mm. Trace pericholecystic fluid. No Murphy sign elicited. Appearance of the gallbladder is likely secondary to chronic hepatic disease or cirrhosis. Common bile duct: Diameter: Diffusely dilated up to 13 mm but similar to the prior CT 03/07/2015 no intrahepatic biliary dilatation. Liver: Mildly nodular liver  surface compatible with hepatic cirrhosis. Enlargement of the left hepatic lobe noted. Heterogeneous hypoechoic hepatic lesion in the right hepatic dome measuring 19 x 15 x 22 mm, indeterminate by ultrasound. In the setting of cirrhosis, recommend further evaluation with nonemergent abdominal MRI. IVC: No abnormality visualized. Pancreas: Visualized portion unremarkable. Pancreatic duct is demonstrated measuring 2.6 mm. No change compared to the prior CT. Spleen: Size and appearance within normal limits. Right Kidney: Length: 11.8 cm. Small hypoechoic renal cysts throughout the right kidney 1 measuring 6 mm and a second measuring 13 mm. No renal obstruction or hydronephrosis. Left Kidney: Length: 12.6 mm. Echogenic shadowing stone measuring 18 mm in the upper pole correlating with the CT. No hydronephrosis. Mild cortical thinning. Abdominal aorta: Aortic atherosclerosis noted. Negative for aneurysm. Distal aorta is obscured by bowel gas. Other findings: Small amount of perihepatic ascites. IMPRESSION: Evidence of hepatic cirrhosis and trace amount of perihepatic ascites. Right hepatic dome 2.2 cm hypoechoic solid lesion by ultrasound remains indeterminate. See above comment and recommendation for further evaluation non emergently. Chronic common bile duct dilatation. Moderately distended gallbladder with mild wall thickening and pericholecystic fluid and intraluminal mobile echogenic sludge. No Murphy sign or definite cholecystitis. Gallbladder appearance is likely secondary to chronic hepatic disease/hypoproteinemia. Incidental right renal cysts Left upper pole nephrolithiasis Aortic atherosclerosis Electronically Signed   By: Judie Petit.  Shick M.D.   On: 05/11/2015 18:36   Dg Chest Port 1 View  05/13/2015  CLINICAL DATA:  Pleural effusion and left chest tube EXAM: PORTABLE CHEST 1 VIEW COMPARISON:  05/12/2015 FINDINGS: Left chest tube remains unchanged in position. No pneumothorax. Minimal residual left pleural  effusion unchanged. Left lower lobe airspace disease unchanged. Mild right lower lobe airspace disease improved. Right jugular catheter tip in the SVC. Improvement in vascular congestion IMPRESSION: Left chest tube remains in good position with minimal left effusion. No pneumothorax. Left lower lobe airspace disease unchanged Improvement in vascular congestion. Improvement in right lower lobe airspace disease. Electronically Signed   By: Marlan Palau M.D.   On: 05/13/2015 07:49   Dg Chest Port 1 View  05/12/2015  CLINICAL DATA:  Pleural effusion, status post chest tube placement. EXAM: PORTABLE CHEST 1 VIEW COMPARISON:  Chest x-rays dated 05/11/2015 and 05/09/2015. FINDINGS: Significantly decreased left pleural effusion status post left-sided chest tube  placement, with improved aeration. Opacity persists at the left lung base, most likely a combination of atelectasis and small pleural effusion. Ill-defined opacities within the mid to lower right lung are most suggestive of mild edema. Cardiomediastinal silhouette appears stable in size and configuration. Right IJ central line has been placed with tip well positioned at the level of the lower SVC. No pneumothorax seen. IMPRESSION: 1. Significant decrease in the size of the left pleural effusion status post chest tube placement. Probable small residual pleural effusion and atelectasis at the left lung base. 2. Ill-defined opacities within the mid to lower right lung most suggestive of mild edema. 3. Right IJ central line placed in the interval with tip well-positioned at the level of the lower SVC. No pneumothorax or other procedural complicating feature. Electronically Signed   By: Bary RichardStan  Maynard M.D.   On: 05/12/2015 18:54      Assessment: Rectal bleeding, very likely hemorrhoidal, resolved  Plan: Expectant management alone. We'll sign off for now. Please call back if further GI input needed.    Titilayo Hagans C 05/13/2015, 10:39 AM  Pager  [phone] If no answer or after 5 PM call [phone]

## 2015-05-14 ENCOUNTER — Inpatient Hospital Stay (HOSPITAL_COMMUNITY): Payer: Medicaid Other

## 2015-05-14 DIAGNOSIS — J948 Other specified pleural conditions: Secondary | ICD-10-CM

## 2015-05-14 DIAGNOSIS — K7460 Unspecified cirrhosis of liver: Secondary | ICD-10-CM

## 2015-05-14 DIAGNOSIS — G894 Chronic pain syndrome: Secondary | ICD-10-CM

## 2015-05-14 LAB — CBC
HCT: 35.3 % — ABNORMAL LOW (ref 39.0–52.0)
Hemoglobin: 11 g/dL — ABNORMAL LOW (ref 13.0–17.0)
MCH: 28.6 pg (ref 26.0–34.0)
MCHC: 31.2 g/dL (ref 30.0–36.0)
MCV: 91.7 fL (ref 78.0–100.0)
Platelets: 147 10*3/uL — ABNORMAL LOW (ref 150–400)
RBC: 3.85 MIL/uL — ABNORMAL LOW (ref 4.22–5.81)
RDW: 15.9 % — ABNORMAL HIGH (ref 11.5–15.5)
WBC: 7.9 10*3/uL (ref 4.0–10.5)

## 2015-05-14 LAB — CULTURE, BODY FLUID W GRAM STAIN -BOTTLE: Culture: NO GROWTH

## 2015-05-14 LAB — BASIC METABOLIC PANEL
Anion gap: 4 — ABNORMAL LOW (ref 5–15)
BUN: 24 mg/dL — ABNORMAL HIGH (ref 6–20)
CO2: 34 mmol/L — ABNORMAL HIGH (ref 22–32)
Calcium: 7.6 mg/dL — ABNORMAL LOW (ref 8.9–10.3)
Chloride: 99 mmol/L — ABNORMAL LOW (ref 101–111)
Creatinine, Ser: 1.02 mg/dL (ref 0.61–1.24)
GFR calc Af Amer: >60 mL/min (ref >60)
GFR calc non Af Amer: >60 mL/min (ref >60)
Glucose, Bld: 108 mg/dL — ABNORMAL HIGH (ref 65–99)
Potassium: 4.2 mmol/L (ref 3.5–5.1)
Sodium: 137 mmol/L (ref 135–145)

## 2015-05-14 LAB — ACID FAST SMEAR (AFB): ACID FAST SMEAR - AFSCU2: NEGATIVE

## 2015-05-14 LAB — CULTURE, BODY FLUID-BOTTLE

## 2015-05-14 NOTE — Progress Notes (Signed)
PULMONARY / CRITICAL CARE MEDICINE   Name: Benjamin Bell MRN: [id number] DOB: 10-13-1955    ADMISSION DATE:  05/08/2015 CONSULTATION DATE:  05/08/15  REFERRING MD:  ED physician  CHIEF COMPLAINT:  Shortness of breath  Discussion: Benjamin Bell 60 year old gentleman with PMH significant for left  Traumatic hemopneumothorax,COPD, cirrhosis now presents with recurrent pleural effusion. He is followed by Dr. Kendrick FriesMcQuaid for this. . SUBJECTIVE:  Patient says sore from the chest tube,breathing overall has improved. Marland Kitchen. VITAL SIGNS: BP 100/77 mmHg  Pulse 101  Temp(Src) 97.1 F (36.2 C) (Oral)  Resp 14  Ht 5\' 11"  (1.803 m)  Wt 132 lb 6.4 oz (60.056 kg)  BMI 18.47 kg/m2  SpO2 94%  HEMODYNAMICS:    VENTILATOR SETTINGS:    INTAKE / OUTPUT: I/O last 3 completed shifts: In: 505 [P.O.:120; I.V.:335; IV Piggyback:50] Out: 1670 [Urine:1075; Chest Tube:595]  PHYSICAL EXAMINATION: General: weak,ill appearing male , in no acute distress HEENT:  Atraumatic, normocephalic,no discharge noted, sclera white Cardiovascular:  S1S2 heart sounds, rrr, No MRG noted Lungs: Diminished breath sounds left lower lobe, clear otherwise. Chest tube out Abdomen:  Firm, non tender, good bowel sounds Musculoskeletal:  No joint abnormalities, Skin:  No rashes, bruises, ecchymosis , lesions noted  LABS:  BMET  Recent Labs Lab 05/12/15 0540 05/13/15 0420 05/14/15 0515  NA 142 140 137  K 4.1 4.7 4.2  CL 101 100* 99*  CO2 31 33* 34*  BUN 22* 22* 24*  CREATININE 0.99 1.04 1.02  GLUCOSE 75 60* 108*    Electrolytes  Recent Labs Lab 05/11/15 0335  05/12/15 0540 05/13/15 0420 05/14/15 0515  CALCIUM 8.0*  < > 8.0* 8.1* 7.6*  MG 1.7  --  2.1  --   --   PHOS 1.8*  --  3.2  --   --   < > = values in this interval not displayed.  CBC  Recent Labs Lab 05/12/15 0540 05/13/15 0420 05/14/15 0515  WBC 6.9 10.4 7.9  HGB 12.8* 12.3* 11.0*  HCT 40.4 40.3 35.3*  PLT 158 177 147*    Coag's  Recent  Labs Lab 05/10/15 0624 05/11/15 2030 05/13/15 0420  APTT  --  29  --   INR 1.42 1.31 1.55*    Sepsis Markers  Recent Labs Lab 05/08/15 1534 05/08/15 1740  LATICACIDVEN 2.15* 1.08    ABG  Recent Labs Lab 05/08/15 1805 05/08/15 2118 05/11/15 2042  PHART 7.354 7.380 7.475*  PCO2ART 55.2* 52.6* 44.2  PO2ART 61.0* 69.0* 63.3*    Liver Enzymes  Recent Labs Lab 05/10/15 0624 05/11/15 2030 05/13/15 0420  AST 53* 60* 50*  ALT 20 26 22   ALKPHOS 151* 175* 130*  BILITOT 1.1 0.7 0.9  ALBUMIN 1.8* 2.0* 1.8*    Cardiac Enzymes No results for input(s): TROPONINI, PROBNP in the last 168 hours.  Glucose No results for input(s): GLUCAP in the last 168 hours.  Imaging Dg Chest 2 View  05/14/2015  CLINICAL DATA:  Post VATS 2 days ago.  Chest soreness EXAM: CHEST  2 VIEW COMPARISON:  05/13/2015 FINDINGS: Interval removal of left chest tube. No pneumothorax. There is hyperinflation of the lungs compatible with COPD. Left basilar opacity likely reflects atelectasis. Small left pleural effusion. No confluent opacity on the right. Heart is normal size. Right central line is unchanged. IMPRESSION: Interval removal of left chest tube without visible pneumothorax. Left base atelectasis with small left effusion. Electronically Signed   By: Charlett NoseKevin  Dover M.D.   On: 05/14/2015  09:00    ASSESSMENT / PLAN:  PULMONARY A: COPD exacerbation, Pleural effusion. Hx traumatic L hydropneumothorax Jan, 2017 with continued drainage until chest tube site healed over. Question how much may be ascites fluid. P:   Continue  Albuterol/Spiriva/dulera Chest tube placed (VATS)- 3/15 by CT surgeon out Titrate O2 for sat of 88-92%. Follow effusion with diuresis. Small Effusion unchanged(3/16) no air leak Follow CXR per cvts  CARDIOVASCULAR A:  HTN, edema P:   continue Lasix 40 mg PO BID.  continue Aldactone 25 mg PO BID  RENAL A:   AKI probably due to Lasix Hypoprotenemia P:   Trend BUN, Cr  in am Replete electrolytes as indicated.  INFECTIOUS A:   Pleural effusion.  P:   Cultures negative this far,  WBC normal,  Methadone maintenance, had recently called our office seeking oxycodone for neck pain Reports daily maintenance through [facility] P:  Continue 50 mg of methadone( stated maintenance dose)  Hepatic cirrhosis Previous ETOH abuse Transaminitis P-  Follow ALT, AST intermittently   PCCM will be available PRN, please call if needed.   Brett Canales Minor ACNP [facility] PCCM Pager [phone] till 3 pm If no answer page [phone] 05/14/2015, 10:41 AM

## 2015-05-14 NOTE — Op Note (Signed)
NAMJanalyn Shy:  [place]latch, Georgi                 ACCOUNT NO.:  [id number]  MEDICAL RECORD NO.:  [id number]  LOCATION:  3S01C                        FACILITY:  [facility]  PHYSICIAN:  Sheliah PlaneEdward Mesha Schamberger, MD    DATE OF BIRTH:  03-04-1955  DATE OF PROCEDURE:  05/12/2015 DATE OF DISCHARGE:                              OPERATIVE REPORT   PREOPERATIVE DIAGNOSIS:  Recurrent left pleural effusion.  POSTOPERATIVE DIAGNOSIS:  Recurrent left pleural effusion.  SURGICAL PROCEDURE:  Bronchoscopy, left video-assisted thoracoscopy, pleural biopsy, drainage of pleural effusion, mechanical pleurodesis, and placement of PleurX catheter.  SURGEON:  Sheliah PlaneEdward Sennie Borden, MD  FIRST ASSISTANT:  Rowe ClackWayne E. Gold, PA-C  BRIEF HISTORY:  The patient is a 60 year old male with known cirrhosis who had been cared for by the Pulmonary Service since January after falling and having rib fractures and developing a left pleural effusion. Chest tube was placed, drained pleural effusion.  Two to three weeks prior to surgery, the patient had been referred to the Thoracic Surgery Office, seen by Dr. Dorris FetchHendrickson at that time and followup chest x-ray and recent CT scan showed resolution of the effusion.  There was no need for any surgical intervention.  The patient then returned several days prior to surgery with increasing shortness of breath and a large left pleural effusion, 1600 mL of fluid were withdrawn to stabilize his condition.  Cardiothoracic Surgery was consulted.  Ultrasound of the abdomen to evaluate his cirrhosis and the degree of ascites was obtained.  We then recommended proceeding with left video-assisted thoracoscopy, drainage of the effusion, pleural biopsies as appropriate, and placement of PleurX catheter.  The patient agreed and signed informed consent.  DESCRIPTION OF PROCEDURE:  With appropriate IVs and monitoring lines in place, the patient underwent general endotracheal anesthesia without incident.  A single-lumen  endotracheal tube was placed.  Through this endotracheal tube, fiberoptic video bronchoscope was passed to the subsegmental level.  There were no endobronchial lesions appreciated. On the left lower lobe bronchus, you could see extrinsic compression, but no intraluminal mass.  The scope was removed.  The patient was turned in lateral decubitus position with the left side up.  A second time-out was performed.  A small incision was made in the midaxillary line, approximately the 5th intercostal space.  Through this, a trocar was introduced and a small suction catheter was used to remove pleural fluid.  The scope was then placed through the port.  There were some adhesions posteriorly and minor loculations.  The remainder of the fluid was completely removed.  The pleural surface appeared normal.  See photographs and epic.  Median tap random pleural biopsies were obtained through a second port site.  A mechanical pleurodesis was performed.  We then decided to place a PleurX catheter.  This was tunneled through the subcutaneous tissue.  A guidewire was placed through the Angiocath that had been introduced into the chest visually with the scope.  Over the guide wire, dilators were placed.  Peel-away sheaths were placed and the PleurX catheter was then introduced into the left pleural space and secured.  A single 28 chest tube was left through the most anterior port site.  It reinflated the lung,  which inflated nicely completely.  The second port site was closed tightly.  The patient was then awakened and extubated in the operating room having tolerated the procedure without obvious complications.  The sponge and needle count was reported as correct at the completion of procedure.  Approximately 2 L of pleural fluid were removed.  Fluid was sent for cytology, chemistries, and cultures.     Sheliah Plane, MD     EG/MEDQ  D:  05/14/2015  T:  05/14/2015  Job:  [id number]

## 2015-05-14 NOTE — Progress Notes (Addendum)
[address]       [place] [id number]             [phone]      2 Days Post-Op Procedure(s) (LRB): VIDEO ASSISTED THORACOSCOPY (Left) DRAINAGE OF PLEURAL EFFUSION, MECHANICAL PLEURALDESIS, PLEURAL BIOPSIES (Left) VIDEO BRONCHOSCOPY (N/A) INSERTION PLEURAX CATH (N/A) Subjective: Feels better, not significantly SOB 175 from pleurx  This am   Objective: Vital signs in last 24 hours: Temp:  [97.8 F (36.6 C)-99.2 F (37.3 C)] 98.9 F (37.2 C) (03/17 0356) Pulse Rate:  [93-108] 93 (03/17 0356) Cardiac Rhythm:  [-] Normal sinus rhythm (03/17 0356) Resp:  [7-20] 11 (03/17 0356) BP: (97-123)/(57-74) 116/63 mmHg (03/17 0356) SpO2:  [92 %-99 %] 92 % (03/17 0356) Arterial Line BP: (147)/(64) 147/64 mmHg (03/16 0840) Weight:  [132 lb 6.4 oz (60.056 kg)] 132 lb 6.4 oz (60.056 kg) (03/17 0700)  Hemodynamic parameters for last 24 hours:    Intake/Output from previous day: 03/16 0701 - 03/17 0700 In: 230 [I.V.:230] Out: 945 [Urine:700; Chest Tube:245] Intake/Output this shift: Total I/O In: -  Out: 200 [Urine:200]  General appearance: alert, cooperative and no distress Heart: regular rate and rhythm Lungs: fair air exchange throughout Abdomen: soft Extremities: no edema Wound: incis ok  Lab Results:  Recent Labs  05/13/15 0420 05/14/15 0515  WBC 10.4 7.9  HGB 12.3* 11.0*  HCT 40.3 35.3*  PLT 177 147*   BMET:  Recent Labs  05/13/15 0420 05/14/15 0515  NA 140 137  K 4.7 4.2  CL 100* 99*  CO2 33* 34*  GLUCOSE 60* 108*  BUN 22* 24*  CREATININE 1.04 1.02  CALCIUM 8.1* 7.6*    PT/INR:  Recent Labs  05/13/15 0420  LABPROT 18.6*  INR 1.55*   ABG    Component Value Date/Time   PHART 7.475* 05/11/2015 2042   HCO3 32.4* 05/11/2015 2042   TCO2 33.8 05/11/2015 2042   ACIDBASEDEF 3.1* 03/27/2015 0115   O2SAT 93.3 05/11/2015 2042   CBG (last 3)  No results for input(s): GLUCAP in the last 72 hours.  Meds Scheduled Meds: .  antiseptic oral rinse  7 mL Mouth Rinse BID  . bisacodyl  10 mg Oral Daily  . Chlorhexidine Gluconate Cloth  6 each Topical Q0600  . enoxaparin (LOVENOX) injection  40 mg Subcutaneous Daily  . furosemide  40 mg Oral BID  . methadone  50 mg Oral Daily  . mometasone-formoterol  2 puff Inhalation BID  . mupirocin ointment  1 application Nasal BID  . senna-docusate  1 tablet Oral QHS  . spironolactone  25 mg Oral BID  . tiotropium  18 mcg Inhalation Daily   Continuous Infusions: . lactated ringers 10 mL/hr at 05/13/15 1900   PRN Meds:.albuterol, ketorolac, ondansetron (ZOFRAN) IV, potassium chloride  Xrays Dg Chest Port 1 View  05/13/2015  CLINICAL DATA:  Pleural effusion and left chest tube EXAM: PORTABLE CHEST 1 VIEW COMPARISON:  05/12/2015 FINDINGS: Left chest tube remains unchanged in position. No pneumothorax. Minimal residual left pleural effusion unchanged. Left lower lobe airspace disease unchanged. Mild right lower lobe airspace disease improved. Right jugular catheter tip in the SVC. Improvement in vascular congestion IMPRESSION: Left chest tube remains in good position with minimal left effusion. No pneumothorax. Left lower lobe airspace disease unchanged Improvement in vascular congestion. Improvement in right lower lobe airspace disease. Electronically Signed   By: Marlan Palauharles  Clark M.D.   On: 05/13/2015 07:49   Dg Chest  Port 1 View  05/12/2015  CLINICAL DATA:  Pleural effusion, status post chest tube placement. EXAM: PORTABLE CHEST 1 VIEW COMPARISON:  Chest x-rays dated 05/11/2015 and 05/09/2015. FINDINGS: Significantly decreased left pleural effusion status post left-sided chest tube placement, with improved aeration. Opacity persists at the left lung base, most likely a combination of atelectasis and small pleural effusion. Ill-defined opacities within the mid to lower right lung are most suggestive of mild edema. Cardiomediastinal silhouette appears stable in size and configuration.  Right IJ central line has been placed with tip well positioned at the level of the lower SVC. No pneumothorax seen. IMPRESSION: 1. Significant decrease in the size of the left pleural effusion status post chest tube placement. Probable small residual pleural effusion and atelectasis at the left lung base. 2. Ill-defined opacities within the mid to lower right lung most suggestive of mild edema. 3. Right IJ central line placed in the interval with tip well-positioned at the level of the lower SVC. No pneumothorax or other procedural complicating feature. Electronically Signed   By: Bary Richard M.D.   On: 05/12/2015 18:54    Results for orders placed or performed during the hospital encounter of 05/08/15  Blood culture (routine x 2)     Status: None   Collection Time: 05/08/15  4:40 PM  Result Value Ref Range Status   Specimen Description BLOOD RIGHT HAND  Final   Special Requests BOTTLES DRAWN AEROBIC AND ANAEROBIC 5CC  Final   Culture NO GROWTH 5 DAYS  Final   Report Status 05/13/2015 FINAL  Final  Blood culture (routine x 2)     Status: None   Collection Time: 05/08/15  5:29 PM  Result Value Ref Range Status   Specimen Description BLOOD RIGHT FOREARM  Final   Special Requests BOTTLES DRAWN AEROBIC AND ANAEROBIC 5CC  Final   Culture NO GROWTH 5 DAYS  Final   Report Status 05/13/2015 FINAL  Final  MRSA PCR Screening     Status: Abnormal   Collection Time: 05/09/15  7:38 AM  Result Value Ref Range Status   MRSA by PCR POSITIVE (A) NEGATIVE Final    Comment:        The GeneXpert MRSA Assay (FDA approved for NASAL specimens only), is one component of a comprehensive MRSA colonization surveillance program. It is not intended to diagnose MRSA infection nor to guide or monitor treatment for MRSA infections. RESULT CALLED TO, READ BACK BY AND VERIFIED WITH: A. MILLS RN 05/09/15 AT 0929 BY A. DAVIS   Culture, body fluid-bottle     Status: None (Preliminary result)   Collection Time:  05/09/15 11:47 AM  Result Value Ref Range Status   Specimen Description FLUID PLEURAL LEFT  Final   Special Requests NONE  Final   Culture NO GROWTH 4 DAYS  Final   Report Status PENDING  Incomplete  Gram stain     Status: None   Collection Time: 05/09/15 11:47 AM  Result Value Ref Range Status   Specimen Description FLUID PLEURAL LEFT  Final   Special Requests NONE  Final   Gram Stain   Final    MODERATE WBC PRESENT,BOTH PMN AND MONONUCLEAR NO ORGANISMS SEEN    Report Status 05/09/2015 FINAL  Final  Culture, body fluid-bottle     Status: None (Preliminary result)   Collection Time: 05/12/15  5:14 PM  Result Value Ref Range Status   Specimen Description FLUID LEFT PLEURAL  Final   Special Requests BOTTLES DRAWN AEROBIC AND  ANAEROBIC 10CC  Final   Culture NO GROWTH < 24 HOURS  Final   Report Status PENDING  Incomplete  Gram stain     Status: None   Collection Time: 05/12/15  5:14 PM  Result Value Ref Range Status   Specimen Description FLUID LEFT PLEURAL  Final   Special Requests NONE  Final   Gram Stain   Final    MODERATE WBC PRESENT, PREDOMINANTLY MONONUCLEAR NO ORGANISMS SEEN    Report Status 05/12/2015 FINAL  Final  Acid Fast Smear (AFB)     Status: None   Collection Time: 05/12/15  5:14 PM  Result Value Ref Range Status   AFB Specimen Processing Concentration  Final   Acid Fast Smear Negative  Final    Comment: (NOTE) Performed At: [facility] [address] [place], [place] [id number] Mila Homer MD JX:[id number]    Source (AFB) FLUID  Final    Comment: LEFT PLEURAL     Assessment/Plan: S/P Procedure(s) (LRB): VIDEO ASSISTED THORACOSCOPY (Left) DRAINAGE OF PLEURAL EFFUSION, MECHANICAL PLEURALDESIS, PLEURAL BIOPSIES (Left) VIDEO BRONCHOSCOPY (N/A) INSERTION PLEURAX CATH (N/A) doing well  Cont to drain pleurx intermit Micro neg Cytology not malignant Cont care per primary  LOS: 6 days    GOLD,Benjamin Bell 05/14/2015  Plan daily drainage  pleurix for 7 days after d/c then qod  Home when ever primary service sees fit  Will need follow up Pulmonary , I have seen and examined Benjamin Bell and agree with the above assessment  and plan.  Delight Ovens MD Beeper [phone] Office [phone] 05/14/2015 2:50 PM

## 2015-05-14 NOTE — Hospital Discharge Follow-Up (Signed)
Transitional Care Clinic Care Coordination Note:  Admit date:  05/08/15 Discharge date: TBD Discharge Disposition: Home with home health services Patient contact:  [phone] Emergency contact(s): Malena Peer [phone])  This Case Manager reviewed patient's EMR and determined patient would benefit from post-discharge medical management and chronic care management services through the [facility]. Patient has a history of COPD, recurrent left pleural effusion, hepatic cirrhosis, HTN, hepatitis C. Patient has had three inpatient admissions in the last six months. This Case Manager met with patient to discuss the services and medical management that can be provided at the [facility]. Patient verbalized understanding and agreed to receive post-discharge care at the [facility].   Patient scheduled for Transitional Care appointment on 05/25/15 at 1515 with Dr. Jarold Song.  Clinic information and appointment time provided to patient. Appointment information also placed on AVS.  Assessment:       Home Environment: Patient lives with his spouse in a private residence.       Support System: Spouse-Wanda       Level of functioning: Primarily independent with personal ADLs; however, he does indicate his spouse provides assist as needed.       Home DME: rolling walker, 3 in 1 bedside commode, home oxygen (Patient uncertain of oxygen supplier).       Home care services: none prior to admission but home with home health services planned after discharge.       Transportation: Patient uses [facility] [employer] ([place] transport) to get to his medical appointments. Patient denies difficulty getting to his appointments and is aware that 72 hour notice is needed for transportation to be provided.        Food/Nutrition: Patient indicates he receives [facility] ($180/month). He indicates his spouse shops for and prepares all meals.         Medications: Patient  indicates he uses [facility] on [address] for his medications. Patient denies difficulty affording or obtaining his medications.        Identified Barriers: Per Lacinda Axon, RN CM, patient's current PCP's last day is 05/29/15. Patient will need to establish care with provider at [place] after 30 days of medical management with the [facility].                   Arranged services:        Services communicated to Whitman Hero, RN CM. Referral received from Lacinda Axon, RN CM.

## 2015-05-14 NOTE — Progress Notes (Signed)
Metal case with $6 found in patients bedside table, walked to his new room by unit secretary, Elmarie Shileyiffany, and handed personally to patient.

## 2015-05-14 NOTE — Progress Notes (Signed)
CM made referral with [facility]. Com[facility] @ [phone]. Gae Gallopngela Lyndol Vanderheiden RN,BSN,CM [phone]

## 2015-05-14 NOTE — Progress Notes (Signed)
Pt arrived to unit.  Telemetry placed and CCMD notified.  Pt oriented to room including call light and telephone.  Pt denies sob.  No s/s of distress.

## 2015-05-14 NOTE — Progress Notes (Signed)
[place] TEAM 1 - Stepdown/ICU TEAM PROGRESS NOTE  Corky MullJames Cannan ZOX:[id number] DOB: [birth year] DOA: 05/08/2015 PCP: No primary care provider on file.  Admit HPI / Brief Narrative: 60 y/o M Hx COPD and a recurrent L pleural effusion followed by Dr. Kendrick FriesMcQuaid who [facility] w/ increasing shortness of breath. He was noted to have sats of 68% on RA. He was given albuterol, atrovent and solumedrol by EMS. A chest x-ray in the ED showed an increasing right sided pleural effusion.   HPI/Subjective: The pt has no new complaints this morning.  He denies sob, n/v, or abdom pain.  He is having modest pain at the site of his prior L chest tube.    Assessment/Plan:  Recurrent Lt Pleural effusion S/P thoracentesis and now VATS - pleural fluid transudative - now has Pleurex w/ plans to drain intermittently - follow serial CXR   Fall w/ rib fx, PTX, Hemothorax Jan 2017 eventually required chest tube   EtOH abuse/Hepatic cirrhosis - MELD 11 cirrhotic liver noted on imaging - acute hepatitis panel negative except for HCV ab (known)   [id number] R hepatic dome lesion  Needs Abdom MRI to eval once stable post-op - discussed w/ pt - will order for today   Polysubstance abuse  On chronic methadone - do NOT use additional narcotic  Hepatitis C s/p tx  No evidence of hepatitis presently   Episodic BRBPR Appears to have been a self-limited mild issue - GI has signed off   COPD Well compensated at this time - follow on current med tx   HTN BP currently controlled - follow   Chronic pain syndrome (neck pain) Methadone 50 mg daily - reports daily maintenance through [facility] [place] - follow QTc - pain well controlled at this time   MRSA screen +   Code Status: FULL Family Communication: no family present at time of exam Disposition Plan: stable for transfer to tele bed - monitor serial CXR - establish routine for drainage of Pleurex cath   Consultants: [facility]    TCTS  Procedures: 3/12 left thoracentesis > 1.6 L cloudy yellow fluid 3/15 VATS - mechanical pleuradesis, pleural bx, insertion Pleurex cath   Antibiotics: Zosyn 3/11 > 3/14 Vanc 3/11 > 3/13 Cefuroxime 3/15 > 3/16  DVT prophylaxis: SCDs  Objective: Blood pressure 116/63, pulse 93, temperature 97.1 F (36.2 C), temperature source Oral, resp. rate 11, height 5\' 11"  (1.803 m), weight 60.056 kg (132 lb 6.4 oz), SpO2 94 %.  Intake/Output Summary (Last 24 hours) at 05/14/15 0928 Last data filed at 05/14/15 0701  Gross per 24 hour  Intake    230 ml  Output   1075 ml  Net   -845 ml   Exam: General: No acute respiratory distress  Lungs: Clear to auscultation bilaterally without wheezes or crackles - distant BS th/o  Cardiovascular: Regular rate and rhythm without murmur  Abdomen: Nontender, nondistended, soft, bowel sounds positive, no rebound, no ascites, no appreciable mass Extremities: No significant cyanosis, clubbing, edema bilateral lower extremities  Data Reviewed:  Basic Metabolic Panel:  Recent Labs Lab 05/11/15 0335 05/11/15 2030 05/12/15 0540 05/13/15 0420 05/14/15 0515  NA 143 141 142 140 137  K 3.9 4.5 4.1 4.7 4.2  CL 102 100* 101 100* 99*  CO2 29 33* 31 33* 34*  GLUCOSE 86 150* 75 60* 108*  BUN 26* 22* 22* 22* 24*  CREATININE 1.16 1.10 0.99 1.04 1.02  CALCIUM 8.0* 8.2*  8.0* 8.1* 7.6*  MG 1.7  --  2.1  --   --   PHOS 1.8*  --  3.2  --   --     CBC:  Recent Labs Lab 05/08/15 1512  05/10/15 0624 05/11/15 2030 05/12/15 0540 05/13/15 0420 05/14/15 0515  WBC 6.7  < > 5.9 6.8 6.9 10.4 7.9  NEUTROABS 5.2  --   --   --   --   --   --   HGB 13.1  < > 12.9* 13.9 12.8* 12.3* 11.0*  HCT 41.0  < > 41.2 43.3 40.4 40.3 35.3*  MCV 92.3  < > 92.4 92.1 91.8 93.3 91.7  PLT 189  < > 186 171 158 177 147*  < > = values in this interval not displayed.  Liver Function Tests:  Recent Labs Lab 05/08/15 1512 05/10/15 0624 05/11/15 2030 05/13/15 0420  AST  54* 53* 60* 50*  ALT ALKPHOS 190* 151* 175* 130*  BILITOT 0.8 1.1 0.7 0.9  PROT 6.3* 5.9* 6.5 5.3*  ALBUMIN 2.2* 1.8* 2.0* 1.8*    Recent Labs Lab 05/08/15 1512  AMMONIA 45*    Coags:  Recent Labs Lab 05/08/15 1512 05/10/15 0624 05/11/15 2030 05/13/15 0420  INR 1.44 1.42 1.31 1.55*    Recent Labs Lab 05/11/15 2030  APTT 29    Recent Results (from the past 240 hour(s))  Blood culture (routine x 2)     Status: None   Collection Time: 05/08/15  4:40 PM  Result Value Ref Range Status   Specimen Description BLOOD RIGHT HAND  Final   Special Requests BOTTLES DRAWN AEROBIC AND ANAEROBIC 5CC  Final   Culture NO GROWTH 5 DAYS  Final   Report Status 05/13/2015 FINAL  Final  Blood culture (routine x 2)     Status: None   Collection Time: 05/08/15  5:29 PM  Result Value Ref Range Status   Specimen Description BLOOD RIGHT FOREARM  Final   Special Requests BOTTLES DRAWN AEROBIC AND ANAEROBIC 5CC  Final   Culture NO GROWTH 5 DAYS  Final   Report Status 05/13/2015 FINAL  Final  MRSA PCR Screening     Status: Abnormal   Collection Time: 05/09/15  7:38 AM  Result Value Ref Range Status   MRSA by PCR POSITIVE (A) NEGATIVE Final    Comment:        The GeneXpert MRSA Assay (FDA approved for NASAL specimens only), is one component of a comprehensive MRSA colonization surveillance program. It is not intended to diagnose MRSA infection nor to guide or monitor treatment for MRSA infections. RESULT CALLED TO, READ BACK BY AND VERIFIED WITH: A. MILLS RN 05/09/15 AT 0929 BY A. DAVIS   Culture, body fluid-bottle     Status: None (Preliminary result)   Collection Time: 05/09/15 11:47 AM  Result Value Ref Range Status   Specimen Description FLUID PLEURAL LEFT  Final   Special Requests NONE  Final   Culture NO GROWTH 4 DAYS  Final   Report Status PENDING  Incomplete  Gram stain     Status: None   Collection Time: 05/09/15 11:47 AM  Result Value Ref Range Status    Specimen Description FLUID PLEURAL LEFT  Final   Special Requests NONE  Final   Gram Stain   Final    MODERATE WBC PRESENT,BOTH PMN AND MONONUCLEAR NO ORGANISMS SEEN    Report Status 05/09/2015 FINAL  Final  Culture, body fluid-bottle  Status: None (Preliminary result)   Collection Time: 05/12/15  5:14 PM  Result Value Ref Range Status   Specimen Description FLUID LEFT PLEURAL  Final   Special Requests BOTTLES DRAWN AEROBIC AND ANAEROBIC 10CC  Final   Culture NO GROWTH < 24 HOURS  Final   Report Status PENDING  Incomplete  Gram stain     Status: None   Collection Time: 05/12/15  5:14 PM  Result Value Ref Range Status   Specimen Description FLUID LEFT PLEURAL  Final   Special Requests NONE  Final   Gram Stain   Final    MODERATE WBC PRESENT, PREDOMINANTLY MONONUCLEAR NO ORGANISMS SEEN    Report Status 05/12/2015 FINAL  Final  Acid Fast Smear (AFB)     Status: None   Collection Time: 05/12/15  5:14 PM  Result Value Ref Range Status   AFB Specimen Processing Concentration  Final   Acid Fast Smear Negative  Final    Comment: (NOTE) Performed At: [facility] [address] [place], [place] [id number] Mila Homer MD NW:[id number]    Source (AFB) FLUID  Final    Comment: LEFT PLEURAL      Studies:   Recent x-ray studies have been reviewed in detail by the Attending Physician  Scheduled Meds:  Scheduled Meds: . antiseptic oral rinse  7 mL Mouth Rinse BID  . bisacodyl  10 mg Oral Daily  . Chlorhexidine Gluconate Cloth  6 each Topical Q0600  . enoxaparin (LOVENOX) injection  40 mg Subcutaneous Daily  . furosemide  40 mg Oral BID  . methadone  50 mg Oral Daily  . mometasone-formoterol  2 puff Inhalation BID  . mupirocin ointment  1 application Nasal BID  . senna-docusate  1 tablet Oral QHS  . spironolactone  25 mg Oral BID  . tiotropium  18 mcg Inhalation Daily    Time spent on care of this patient: 35 mins   MCCLUNG,JEFFREY T , MD   [facility] Office  [phone] Pager - Text Page per Loretha Stapler as per below:  On-Call/Text Page:      [email or web address]      password [email or web address]  If 7PM-7AM, please contact night-coverage [email or web address] Password [email or web address] 05/14/2015, 9:28 AM   LOS: 6 days

## 2015-05-14 NOTE — Progress Notes (Signed)
Left pleurex drained per MD order. Drained 175cc sanguinous fluid. Patient tolerated procedure well.

## 2015-05-15 ENCOUNTER — Inpatient Hospital Stay (HOSPITAL_COMMUNITY): Payer: Medicaid Other

## 2015-05-15 LAB — CBC
HEMATOCRIT: 35.4 % — AB (ref 39.0–52.0)
HEMOGLOBIN: 11.1 g/dL — AB (ref 13.0–17.0)
MCH: 28.8 pg (ref 26.0–34.0)
MCHC: 31.4 g/dL (ref 30.0–36.0)
MCV: 91.9 fL (ref 78.0–100.0)
Platelets: 140 10*3/uL — ABNORMAL LOW (ref 150–400)
RBC: 3.85 MIL/uL — AB (ref 4.22–5.81)
RDW: 15.8 % — ABNORMAL HIGH (ref 11.5–15.5)
WBC: 7.2 10*3/uL (ref 4.0–10.5)

## 2015-05-15 LAB — COMPREHENSIVE METABOLIC PANEL
ALT: 22 U/L (ref 17–63)
AST: 42 U/L — ABNORMAL HIGH (ref 15–41)
Albumin: 1.6 g/dL — ABNORMAL LOW (ref 3.5–5.0)
Alkaline Phosphatase: 137 U/L — ABNORMAL HIGH (ref 38–126)
Anion gap: 8 (ref 5–15)
BUN: 18 mg/dL (ref 6–20)
CHLORIDE: 101 mmol/L (ref 101–111)
CO2: 30 mmol/L (ref 22–32)
Calcium: 7.9 mg/dL — ABNORMAL LOW (ref 8.9–10.3)
Creatinine, Ser: 0.89 mg/dL (ref 0.61–1.24)
Glucose, Bld: 89 mg/dL (ref 65–99)
POTASSIUM: 4.4 mmol/L (ref 3.5–5.1)
SODIUM: 139 mmol/L (ref 135–145)
Total Bilirubin: 0.6 mg/dL (ref 0.3–1.2)
Total Protein: 4.9 g/dL — ABNORMAL LOW (ref 6.5–8.1)

## 2015-05-15 LAB — PHOSPHORUS: Phosphorus: 2 mg/dL — ABNORMAL LOW (ref 2.5–4.6)

## 2015-05-15 LAB — MAGNESIUM: Magnesium: 1.5 mg/dL — ABNORMAL LOW (ref 1.7–2.4)

## 2015-05-15 MED ORDER — SPIRONOLACTONE 25 MG PO TABS: 25.0000 mg | ORAL_TABLET | Freq: Two times a day (BID) | ORAL | Status: DC

## 2015-05-15 MED ORDER — KETOROLAC TROMETHAMINE 10 MG PO TABS: 10.0000 mg | ORAL_TABLET | Freq: Four times a day (QID) | ORAL | Status: DC | PRN

## 2015-05-15 MED ORDER — FUROSEMIDE 40 MG PO TABS: 40.0000 mg | ORAL_TABLET | Freq: Two times a day (BID) | ORAL | Status: DC

## 2015-05-15 MED ORDER — TIOTROPIUM BROMIDE MONOHYDRATE 18 MCG IN CAPS: 18.0000 ug | ORAL_CAPSULE | Freq: Every day | RESPIRATORY_TRACT | Status: DC

## 2015-05-15 NOTE — Progress Notes (Signed)
05/15/2015 2:30 PM Paged Denny PeonErin PA for CT to clarify drainage orders for pleurex.  Orders are to drain every other day. Pleurex drainage procedure discussed with pt's, wife.  I went step by step with her through the whole procedure.  Wife states she should have no problem with draining the catheter. Kathryne HitchAllen, Kaelin Bonelli C

## 2015-05-15 NOTE — Evaluation (Signed)
Physical Therapy Evaluation Patient Details Name: Benjamin Bell MRN: [id number] DOB: 05-28-1955 Today's Date: 05/15/2015   History of Present Illness  Benjamin Bell is a 60 y/o man with past medical history significant for COPD and recurrent left sided pleural effusion. He is followed by Dr. Kendrick FriesMcQuaid for this. [facility] was [facility] via EMS after calling and complaining of increasing shortness of breath. He was noted to have sats of 68% on RA. He was given albuterol, atrovent and solumedrol by EMS. A chest x-ray done in the ED showed increasing right sided pleural effusion.   Clinical Impression  Pt admitted with above diagnosis. Pt currently with functional limitations due to the deficits listed below (see PT Problem List). Pt ambulated 200' with RW and min guard A, O2 sats 93% to 89% on 2L O2.  Pt will benefit from skilled PT to increase their independence and safety with mobility to allow discharge to the venue listed below.       Follow Up Recommendations Home health PT;Supervision - Intermittent    Equipment Recommendations  Other (comment) (rollator) , with seat   Recommendations for Other Services       Precautions / Restrictions Restrictions Weight Bearing Restrictions: No      Mobility  Bed Mobility Overal bed mobility: Modified Independent             General bed mobility comments: pt able to get to EOB without physical assist  Transfers Overall transfer level: Needs assistance Equipment used: Rolling walker (2 wheeled) Transfers: Sit to/from Stand Sit to Stand: Supervision         General transfer comment: supervision from bed and toilet, no physical assist needed, pt uses RW to steady  Ambulation/Gait Ambulation/Gait assistance: Supervision Ambulation Distance (Feet): 200 Feet Assistive device: Rolling walker (2 wheeled) Gait Pattern/deviations: Step-through pattern;Decreased stride length Gait velocity: decreased Gait velocity interpretation: Below  normal speed for age/gender General Gait Details: pt with decreased gait speed, and O2 sat decrease from 93% to 89% on 2L O2. Safe with RW, would benefit from rollator  Stairs            Wheelchair Mobility    Modified Rankin (Stroke Patients Only)       Balance Overall balance assessment: Needs assistance Sitting-balance support: Feet supported Sitting balance-Leahy Scale: Good     Standing balance support: No upper extremity supported Standing balance-Leahy Scale: Fair Standing balance comment: relies on UE support for dynamic activity                             Pertinent Vitals/Pain Pain Assessment: Faces Faces Pain Scale: Hurts little more Pain Location: did not state Pain Descriptors / Indicators: Grimacing Pain Intervention(s): Limited activity within patient's tolerance         See gait  Home Living Family/patient expects to be discharged to:: Private residence Living Arrangements: Spouse/significant other Available Help at Discharge: Family;Available 24 hours/day Type of Home: Apartment Home Access: Stairs to enter Entrance Stairs-Rails: None Entrance Stairs-Number of Steps: 2 Home Layout: One level Home Equipment: Walker - 2 wheels;Bedside commode      Prior Function Level of Independence: Independent with assistive device(s)         Comments: wife with him all the time as well as roommate     Hand Dominance   Dominant Hand: Right    Extremity/Trunk Assessment   Upper Extremity Assessment: Generalized weakness  Lower Extremity Assessment: Generalized weakness      Cervical / Trunk Assessment: Normal  Communication   Communication: No difficulties  Cognition Arousal/Alertness: Awake/alert Behavior During Therapy: WFL for tasks assessed/performed Overall Cognitive Status: Within Functional Limits for tasks assessed                      General Comments General comments (skin integrity, edema, etc.):  discussed pt purchasing pulse ox for home monitoring.    Exercises        Assessment/Plan    PT Assessment Patient needs continued PT services  PT Diagnosis Difficulty walking;Generalized weakness   PT Problem List Decreased strength;Decreased activity tolerance;Decreased mobility;Cardiopulmonary status limiting activity;Decreased knowledge of precautions  PT Treatment Interventions DME instruction;Gait training;Stair training;Functional mobility training;Therapeutic activities;Therapeutic exercise;Balance training;Patient/family education   PT Goals (Current goals can be found in the Care Plan section) Acute Rehab PT Goals Patient Stated Goal: return home PT Goal Formulation: With patient Time For Goal Achievement: 05/29/15 Potential to Achieve Goals: Good    Frequency Min 3X/week   Barriers to discharge        Co-evaluation               End of Session Equipment Utilized During Treatment: Gait belt;Oxygen Activity Tolerance: Patient tolerated treatment well Patient left: in chair;with call bell/phone within reach;with family/visitor present Nurse Communication: Mobility status         Time: 1610-9604 PT Time Calculation (min) (ACUTE ONLY): 29 min   Charges:   PT Evaluation $PT Eval Moderate Complexity: 1 Procedure PT Treatments $Gait Training: 8-22 mins   PT G Codes:      Lyanne Co, PT  Acute Rehab Services  [phone]   Lyanne Co 05/15/2015, 12:04 PM

## 2015-05-15 NOTE — Progress Notes (Signed)
      [address]       [place] [id number]             [phone]      3 Days Post-Op Procedure(s) (LRB): VIDEO ASSISTED THORACOSCOPY (Left) DRAINAGE OF PLEURAL EFFUSION, MECHANICAL PLEURALDESIS, PLEURAL BIOPSIES (Left) VIDEO BRONCHOSCOPY (N/A) INSERTION PLEURAX CATH (N/A)   Subjective:  No new complaints.  States he is feeling better.  Objective: Vital signs in last 24 hours: Temp:  [97.1 F (36.2 C)-100 F (37.8 C)] 98.4 F (36.9 C) (03/18 0410) Pulse Rate:  [101-109] 107 (03/18 0410) Cardiac Rhythm:  [-] Normal sinus rhythm;Sinus tachycardia (03/17 1930) Resp:  [11-18] 18 (03/18 0410) BP: (100-113)/(59-77) 113/68 mmHg (03/18 0410) SpO2:  [91 %-96 %] 93 % (03/18 0410) Weight:  [140 lb 6.4 oz (63.685 kg)] 140 lb 6.4 oz (63.685 kg) (03/18 0410)  Intake/Output from previous day: 03/17 0701 - 03/18 0700 In: 870 [P.O.:840; I.V.:30] Out: 450 [Urine:450]  General appearance: alert, cooperative and no distress Heart: regular rate and rhythm Lungs: diminished breath sounds left base Abdomen: soft, non-tender; bowel sounds normal; no masses,  no organomegaly Wound: clean and dry  Lab Results:  Recent Labs  05/14/15 0515 05/15/15 0508  WBC 7.9 7.2  HGB 11.0* 11.1*  HCT 35.3* 35.4*  PLT 147* 140*   BMET:  Recent Labs  05/14/15 0515 05/15/15 0508  NA 137 139  K 4.2 4.4  CL 99* 101  CO2 34* 30  GLUCOSE 108* 89  BUN 24* 18  CREATININE 1.02 0.89  CALCIUM 7.6* 7.9*    PT/INR:  Recent Labs  05/13/15 0420  LABPROT 18.6*  INR 1.55*   ABG    Component Value Date/Time   PHART 7.475* 05/11/2015 2042   HCO3 32.4* 05/11/2015 2042   TCO2 33.8 05/11/2015 2042   ACIDBASEDEF 3.1* 03/27/2015 0115   O2SAT 93.3 05/11/2015 2042   CBG (last 3)  No results for input(s): GLUCAP in the last 72 hours.  Assessment/Plan: S/P Procedure(s) (LRB): VIDEO ASSISTED THORACOSCOPY (Left) DRAINAGE OF PLEURAL EFFUSION, MECHANICAL PLEURALDESIS, PLEURAL  BIOPSIES (Left) VIDEO BRONCHOSCOPY (N/A) INSERTION PLEURAX CATH (N/A)  1. Pleural Effusion- Pleur-x in place, per patient was drained yesterday, however there is no documentation on this or how much output was obtained... Will drain today 2. Pulm- CXR shows residual left sided pleural effusion 3. Dispo- care per primary, will have office contact with follow up appointment   LOS: 7 days    Lowella DandyBARRETT, Samatha Anspach 05/15/2015

## 2015-05-15 NOTE — Progress Notes (Addendum)
Received a call from [place], [place], she states that pt is ready to be d/c today and she wants to make sure that [facility] and PT has been arranged. Per previous CM notes, pt wants to use [facility], but [facility] was called for the referral. Met with pt and wife to clarify the [facility] agency. Pt reports that he doesn't want to use the same [facility] agency that he had before. They don't know the name of the agency. Wife reports that the nurse was rude. Contacted Tiffany at [facility] and she reports that pt is active with them. Informed her that pt and wife are not satisfied with the nurse. Asked Tiffany if they could send another nurse and she stated that they can. Met with pt again and he agreed to continue to use [facility] if they send another nurse and he wants to make sure that the nurse is not afraid of dogs. Tiffany made aware of pt's request. Pt needs a rollator. Mellody Dance at [facility] for DME referral.

## 2015-05-15 NOTE — Discharge Summary (Signed)
Physician Discharge Summary  Benjamin Bell ZOX:[id number] DOB: 14-Apr-1955 DOA: 05/08/2015  PCP: No primary care provider on file.  Admit date: 05/08/2015 Discharge date: 05/15/2015  Time spent: 20 minutes  Recommendations for Outpatient Follow-up:  1. Follow up with PCP in 2-3 weeks 2. Follow up with CT surgery as scheduled  3. Please repeat MRI abd with and w/o contrast in 3 months to follow liver lesion  Discharge Diagnoses:  Active Problems:   Hepatic cirrhosis (HCC)   Recurrent left pleural effusion   COPD exacerbation (HCC)   Methadone maintenance therapy patient (HCC)   Cirrhosis of liver with ascites (HCC)   Pleural effusion on left   Essential hypertension   Ascites   Pleural effusion   SOB (shortness of breath)   Alcohol abuse   Chronic pain syndrome   Discharge Condition: Improved  Diet recommendation: Regular  Filed Weights   05/13/15 0600 05/14/15 0700 05/15/15 0410  Weight: 71.3 kg (157 lb 3 oz) 60.056 kg (132 lb 6.4 oz) 63.685 kg (140 lb 6.4 oz)    History of present illness:  Please review dictated H and P from 3/11 for details. Briefly, 60 y/o M Hx COPD and a recurrent L pleural effusion followed by Dr. Kendrick Fries who was brought to the [facility] ED via EMS w/ increasing shortness of breath. He was noted to have sats of 68% on RA. He was given albuterol, atrovent and solumedrol by EMS. A chest x-ray in the ED showed an increasing right sided pleural effusion.  Hospital Course:  Recurrent Lt Pleural effusion S/P thoracentesis and now VATS - pleural fluid transudative - now has Pleurex w/ plans to drain intermittently - Discussed with CT surgery and pt is cleared for discharge - Home health arranged  Fall w/ rib fx, PTX, Hemothorax Jan 2017 eventually required chest tube   EtOH abuse/Hepatic cirrhosis - MELD 11 cirrhotic liver noted on imaging - acute hepatitis panel negative except for HCV ab (known)   [id number] R hepatic dome lesion  MRI abd with 1.3cm  lesion with features of dysplastic nodule with recommendation for MRI without and with contrast in 3 months  Polysubstance abuse  On chronic methadone - do NOT use additional narcotic  Hepatitis C s/p tx  No evidence of hepatitis presently   Episodic BRBPR Appears to have been a self-limited mild issue - GI has signed off   COPD Well compensated at this time - follow on current med tx   HTN BP currently controlled - follow   Chronic pain syndrome (neck pain) Methadone 50 mg daily - reports daily maintenance through [facility] - follow QTc - pain well controlled at this time   Procedures: 3/12 left thoracentesis > 1.6 L cloudy yellow fluid 3/15 VATS - mechanical pleuradesis, pleural bx, insertion Pleurex cath   Consultations: [facility]  TCTS  Discharge Exam: Filed Vitals:   05/14/15 1544 05/14/15 1658 05/14/15 1946 05/15/15 0410  BP: 108/68 106/59 101/59 113/68  Pulse: 105 103 103 107  Temp: 98.9 F (37.2 C) 98.3 F (36.8 C) 98.3 F (36.8 C) 98.4 F (36.9 C)  TempSrc: Oral Oral Oral Oral  Resp: Height:      Weight:    63.685 kg (140 lb 6.4 oz)  SpO2: 91% 93%  93%    General: awake, in nad Cardiovascular: regular, s1, s2 Respiratory: normal resp effort, no wheeizng  Discharge Instructions     Medication List    STOP taking these  medications        amLODipine 10 MG tablet  Commonly known as:  NORVASC     aspirin 81 MG chewable tablet     ibuprofen 600 MG tablet  Commonly known as:  ADVIL,MOTRIN     lisinopril-hydrochlorothiazide 20-25 MG tablet  Commonly known as:  PRINZIDE,ZESTORETIC      TAKE these medications        albuterol 108 (90 Base) MCG/ACT inhaler  Commonly known as:  PROVENTIL HFA;VENTOLIN HFA  Inhale 1-2 puffs into the lungs every 4 (four) hours as needed for wheezing or shortness of breath.     furosemide 40 MG tablet  Commonly known as:  LASIX  Take 1 tablet (40 mg total) by mouth 2 (two) times daily.      ketorolac 10 MG tablet  Commonly known as:  TORADOL  Take 1 tablet (10 mg total) by mouth every 6 (six) hours as needed.     MENS MULTI VITAMIN & MINERAL PO  Take 1 tablet by mouth daily.     methadone 10 MG/5ML solution  Commonly known as:  DOLOPHINE  Take 35.5 mLs (71 mg total) by mouth daily.     mometasone-formoterol 100-5 MCG/ACT Aero  Commonly known as:  DULERA  Inhale 2 puffs into the lungs 2 (two) times daily.     potassium chloride 20 MEQ packet  Commonly known as:  KLOR-CON  Take 20 mEq by mouth daily.     spironolactone 25 MG tablet  Commonly known as:  ALDACTONE  Take 1 tablet (25 mg total) by mouth 2 (two) times daily.     tiotropium 18 MCG inhalation capsule  Commonly known as:  SPIRIVA  Place 1 capsule (18 mcg total) into inhaler and inhale daily.       No Known Allergies     Follow-up Information    Follow up with [facility].   Why:  Home Health RN   Contact information:   [address] [place] [address] [phone]       Follow up with [facility] On 05/25/2015.   Why:  Transitional Care Clinic appointment on 05/25/15 at 3:15 pm with Dr. Carleene Overlie information:   [address] [place] [address] [phone]       The results of significant diagnostics from this hospitalization (including imaging, microbiology, ancillary and laboratory) are listed below for reference.    Significant Diagnostic Studies: Dg Chest 1 View  05/09/2015  CLINICAL DATA:  Pleural effusion, post left thoracentesis EXAM: CHEST 1 VIEW COMPARISON:  05/08/2015 FINDINGS: Slight decrease in the large left pleural effusion following thoracentesis. No pneumothorax. Left lower lobe atelectasis or infiltrate. Heart is likely mildly enlarged. No confluent opacity on the right. IMPRESSION: Slight decreased size of the large left pleural effusion following thoracentesis. No pneumothorax.  Electronically Signed   By: Charlett Nose M.D.   On: 05/09/2015 12:18   Dg Chest 2 View  05/15/2015  CLINICAL DATA:  Postoperative for thoracoscopy EXAM: CHEST  2 VIEW COMPARISON:  Chest radiograph from one day prior. FINDINGS: Slightly low lung volumes. Stable cardiomediastinal silhouette with normal heart size. No pneumothorax. Small to moderate left pleural effusion is increased. No right pleural effusion. Patchy left lung base opacities appear unchanged. IMPRESSION: 1. Increased small to moderate left pleural effusion. 2. Stable patchy left lung base opacity, representing pneumonia and/or atelectasis. Electronically Signed   By: Jannifer Rodney.D.  On: 05/15/2015 10:51   Dg Chest 2 View  05/14/2015  CLINICAL DATA:  Post VATS 2 days ago.  Chest soreness EXAM: CHEST  2 VIEW COMPARISON:  05/13/2015 FINDINGS: Interval removal of left chest tube. No pneumothorax. There is hyperinflation of the lungs compatible with COPD. Left basilar opacity likely reflects atelectasis. Small left pleural effusion. No confluent opacity on the right. Heart is normal size. Right central line is unchanged. IMPRESSION: Interval removal of left chest tube without visible pneumothorax. Left base atelectasis with small left effusion. Electronically Signed   By: Charlett NoseKevin  Dover M.D.   On: 05/14/2015 09:00   Dg Chest 2 View  05/08/2015  CLINICAL DATA:  Shortness of breath EXAM: CHEST  2 VIEW COMPARISON:  04/13/2015 FINDINGS: Interval progression of left pleural effusion which is now moderate to large in character. Right lung appears hyperexpanded. Cardiopericardial silhouette is obscured. Interstitial markings are diffusely coarsened with chronic features. Multiple nonacute posterior left rib fractures noted. IMPRESSION: Interval progression of left pleural effusion, now moderate to large in size. Electronically Signed   By: Kennith CenterEric  Mansell M.D.   On: 05/08/2015 15:49   Dg Cervical Spine Complete  05/08/2015  CLINICAL DATA:  Recent  left-sided rib fracture. Progressive shortness of breath for 2 hours. EXAM: CERVICAL SPINE - COMPLETE 4+ VIEW COMPARISON:  None. FINDINGS: Mild reversal of normal lordosis is identified. Minimal anterolisthesis of C2 versus C3 and C3 versus C4 is stable. The pre odontoid space and prevertebral soft tissues are normal. No fractures. The neural foramina are patent. Dedicated views of the odontoid process are normal. The lateral masses of C1 align with C2. The cervical spine is only seen to the bottom of C6 on the lateral view. Chronic calcifications are identified in the neck. IMPRESSION: 1. Limited study only seen to the bottom of C6 on the lateral view. No interval change or acute abnormality identified. 2. Carotid calcifications. Electronically Signed   By: Gerome Samavid  Williams III M.D   On: 05/08/2015 15:53   Mr Abdomen Wo Contrast  05/15/2015  CLINICAL DATA:  Indeterminate liver lesions.  Cirrhosis.  Inpatient EXAM: MRI ABDOMEN WITHOUT CONTRAST TECHNIQUE: Multiplanar multisequence MR imaging was performed without the administration of intravenous contrast. COMPARISON:  05/11/2015 abdominal sonogram and 03/07/2015 CT abdomen/ pelvis. FINDINGS: Motion degraded examination. Lower chest: Small left greater than right bilateral pleural effusions. Hepatobiliary: The liver surface is diffusely irregular in contour and there is relative hypertrophy of the left liver lobe, consistent with cirrhosis. Mild diffuse hepatic steatosis. There is a 1.3 x 1.3 cm liver mass at the right liver dome and segment 8 (series 3/image 28), which demonstrates T1 hyperintensity and T2 hypointensity. There is a geographic region of T2 hyperintensity throughout segment 8 of the right liver lobe, suggestive of confluent hepatic fibrosis. There is a 0.5 cm T2 hyperintense liver lesion in segment 4A of the left liver lobe (series 13/ image 17). No additional liver lesions. Mildly distended gallbladder with mild diffuse gallbladder wall thickening  and solitary layering 6 mm gallstone. No significant pericholecystic fluid. Mild diffuse central intrahepatic biliary ductal dilatation. Prominently dilated common bile duct, 16 mm diameter, unchanged since 03/07/2015. There is sharp tapering of the lower third of the common bile duct, unchanged. No convincing filling defects in the biliary tree. No gross evidence of a biliary or ampullary mass. Pancreas: No pancreatic mass or duct dilation.  No pancreas divisum. Spleen: Mild splenomegaly (craniocaudal splenic length 13.0 cm), decreased from 15.0 cm on 03/07/2015. No splenic mass. Adrenals/Urinary  Tract: Normal right adrenal. There is a 1.5 cm left adrenal nodule (series 13/ image 23), unchanged in size since 03/07/2015, which does not demonstrate signal loss on out of phase chemical shift imaging and remains indeterminate. No hydronephrosis. Stable 1.4 cm parenchymal calcification in the posterior upper left kidney. There are a few subcentimeter T2 hyperintense lesions throughout both kidneys, too small to characterize. Stomach/Bowel: Grossly normal stomach. Visualized small and large bowel is normal caliber, with no bowel wall thickening. Vascular/Lymphatic: Normal caliber abdominal aorta. Normal flow voids in the portal, splenic and renal veins. No pathologically enlarged lymph nodes in the abdomen. Other: Small volume perihepatic ascites.  Trace perisplenic ascites. Musculoskeletal: No aggressive appearing focal osseous lesions. Stable L4 vertebral compression fracture. Moderate L3 vertebral compression fracture appears new since 03/07/2015. Stable severe T9 vertebral compression fracture. IMPRESSION: 1. Limited motion degraded inpatient MRI study. 2. Cirrhosis with confluent hepatic fibrosis in the right liver lobe. 3. Segment 8 right liver lobe 1.3 cm mass, incompletely characterized and indeterminate, with MRI features suggestive of a dysplastic nodule, however hepatocellular carcinoma cannot be excluded on  the basis of this scan. Correlate with serum AFP level. A follow-up MRI abdomen without and with intravenous contrast is recommended in 3 months. 4. Small volume perihepatic and trace perisplenic ascites. Mild splenomegaly, decreased since 03/07/2015. 5. Indeterminate 1.5 cm left adrenal nodule, unchanged in size since 03/07/2015, recommend attention on follow-up imaging. 6. Moderate L3 vertebral compression fracture appears new since 03/07/2015. Additional T9 and L4 vertebral compression fractures appear stable. 7. Distended gallbladder with solitary subcentimeter gallstone. No significant pericholecystic fluid. These MRI findings are indeterminate for the presence of acute cholecystitis, however a 05/11/2015 abdominal sonogram demonstrated no convincing findings for acute cholecystitis. If there is clinical concern for acute cholecystitis, consider further evaluation with hepatobiliary scintigraphy. 8. Prominently dilated common bile duct (16 mm diameter), unchanged since 03/07/2015. Sharp tapering of the lower third of the common bile duct, with no appreciable choledocholithiasis or biliary/ampullary mass. Ampullary stenosis cannot be excluded. Correlate with serum bilirubin levels. 9. Small left greater than right bilateral pleural effusions. Electronically Signed   By: Delbert Phenix M.D.   On: 05/15/2015 11:34   US Abdomen Complete  05/11/2015  CLINICAL DATA:  Cirrhosis with ascites, abdominal distention EXAM: ABDOMEN ULTRASOUND COMPLETE COMPARISON:  03/18/2015, 03/07/2015 FINDINGS: Gallbladder: Moderate gallbladder distention with a small echogenic non shadowing mobile focus measuring 10 mm in the fundus, suspect gallbladder sludge. No definite shadowing gallstones. Wall thickness is mildly thickened measuring 4.1 mm. Trace pericholecystic fluid. No Murphy sign elicited. Appearance of the gallbladder is likely secondary to chronic hepatic disease or cirrhosis. Common bile duct: Diameter: Diffusely dilated up  to 13 mm but similar to the prior CT 03/07/2015 no intrahepatic biliary dilatation. Liver: Mildly nodular liver surface compatible with hepatic cirrhosis. Enlargement of the left hepatic lobe noted. Heterogeneous hypoechoic hepatic lesion in the right hepatic dome measuring 19 x 15 x 22 mm, indeterminate by ultrasound. In the setting of cirrhosis, recommend further evaluation with nonemergent abdominal MRI. IVC: No abnormality visualized. Pancreas: Visualized portion unremarkable. Pancreatic duct is demonstrated measuring 2.6 mm. No change compared to the prior CT. Spleen: Size and appearance within normal limits. Right Kidney: Length: 11.8 cm. Small hypoechoic renal cysts throughout the right kidney 1 measuring 6 mm and a second measuring 13 mm. No renal obstruction or hydronephrosis. Left Kidney: Length: 12.6 mm. Echogenic shadowing stone measuring 18 mm in the upper pole correlating with the CT. No hydronephrosis. Mild cortical  thinning. Abdominal aorta: Aortic atherosclerosis noted. Negative for aneurysm. Distal aorta is obscured by bowel gas. Other findings: Small amount of perihepatic ascites. IMPRESSION: Evidence of hepatic cirrhosis and trace amount of perihepatic ascites. Right hepatic dome 2.2 cm hypoechoic solid lesion by ultrasound remains indeterminate. See above comment and recommendation for further evaluation non emergently. Chronic common bile duct dilatation. Moderately distended gallbladder with mild wall thickening and pericholecystic fluid and intraluminal mobile echogenic sludge. No Murphy sign or definite cholecystitis. Gallbladder appearance is likely secondary to chronic hepatic disease/hypoproteinemia. Incidental right renal cysts Left upper pole nephrolithiasis Aortic atherosclerosis Electronically Signed   By: Judie Petit.  Shick M.D.   On: 05/11/2015 18:36   Mr 3d Recon At Scanner  05/15/2015  CLINICAL DATA:  Indeterminate liver lesions.  Cirrhosis.  Inpatient EXAM: MRI ABDOMEN WITHOUT CONTRAST  TECHNIQUE: Multiplanar multisequence MR imaging was performed without the administration of intravenous contrast. COMPARISON:  05/11/2015 abdominal sonogram and 03/07/2015 CT abdomen/ pelvis. FINDINGS: Motion degraded examination. Lower chest: Small left greater than right bilateral pleural effusions. Hepatobiliary: The liver surface is diffusely irregular in contour and there is relative hypertrophy of the left liver lobe, consistent with cirrhosis. Mild diffuse hepatic steatosis. There is a 1.3 x 1.3 cm liver mass at the right liver dome and segment 8 (series 3/image 28), which demonstrates T1 hyperintensity and T2 hypointensity. There is a geographic region of T2 hyperintensity throughout segment 8 of the right liver lobe, suggestive of confluent hepatic fibrosis. There is a 0.5 cm T2 hyperintense liver lesion in segment 4A of the left liver lobe (series 13/ image 17). No additional liver lesions. Mildly distended gallbladder with mild diffuse gallbladder wall thickening and solitary layering 6 mm gallstone. No significant pericholecystic fluid. Mild diffuse central intrahepatic biliary ductal dilatation. Prominently dilated common bile duct, 16 mm diameter, unchanged since 03/07/2015. There is sharp tapering of the lower third of the common bile duct, unchanged. No convincing filling defects in the biliary tree. No gross evidence of a biliary or ampullary mass. Pancreas: No pancreatic mass or duct dilation.  No pancreas divisum. Spleen: Mild splenomegaly (craniocaudal splenic length 13.0 cm), decreased from 15.0 cm on 03/07/2015. No splenic mass. Adrenals/Urinary Tract: Normal right adrenal. There is a 1.5 cm left adrenal nodule (series 13/ image 23), unchanged in size since 03/07/2015, which does not demonstrate signal loss on out of phase chemical shift imaging and remains indeterminate. No hydronephrosis. Stable 1.4 cm parenchymal calcification in the posterior upper left kidney. There are a few subcentimeter  T2 hyperintense lesions throughout both kidneys, too small to characterize. Stomach/Bowel: Grossly normal stomach. Visualized small and large bowel is normal caliber, with no bowel wall thickening. Vascular/Lymphatic: Normal caliber abdominal aorta. Normal flow voids in the portal, splenic and renal veins. No pathologically enlarged lymph nodes in the abdomen. Other: Small volume perihepatic ascites.  Trace perisplenic ascites. Musculoskeletal: No aggressive appearing focal osseous lesions. Stable L4 vertebral compression fracture. Moderate L3 vertebral compression fracture appears new since 03/07/2015. Stable severe T9 vertebral compression fracture. IMPRESSION: 1. Limited motion degraded inpatient MRI study. 2. Cirrhosis with confluent hepatic fibrosis in the right liver lobe. 3. Segment 8 right liver lobe 1.3 cm mass, incompletely characterized and indeterminate, with MRI features suggestive of a dysplastic nodule, however hepatocellular carcinoma cannot be excluded on the basis of this scan. Correlate with serum AFP level. A follow-up MRI abdomen without and with intravenous contrast is recommended in 3 months. 4. Small volume perihepatic and trace perisplenic ascites. Mild splenomegaly, decreased since 03/07/2015.  5. Indeterminate 1.5 cm left adrenal nodule, unchanged in size since 03/07/2015, recommend attention on follow-up imaging. 6. Moderate L3 vertebral compression fracture appears new since 03/07/2015. Additional T9 and L4 vertebral compression fractures appear stable. 7. Distended gallbladder with solitary subcentimeter gallstone. No significant pericholecystic fluid. These MRI findings are indeterminate for the presence of acute cholecystitis, however a 05/11/2015 abdominal sonogram demonstrated no convincing findings for acute cholecystitis. If there is clinical concern for acute cholecystitis, consider further evaluation with hepatobiliary scintigraphy. 8. Prominently dilated common bile duct (16 mm  diameter), unchanged since 03/07/2015. Sharp tapering of the lower third of the common bile duct, with no appreciable choledocholithiasis or biliary/ampullary mass. Ampullary stenosis cannot be excluded. Correlate with serum bilirubin levels. 9. Small left greater than right bilateral pleural effusions. Electronically Signed   By: Delbert Phenix M.D.   On: 05/15/2015 11:34   Dg Chest Port 1 View  05/13/2015  CLINICAL DATA:  Pleural effusion and left chest tube EXAM: PORTABLE CHEST 1 VIEW COMPARISON:  05/12/2015 FINDINGS: Left chest tube remains unchanged in position. No pneumothorax. Minimal residual left pleural effusion unchanged. Left lower lobe airspace disease unchanged. Mild right lower lobe airspace disease improved. Right jugular catheter tip in the SVC. Improvement in vascular congestion IMPRESSION: Left chest tube remains in good position with minimal left effusion. No pneumothorax. Left lower lobe airspace disease unchanged Improvement in vascular congestion. Improvement in right lower lobe airspace disease. Electronically Signed   By: Marlan Palau M.D.   On: 05/13/2015 07:49   Dg Chest Port 1 View  05/12/2015  CLINICAL DATA:  Pleural effusion, status post chest tube placement. EXAM: PORTABLE CHEST 1 VIEW COMPARISON:  Chest x-rays dated 05/11/2015 and 05/09/2015. FINDINGS: Significantly decreased left pleural effusion status post left-sided chest tube placement, with improved aeration. Opacity persists at the left lung base, most likely a combination of atelectasis and small pleural effusion. Ill-defined opacities within the mid to lower right lung are most suggestive of mild edema. Cardiomediastinal silhouette appears stable in size and configuration. Right IJ central line has been placed with tip well positioned at the level of the lower SVC. No pneumothorax seen. IMPRESSION: 1. Significant decrease in the size of the left pleural effusion status post chest tube placement. Probable small residual  pleural effusion and atelectasis at the left lung base. 2. Ill-defined opacities within the mid to lower right lung most suggestive of mild edema. 3. Right IJ central line placed in the interval with tip well-positioned at the level of the lower SVC. No pneumothorax or other procedural complicating feature. Electronically Signed   By: Bary Richard M.D.   On: 05/12/2015 18:54   Dg Chest Port 1 View  05/11/2015  CLINICAL DATA:  Followup left pleural effusion and associated atelectasis and/or pneumonia in the left lower lobe. EXAM: PORTABLE CHEST 1 VIEW COMPARISON:  05/08/2005 and earlier. FINDINGS: Large left pleural effusion which may have increased a bit since the examination 2 days ago. Dense consolidation in the left lower lobe and inferior left upper lobe. Interval development of mild interstitial and airspace pulmonary edema throughout the right lung, associated with a small right pleural effusion. IMPRESSION: 1. Large left pleural effusion, slightly increased in size since the examination 2 days ago. 2. New mild interstitial and airspace pulmonary edema indicating CHF and/or fluid overload. Electronically Signed   By: Hulan Saas M.D.   On: 05/11/2015 08:29   US Thoracentesis Asp Pleural Space W/img Guide  05/09/2015  INDICATION: Symptomatic left sided pleural  effusion EXAM: US THORACENTESIS ASP PLEURAL SPACE W/IMG GUIDE COMPARISON:  None. MEDICATIONS: 10 cc 1% lidocaine COMPLICATIONS: None immediate. TECHNIQUE: Informed written consent was obtained from the patient after a discussion of the risks, benefits and alternatives to treatment. A timeout was performed prior to the initiation of the procedure. Initial ultrasound scanning demonstrates a left pleural effusion. The lower chest was prepped and draped in the usual sterile fashion. 1% lidocaine was used for local anesthesia. Under direct ultrasound guidance, a 19 gauge, 7-cm, Yueh catheter was introduced. An ultrasound image was saved for  documentation purposes. The thoracentesis was performed. The catheter was removed and a dressing was applied. The patient tolerated the procedure well without immediate post procedural complication. The patient was escorted to have an upright chest radiograph. FINDINGS: A total of approximately 1.6 liters of cloudy yellow fluid was removed. Requested samples were sent to the laboratory. IMPRESSION: Successful ultrasound-guided Left sided thoracentesis yielding 1.6 liters of pleural fluid. Read by: Robet Leu [facility] Electronically Signed   By: Malachy Moan M.D.   On: 05/09/2015 12:01    Microbiology: Recent Results (from the past 240 hour(s))  Blood culture (routine x 2)     Status: None   Collection Time: 05/08/15  4:40 PM  Result Value Ref Range Status   Specimen Description BLOOD RIGHT HAND  Final   Special Requests BOTTLES DRAWN AEROBIC AND ANAEROBIC 5CC  Final   Culture NO GROWTH 5 DAYS  Final   Report Status 05/13/2015 FINAL  Final  Blood culture (routine x 2)     Status: None   Collection Time: 05/08/15  5:29 PM  Result Value Ref Range Status   Specimen Description BLOOD RIGHT FOREARM  Final   Special Requests BOTTLES DRAWN AEROBIC AND ANAEROBIC 5CC  Final   Culture NO GROWTH 5 DAYS  Final   Report Status 05/13/2015 FINAL  Final  MRSA PCR Screening     Status: Abnormal   Collection Time: 05/09/15  7:38 AM  Result Value Ref Range Status   MRSA by PCR POSITIVE (A) NEGATIVE Final    Comment:        The GeneXpert MRSA Assay (FDA approved for NASAL specimens only), is one component of a comprehensive MRSA colonization surveillance program. It is not intended to diagnose MRSA infection nor to guide or monitor treatment for MRSA infections. RESULT CALLED TO, READ BACK BY AND VERIFIED WITH: A. MILLS RN 05/09/15 AT 0929 BY A. DAVIS   Culture, body fluid-bottle     Status: None   Collection Time: 05/09/15 11:47 AM  Result Value Ref Range Status   Specimen Description FLUID  PLEURAL LEFT  Final   Special Requests NONE  Final   Culture NO GROWTH 5 DAYS  Final   Report Status 05/14/2015 FINAL  Final  Gram stain     Status: None   Collection Time: 05/09/15 11:47 AM  Result Value Ref Range Status   Specimen Description FLUID PLEURAL LEFT  Final   Special Requests NONE  Final   Gram Stain   Final    MODERATE WBC PRESENT,BOTH PMN AND MONONUCLEAR NO ORGANISMS SEEN    Report Status 05/09/2015 FINAL  Final  Culture, body fluid-bottle     Status: None (Preliminary result)   Collection Time: 05/12/15  5:14 PM  Result Value Ref Range Status   Specimen Description FLUID LEFT PLEURAL  Final   Special Requests BOTTLES DRAWN AEROBIC AND ANAEROBIC 10CC  Final   Culture NO GROWTH 3  DAYS  Final   Report Status PENDING  Incomplete  Gram stain     Status: None   Collection Time: 05/12/15  5:14 PM  Result Value Ref Range Status   Specimen Description FLUID LEFT PLEURAL  Final   Special Requests NONE  Final   Gram Stain   Final    MODERATE WBC PRESENT, PREDOMINANTLY MONONUCLEAR NO ORGANISMS SEEN    Report Status 05/12/2015 FINAL  Final  Acid Fast Smear (AFB)     Status: None   Collection Time: 05/12/15  5:14 PM  Result Value Ref Range Status   AFB Specimen Processing Concentration  Final   Acid Fast Smear Negative  Final    Comment: (NOTE) Performed At: [facility] [address] [place], [place] [id number] Mila Homer MD JX:[id number]    Source (AFB) FLUID  Final    Comment: LEFT PLEURAL      Labs: Basic Metabolic Panel:  Recent Labs Lab 05/11/15 0335 05/11/15 2030 05/12/15 0540 05/13/15 0420 05/14/15 0515 05/15/15 0508  NA 143 141 142 140 137 139  K 3.9 4.5 4.1 4.7 4.2 4.4  CL 102 100* 101 100* 99* 101  CO2 29 33* 31 33* 34* 30  GLUCOSE 86 150* 75 60* 108* 89  BUN 26* 22* 22* 22* 24* 18  CREATININE 1.16 1.10 0.99 1.04 1.02 0.89  CALCIUM 8.0* 8.2* 8.0* 8.1* 7.6* 7.9*  MG 1.7  --  2.1  --   --  1.5*  PHOS 1.8*  --  3.2  --   --   2.0*   Liver Function Tests:  Recent Labs Lab 05/10/15 0624 05/11/15 2030 05/13/15 0420 05/15/15 0508  AST 53* 60* 50* 42*  ALT 20 26 22 22   ALKPHOS 151* 175* 130* 137*  BILITOT 1.1 0.7 0.9 0.6  PROT 5.9* 6.5 5.3* 4.9*  ALBUMIN 1.8* 2.0* 1.8* 1.6*   No results for input(s): LIPASE, AMYLASE in the last 168 hours. No results for input(s): AMMONIA in the last 168 hours. CBC:  Recent Labs Lab 05/11/15 2030 05/12/15 0540 05/13/15 0420 05/14/15 0515 05/15/15 0508  WBC 6.8 6.9 10.4 7.9 7.2  HGB 13.9 12.8* 12.3* 11.0* 11.1*  HCT 43.3 40.4 40.3 35.3* 35.4*  MCV 92.1 91.8 93.3 91.7 91.9  PLT 171 158 177 147* 140*   Cardiac Enzymes: No results for input(s): CKTOTAL, CKMB, CKMBINDEX, TROPONINI in the last 168 hours. BNP: BNP (last 3 results)  Recent Labs  03/26/15 1738  BNP 125.1*    ProBNP (last 3 results) No results for input(s): PROBNP in the last 8760 hours.  CBG: No results for input(s): GLUCAP in the last 168 hours.   Signed:  CHIU, Scheryl Marten  [facility] 05/15/2015, 5:28 PM

## 2015-05-17 ENCOUNTER — Telehealth: Payer: Self-pay

## 2015-05-17 LAB — CULTURE, BODY FLUID W GRAM STAIN -BOTTLE

## 2015-05-17 LAB — CULTURE, BODY FLUID-BOTTLE: CULTURE: NO GROWTH

## 2015-05-17 MED ORDER — TIOTROPIUM BROMIDE MONOHYDRATE 18 MCG IN CAPS: 18.0000 ug | ORAL_CAPSULE | Freq: Every day | RESPIRATORY_TRACT | Status: DC

## 2015-05-17 MED ORDER — KETOROLAC TROMETHAMINE 10 MG PO TABS: 10.0000 mg | ORAL_TABLET | Freq: Four times a day (QID) | ORAL | Status: DC | PRN

## 2015-05-17 MED ORDER — SPIRONOLACTONE 25 MG PO TABS: 25.0000 mg | ORAL_TABLET | Freq: Two times a day (BID) | ORAL | Status: DC

## 2015-05-17 MED ORDER — FUROSEMIDE 40 MG PO TABS: 40.0000 mg | ORAL_TABLET | Freq: Two times a day (BID) | ORAL | Status: DC

## 2015-05-17 NOTE — Telephone Encounter (Signed)
[facility] Post-discharge Follow-Up Phone Call:  Date of Discharge: 05/15/2015 Principal Discharge Diagnosis(es): hepatic cirrhosis, recurrent left pleural effusion, COPD exacerbation, methadone maintenance.  Post-discharge Communication: This CM spoke to the patient and then he gave the phone to his wife, Burna MortimerWanda to complete the call and review the medications. Call Completed: Yes                   With Whom: Patient and his wife, Burna MortimerWanda Interpreter Needed: No     Please check all that apply:  X Patient is knowledgeable of his/her condition(s) and/or treatment. The patient stated that he is " doing okay" and his wife stated that he is " doing super."   He noted that he is supposed to drain his pleurex catheter every other day and he drained it today - 150ml.  Instructed him to keep a log of the days he drains it and the amount that is drained. He said that he has received the necessary supplies to perform the drainage and was instructed how to do the procedure while he was in the hospital. ? Patient is caring for self at home.  X Patient is receiving assist at home from family and/or caregiver. Family and/or caregiver is knowledgeable of patient's condition(s) and/or treatment. His wife stated that she provides any assistance he needs.   X  Patient is receiving home health services. If so, name of agency. -[facility]  - He stated that they called him last night and he was expecting a call today and has not received a call.  Provided him with the [facility].      Medication Reconciliation:  X  Medication list reviewed with patient. X  Patient obtained all discharge medications. If not, why? NO - He did not have his medications. Reviewed with the patient's wife, the medications that are to be stopped - amlodipine, aspirin, ibuprofen, lisinopril- hydrochlorothiazde. She said that she was not aware of what was to be stopped.  The patient stated that he goes to the methadone clinic - [facility] - every day and he had already been there this morning. The patient's wife said that she did not get his prescriptions for his medicines when he left the hospital. CM to call [facility] on [address]  to inquire if the prescriptions were sent there as it is noted as his pharmacy.  Burna MortimerWanda stated that he only has the albuterol inhaler, the dulera and the potassium.    Activities of Daily Living:  ? Independent X  Needs assist (describe; ? home DME used)  - has as walker, bedside commode  and O2 from [facility] as per Burna MortimerWanda.  She noted that he is currently using the O2 @ 2L continuously.  ? Total Care (describe, ? home DME used)   Community resources in place for patient:  ? None  ?XHome Health/Home DME - [facility]. He has medicaid and [facility].  ? Assisted Living ? Support Group          Patient Education:  Instructed regarding the importance of medication compliance and medical follow up.         Questions/Concerns discussed: [facility]. He reported no other questions/problems   [facility]  # [phone] and spoke to Htat in the pharmacy who stated that they do not have any prescriptions for the patient.  Call then placed to Isidoro Donning, RN CM regarding the fact the the patient did not get his prescriptions at discharge. She instructed this CM to call the charge nurse on 2W. Call then placed to [facility], charge nurse - 2W who stated that the patient left without the paper prescriptions and they had tried to call the patient to inform him that the prescriptions are on the unit; but they  did not get any response.  She said that she would discus with the doctor and try to have them sent to the [facility] on [address]. Provided her with this CM call back # if there are any further questions.   Call then placed to the patient  to inform him of the attempt to obtain his prescriptions.  A HIPAA compliant voice mail message was left requesting a call back to # [phone] or [phone].

## 2015-05-17 NOTE — Telephone Encounter (Signed)
Call placed to the patient and informed him that this CM spoke with the charge nurse on 2W and she is going to check with the doctor about [facility] [address].  The patient stated that he understood and he can check with the pharmacy later to make sure that the prescriptions were received.  He stated that they would be able to pick up the medications when they are ready.

## 2015-05-18 ENCOUNTER — Telehealth: Payer: Self-pay

## 2015-05-18 ENCOUNTER — Telehealth (HOSPITAL_COMMUNITY): Payer: Self-pay

## 2015-05-18 DIAGNOSIS — J939 Pneumothorax, unspecified: Secondary | ICD-10-CM | POA: Diagnosis not present

## 2015-05-18 NOTE — Telephone Encounter (Signed)
Call placed to the patient again to remind him of the importance of picking up his prescriptions at the hospital and he stated that he understood.  This CM spoke to [facility] Bell , RN CM who noted that [facility] is not able to provide the drains for the pleurex catheters because they are not covered by medicaid. She confirmed with the patient that he has enough supplies to drain the catheter 9 times  and he drains the catheter every other day. He has a follow up appointment with thoracic on 06/03/15. She also spoke to the patient's home care nurse with [facility] # [phone].

## 2015-05-18 NOTE — Telephone Encounter (Signed)
[facility] she was actually looking to ask questions from Dr. Tyrone SageGerhardt regarding his vital signs at home and regarding his pigtail catheter.    He was seen by the trauma service back in January, but has been since re-admitted and taken care of by the hospitalist service and cardiothoracic surgery after a VATS.  HH services still had Dr. Bobby Rumpfhompson/Dr. Lindie SpruceWyatt listed as PCP (which is incorrect).  I made the RN aware that we are not his PCP, but she was looking to get a hold of Dr. Tyrone SageGerhardt so I gave her their office number.

## 2015-05-18 NOTE — Progress Notes (Signed)
05-18-2015 15:37 Spoke with patients [facility] RN and wife. Updated on [employer] not being able to supply pleurX drains. Wife counted 9 drains at home that were sent with patient from the hospital. [facility] RN Bethanie Dicker met with patient today at home and agreed that 9 drains were available at the house. The patient has a F/U w/ cardio thoracic sx on 4/6. Drain is changed QOD therefore patient will use 8/9 drains prior to appointment. LaQuann RN checking with [facility] if they can bill supplies to medicaid and therefore be able to get more delivered to house. She stated she would call this CM back with results.

## 2015-05-18 NOTE — Telephone Encounter (Signed)
Call placed to the patient to inquire if he obtained his medications and he said that he has not and he called [facility] and they did not receive the prescription. The patient did inform this CM that the nurse from [facility] was at his home.   Call then placed to sandra, charge nurse 2W to  Inquire about the status of the prescriptions. She noted that the prescriptions were reprinted and were at the nurses' station waiting for the patient to pick up.  Call placed again to the patient. He was with the home health nurse and this CM informed his wife that the prescriptions were at the nurses' station waiting for pick up.  She said that she would try to get them today.

## 2015-05-18 NOTE — Progress Notes (Addendum)
05-18-2015 Patient discharged over the weekend. [facility] today [facility] ([phone]) that they cannot supply PleurX supplies due to patient being covered by [facility] [place]. Sje states that there are no companies that supply to Medicaid patients. Notified Laverta BaltimoreJane Brazeua RN [facility] as he is to follow up with them to establish PCP.

## 2015-05-23 ENCOUNTER — Telehealth: Payer: Self-pay | Admitting: *Deleted

## 2015-05-23 NOTE — Telephone Encounter (Signed)
[facility] who reports [facility] be [facility]" for Pleur-X. This is in reference to call she had received earlier this week by D.Swist, RNCM. No new or unresolved CM needs at this time.

## 2015-05-24 ENCOUNTER — Telehealth: Payer: Self-pay

## 2015-05-24 NOTE — Telephone Encounter (Signed)
Call placed to the patient and [facility], 05/[facility]/15 @ 1515 with Dr Venetia NightAmao. He stated that he would be there. This CM instructed the patient to bring all of his medications to the appointment and he stated that he would. He said that the home care nurse, or Burna MortimerWanda, his wife, have been draining his catheter and they have not had any problems. The patient stated that he is not sure how he will be getting to [facility] and this CM instructed him to call the [facility] office in the morning if he doesn't have transportation and a cab can be arranged. He then handed the phone to [place]Wanda to confirm the # for the [facility] and to inform her that a cab can be arranged if needed.  She was very Adult nurseappreciative.

## 2015-05-25 ENCOUNTER — Inpatient Hospital Stay: Payer: Medicaid Other | Admitting: Family Medicine

## 2015-05-26 ENCOUNTER — Telehealth: Payer: Self-pay | Admitting: Family Medicine

## 2015-05-26 NOTE — Telephone Encounter (Signed)
[facility] stating that the patient has a temperature of 99.58F and has new swelling on both lower extremities nurse would like a call back. Please f/u with pt.

## 2015-05-26 NOTE — Telephone Encounter (Deleted)
OK'ed continued orders to drain PleurX Cath. And

## 2015-05-27 ENCOUNTER — Other Ambulatory Visit: Payer: Self-pay | Admitting: *Deleted

## 2015-05-27 DIAGNOSIS — L03319 Cellulitis of trunk, unspecified: Secondary | ICD-10-CM

## 2015-05-27 MED ORDER — CEPHALEXIN 500 MG PO CAPS: 500.0000 mg | ORAL_CAPSULE | Freq: Three times a day (TID) | ORAL | Status: DC

## 2015-05-27 NOTE — Progress Notes (Signed)
Mr. Benjamin Bell[facility] called to relate that the site of his pleurX catheter has become more reddened than when he was discharged. Also, the other sutured site is reddened and has some yellow drainage.  He has a follow up appointment next week to see Dr. Tyrone SageGerhardt. After a discussion with Dr. Reece AgarG. A prescription was e-prescrided for Keflex 500 mg TID X 7 days.  I called his wife and informed her of this.  I also called his home nurse.  If no improvement is noted in 2 days he is to call the office and she understands.

## 2015-05-28 ENCOUNTER — Telehealth: Payer: Self-pay

## 2015-05-28 NOTE — Telephone Encounter (Signed)
This Case Manager placed call to [facility] [phone]) as she called clinic on 05/26/15 indicating patient has temperature of 99.8 and new lower extremity swelling.  Unable to reach [facility]; HIPPA compliant voicemail left requesting return call. In addition, call placed to patient to check on status. Spoke with patient's spouse who indicated patient not home; HIPPA compliant message left requesting patient return call to this Case Manager when able. Awaiting return calls.

## 2015-05-28 NOTE — Telephone Encounter (Signed)
This Case Manager received return call from patient. Inquired about patient's status. Patient indicated he was doing well and did not have any complaints. Patient indicated his wife and home care nurse had been draining his PleurX catheter without any problems. Inquired if patient had swelling in his lower extremities. Patient indicated he had some swelling in his lower extremities on 05/26/15 that his home health RN was concerned about. He indicated he has had swelling in his lower extremities in the past from "RA and gout." He indicated the swelling in his legs has now gone away. Patient indicated he was being compliant with his medications except Dulera which he did not have. Refills available on script. Encouraged patient to bring all medications to his upcoming appointment with Dr. Venetia NightAmao on 06/01/15. Patient verbalized understanding and indicated he will need transportation to his appointment. Clinic Scheduler aware of patient's need for transportation. No additional needs/concerns identified at this time.

## 2015-05-31 ENCOUNTER — Telehealth: Payer: Self-pay | Admitting: Family Medicine

## 2015-05-31 ENCOUNTER — Telehealth: Payer: Self-pay

## 2015-05-31 NOTE — Telephone Encounter (Signed)
Attempted to contact patient and rang 8 times and then cut off with no voicemail.  Patient has not been seen here and has an appointment tomorrow that he needs to keep in order to get his refills and to have his edema assessed.

## 2015-05-31 NOTE — Telephone Encounter (Signed)
Patient has swelling on legs..3+edema and 2+edema on the right... Patient needs dulera, albuterol, please send to Pharmacy....please follow up with patient

## 2015-05-31 NOTE — Telephone Encounter (Signed)
Attempted to contact the patient to check on his status and to remind him of [facility]. Call placed to # [phone] (H) 3 times and the call was dropped twice and the third time, the message noted that the person is not accepting calls at this time. Idelle Leechhadelle Brown, [facility] scheduler notified of the patients need for transportation to the clinic,

## 2015-05-31 NOTE — Telephone Encounter (Signed)
Left message for home health RN that patient has appointment at the clinic tomorrow and that we would see him then.  If his symptoms were what she considers an emergency he should go to the ED.

## 2015-06-01 ENCOUNTER — Encounter: Payer: Self-pay | Admitting: Internal Medicine

## 2015-06-01 ENCOUNTER — Ambulatory Visit: Payer: Medicaid Other | Attending: Family Medicine | Admitting: Family Medicine

## 2015-06-01 ENCOUNTER — Telehealth: Payer: Self-pay

## 2015-06-01 ENCOUNTER — Encounter: Payer: Self-pay | Admitting: Family Medicine

## 2015-06-01 VITALS — BP 134/81 | HR 105 | Temp 97.9°F | Resp 15 | Ht 71.0 in | Wt 145.4 lb

## 2015-06-01 DIAGNOSIS — K7031 Alcoholic cirrhosis of liver with ascites: Secondary | ICD-10-CM | POA: Diagnosis not present

## 2015-06-01 DIAGNOSIS — G894 Chronic pain syndrome: Secondary | ICD-10-CM

## 2015-06-01 DIAGNOSIS — J441 Chronic obstructive pulmonary disease with (acute) exacerbation: Secondary | ICD-10-CM | POA: Diagnosis not present

## 2015-06-01 DIAGNOSIS — Z79899 Other long term (current) drug therapy: Secondary | ICD-10-CM | POA: Insufficient documentation

## 2015-06-01 DIAGNOSIS — F1120 Opioid dependence, uncomplicated: Secondary | ICD-10-CM

## 2015-06-01 DIAGNOSIS — I10 Essential (primary) hypertension: Secondary | ICD-10-CM | POA: Insufficient documentation

## 2015-06-01 DIAGNOSIS — K219 Gastro-esophageal reflux disease without esophagitis: Secondary | ICD-10-CM | POA: Diagnosis not present

## 2015-06-01 DIAGNOSIS — S272XXS Traumatic hemopneumothorax, sequela: Secondary | ICD-10-CM

## 2015-06-01 DIAGNOSIS — Z0001 Encounter for general adult medical examination with abnormal findings: Secondary | ICD-10-CM | POA: Diagnosis present

## 2015-06-01 MED ORDER — MOMETASONE FURO-FORMOTEROL FUM 100-5 MCG/ACT IN AERO: 2.0000 | INHALATION_SPRAY | Freq: Two times a day (BID) | RESPIRATORY_TRACT | Status: DC

## 2015-06-01 MED ORDER — KETOROLAC TROMETHAMINE 10 MG PO TABS: 10.0000 mg | ORAL_TABLET | Freq: Four times a day (QID) | ORAL | Status: DC | PRN

## 2015-06-01 MED ORDER — TIOTROPIUM BROMIDE MONOHYDRATE 18 MCG IN CAPS: 18.0000 ug | ORAL_CAPSULE | Freq: Every day | RESPIRATORY_TRACT | Status: DC

## 2015-06-01 MED ORDER — OMEPRAZOLE 40 MG PO CPDR: 40.0000 mg | DELAYED_RELEASE_CAPSULE | Freq: Every day | ORAL | Status: DC

## 2015-06-01 MED ORDER — SPIRONOLACTONE 25 MG PO TABS: 25.0000 mg | ORAL_TABLET | Freq: Two times a day (BID) | ORAL | Status: DC

## 2015-06-01 NOTE — Progress Notes (Signed)
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

## 2015-06-01 NOTE — Progress Notes (Signed)
Subjective:  Patient ID: Benjamin Bell, male    DOB: 06-Jan-1956  Age: 60 y.o. MRN: [id number]  CC: Establish Care   HPI Benjamin Bell is a 60 year old male with a history of COPD (on 2 L of oxygen), recurrent L pleural effusion, hypertension, GERD, alcoholic cirrhosis of the liver with ascites who comes into the clinic for a follow-up from hospitalization at [facility] from 05/08/15 through 05/05/15.  He presented to the ED with increasing shortness of breath and was noted to have sats of 68% on RA. He was given albuterol, atrovent and solumedrol by EMS. A chest x-ray in the ED showed an interval progression of left pleural effusion now moderate to large in size for which he underwent thoracocentesis He was seen by cardiothoracic surgery and on 05/12/15 and underwent a video-assisted thoracoscopic drainage of pleural effusion, mechanical pleurodesis and pleural biopsy and insertion of Pleurx cath. Pleural biopsies from hospitalization were negative for malignancy. His condition improved was subsequently discharged with home health.  Today he denies shortness of breath but complains of pain at the site of the proximal And also at his neck and back. For his liver cirrhosis he is not currently under the care of cough a gastroenterologist ever since his GI-Dr. Marcelene Butte retired. Complains of pedal edema which is worse in the dependent position and review of his medications indicate he has been out of his spironolactone but has been taking his Lasix consistently.  He is followed by a methadone clinic  Outpatient Prescriptions Prior to Visit  Medication Sig Dispense Refill  . furosemide (LASIX) 40 MG tablet Take 1 tablet (40 mg total) by mouth 2 (two) times daily. 60 tablet 0  . methadone (DOLOPHINE) 10 MG/5ML solution Take 35.5 mLs (71 mg total) by mouth daily. 120 mL 0  . potassium chloride (KLOR-CON) 20 MEQ packet Take 20 mEq by mouth daily.    Marland Kitchen tiotropium (SPIRIVA) 18 MCG inhalation  capsule Place 1 capsule (18 mcg total) into inhaler and inhale daily. 30 capsule 0  . albuterol (PROVENTIL HFA;VENTOLIN HFA) 108 (90 Base) MCG/ACT inhaler Inhale 1-2 puffs into the lungs every 4 (four) hours as needed for wheezing or shortness of breath. Reported on 06/01/2015    . cephALEXin (KEFLEX) 500 MG capsule Take 1 capsule (500 mg total) by mouth 3 (three) times daily. (Patient not taking: Reported on 06/01/2015) 21 capsule 0  . Multiple Vitamins-Minerals (MENS MULTI VITAMIN & MINERAL PO) Take 1 tablet by mouth daily. Reported on 06/01/2015    . ketorolac (TORADOL) 10 MG tablet Take 1 tablet (10 mg total) by mouth every 6 (six) hours as needed. (Patient not taking: Reported on 06/01/2015) 20 tablet 0  . mometasone-formoterol (DULERA) 100-5 MCG/ACT AERO Inhale 2 puffs into the lungs 2 (two) times daily. (Patient not taking: Reported on 06/01/2015) 1 Inhaler 6  . spironolactone (ALDACTONE) 25 MG tablet Take 1 tablet (25 mg total) by mouth 2 (two) times daily. (Patient not taking: Reported on 06/01/2015) 60 tablet 0   No facility-administered medications prior to visit.    ROS Review of Systems  Constitutional: Negative for activity change and appetite change.  HENT: Negative for sinus pressure and sore throat.   Eyes: Negative for visual disturbance.  Respiratory: Negative for cough, chest tightness and shortness of breath.   Cardiovascular: Negative for chest pain and leg swelling.  Gastrointestinal: Negative for abdominal pain, diarrhea, constipation and abdominal distention.  Endocrine: Negative.   Genitourinary: Negative for dysuria.  Musculoskeletal: Positive for  back pain and neck pain. Negative for myalgias and joint swelling.  Skin: Negative for rash.  Allergic/Immunologic: Negative.   Neurological: Negative for weakness, light-headedness and numbness.  Psychiatric/Behavioral: Negative for suicidal ideas and dysphoric mood.    Objective:  BP 134/81 mmHg  Pulse 105  Temp(Src) 97.9 F  (36.6 C)  Resp 15  Ht 5\' 11"  (1.803 m)  Wt 145 lb 6.4 oz (65.953 kg)  BMI 20.29 kg/m2  SpO2 90%  BP/Weight 06/01/2015 05/15/2015 04/22/2015  Systolic BP 134 113 118  Diastolic BP 81 68 62  Wt. (Lbs) 145.4 140.4 189  BMI 20.29 19.59 26.37      Physical Exam  Constitutional: He is oriented to person, place, and time. He appears well-developed and well-nourished.  In pain  HENT:  On 2 L of oxygen  Cardiovascular: Normal heart sounds and intact distal pulses.  Tachycardia present.   No murmur heard. Pulmonary/Chest: Effort normal and breath sounds normal. He has no wheezes. He has no rales. He exhibits no tenderness.  Left-sided chest wall dressing in place at the site of the pleurX  Abdominal: Soft. Bowel sounds are normal. He exhibits no distension and no mass. There is no tenderness.  Musculoskeletal:  Reduced range of motion on lying supine and tenderness on palpation of cervical and lumbar spine  Neurological: He is alert and oriented to person, place, and time.     CMP Latest Ref Rng 05/15/2015 05/14/2015 05/13/2015  Glucose 65 - 99 mg/dL 89 098(J108(H) 19(J60(L)  BUN 6 - 20 mg/dL 18 47(W24(H) 29(F22(H)  Creatinine 0.61 - 1.24 mg/dL 6.210.89 3.081.02 6.571.04  Sodium 135 - 145 mmol/L 139 137 140  Potassium 3.5 - 5.1 mmol/L 4.4 4.2 4.7  Chloride 101 - 111 mmol/L 101 99(L) 100(L)  CO2 22 - 32 mmol/L 30 34(H) 33(H)  Calcium 8.9 - 10.3 mg/dL 7.9(L) 7.6(L) 8.1(L)  Total Protein 6.5 - 8.1 g/dL 4.9(L) - 5.3(L)  Total Bilirubin 0.3 - 1.2 mg/dL 0.6 - 0.9  Alkaline Phos 38 - 126 U/L 137(H) - 130(H)  AST 15 - 41 U/L 42(H) - 50(H)  ALT 17 - 63 U/L 22 - 22     CLINICAL DATA: Indeterminate liver lesions. Cirrhosis. Inpatient  EXAM: MRI ABDOMEN WITHOUT CONTRAST  TECHNIQUE: Multiplanar multisequence MR imaging was performed without the administration of intravenous contrast.  COMPARISON: 05/11/2015 abdominal sonogram and 03/07/2015 CT abdomen/ pelvis.  FINDINGS: Motion degraded  examination.  Lower chest: Small left greater than right bilateral pleural effusions.  Hepatobiliary: The liver surface is diffusely irregular in contour and there is relative hypertrophy of the left liver lobe, consistent with cirrhosis. Mild diffuse hepatic steatosis. There is a 1.3 x 1.3 cm liver mass at the right liver dome and segment 8 (series 3/image 28), which demonstrates T1 hyperintensity and T2 hypointensity. There is a geographic region of T2 hyperintensity throughout segment 8 of the right liver lobe, suggestive of confluent hepatic fibrosis. There is a 0.5 cm T2 hyperintense liver lesion in segment 4A of the left liver lobe (series 13/ image 17). No additional liver lesions. Mildly distended gallbladder with mild diffuse gallbladder wall thickening and solitary layering 6 mm gallstone. No significant pericholecystic fluid. Mild diffuse central intrahepatic biliary ductal dilatation. Prominently dilated common bile duct, 16 mm diameter, unchanged since 03/07/2015. There is sharp tapering of the lower third of the common bile duct, unchanged. No convincing filling defects in the biliary tree. No gross evidence of a biliary or ampullary mass.  Pancreas: No pancreatic mass  or duct dilation. No pancreas divisum.  Spleen: Mild splenomegaly (craniocaudal splenic length 13.0 cm), decreased from 15.0 cm on 03/07/2015. No splenic mass.  Adrenals/Urinary Tract: Normal right adrenal. There is a 1.5 cm left adrenal nodule (series 13/ image 23), unchanged in size since 03/07/2015, which does not demonstrate signal loss on out of phase chemical shift imaging and remains indeterminate. No hydronephrosis. Stable 1.4 cm parenchymal calcification in the posterior upper left kidney. There are a few subcentimeter T2 hyperintense lesions throughout both kidneys, too small to characterize.  Stomach/Bowel: Grossly normal stomach. Visualized small and large bowel is normal caliber,  with no bowel wall thickening.  Vascular/Lymphatic: Normal caliber abdominal aorta. Normal flow voids in the portal, splenic and renal veins. No pathologically enlarged lymph nodes in the abdomen.  Other: Small volume perihepatic ascites. Trace perisplenic ascites.  Musculoskeletal: No aggressive appearing focal osseous lesions. Stable L4 vertebral compression fracture. Moderate L3 vertebral compression fracture appears new since 03/07/2015. Stable severe T9 vertebral compression fracture.  IMPRESSION: 1. Limited motion degraded inpatient MRI study. 2. Cirrhosis with confluent hepatic fibrosis in the right liver lobe. 3. Segment 8 right liver lobe 1.3 cm mass, incompletely characterized and indeterminate, with MRI features suggestive of a dysplastic nodule, however hepatocellular carcinoma cannot be excluded on the basis of this scan. Correlate with serum AFP level. A follow-up MRI abdomen without and with intravenous contrast is recommended in 3 months. 4. Small volume perihepatic and trace perisplenic ascites. Mild splenomegaly, decreased since 03/07/2015. 5. Indeterminate 1.5 cm left adrenal nodule, unchanged in size since 03/07/2015, recommend attention on follow-up imaging. 6. Moderate L3 vertebral compression fracture appears new since 03/07/2015. Additional T9 and L4 vertebral compression fractures appear stable. 7. Distended gallbladder with solitary subcentimeter gallstone. No significant pericholecystic fluid. These MRI findings are indeterminate for the presence of acute cholecystitis, however a 05/11/2015 abdominal sonogram demonstrated no convincing findings for acute cholecystitis. If there is clinical concern for acute cholecystitis, consider further evaluation with hepatobiliary scintigraphy. 8. Prominently dilated common bile duct (16 mm diameter), unchanged since 03/07/2015. Sharp tapering of the lower third of the common bile duct, with no appreciable  choledocholithiasis or biliary/ampullary mass. Ampullary stenosis cannot be excluded. Correlate with serum bilirubin levels. 9. Small left greater than right bilateral pleural effusions.   Electronically Signed  By: Delbert Phenix M.D.  On: 05/15/2015 11:34  Assessment & Plan:   1. Alcoholic cirrhosis of liver with ascites (HCC) Pedal edema persisting due to the fact that he has been out of his spironolactone which I have refilled Follow-up MRI of the abdomen in 3 months to further evaluate 1.3 cm right liver mass - spironolactone (ALDACTONE) 25 MG tablet; Take 1 tablet (25 mg total) by mouth 2 (two) times daily.  Dispense: 60 tablet; Refill: 3 - Ambulatory referral to Gastroenterology  2. Traumatic hemopneumothorax, sequela Currently with a PleurX which he drains daily Scheduled to see cardiothoracic surgery in 2 days  3. Essential hypertension Controlled  4. Methadone maintenance therapy patient [facility]) Managed by the methadone clinic  5. COPD exacerbation (HCC) Currently on 2 L oxygen at rest - mometasone-formoterol (DULERA) 100-5 MCG/ACT AERO; Inhale 2 puffs into the lungs 2 (two) times daily.  Dispense: 1 Inhaler; Refill: 3 - tiotropium (SPIRIVA) 18 MCG inhalation capsule; Place 1 capsule (18 mcg total) into inhaler and inhale daily.  Dispense: 30 capsule; Refill: 3  6. Gastroesophageal reflux disease without esophagitis - omeprazole (PRILOSEC) 40 MG capsule; Take 1 capsule (40 mg total) by mouth daily.  Dispense: 30 capsule; Refill: 3  7. Chronic pain syndrome For neck and back pain - ketorolac (TORADOL) 10 MG tablet; Take 1 tablet (10 mg total) by mouth every 6 (six) hours as needed.  Dispense: 30 tablet; Refill: 1   Meds ordered this encounter  Medications  . mometasone-formoterol (DULERA) 100-5 MCG/ACT AERO    Sig: Inhale 2 puffs into the lungs 2 (two) times daily.    Dispense:  1 Inhaler    Refill:  3  . spironolactone (ALDACTONE) 25 MG tablet    Sig: Take 1  tablet (25 mg total) by mouth 2 (two) times daily.    Dispense:  60 tablet    Refill:  3  . tiotropium (SPIRIVA) 18 MCG inhalation capsule    Sig: Place 1 capsule (18 mcg total) into inhaler and inhale daily.    Dispense:  30 capsule    Refill:  3  . ketorolac (TORADOL) 10 MG tablet    Sig: Take 1 tablet (10 mg total) by mouth every 6 (six) hours as needed.    Dispense:  30 tablet    Refill:  1  . omeprazole (PRILOSEC) 40 MG capsule    Sig: Take 1 capsule (40 mg total) by mouth daily.    Dispense:  30 capsule    Refill:  3    Follow-up: No Follow-up on file.   Jaclyn Shaggy MD

## 2015-06-01 NOTE — Telephone Encounter (Signed)
Met with the patient when he was in the clinic today and reminded him that he can contact medicaid to scheduled transportation to his clinic appointments. He stated that he understood and he needs to give 3 days notice to schedule his ride with medicaid. He noted that he takes medicaid transportation to his methadone clinic appointments every day. He then stated that he will soon be decreasing the frequency of his visits to the methadone clinic to 5 days/week ( Monday-Friday) and will have his " take home" methadone for the weekend.

## 2015-06-02 ENCOUNTER — Other Ambulatory Visit: Payer: Self-pay | Admitting: Cardiothoracic Surgery

## 2015-06-02 DIAGNOSIS — J90 Pleural effusion, not elsewhere classified: Secondary | ICD-10-CM

## 2015-06-03 ENCOUNTER — Ambulatory Visit: Payer: Self-pay | Admitting: Cardiothoracic Surgery

## 2015-06-04 ENCOUNTER — Telehealth: Payer: Self-pay

## 2015-06-04 NOTE — Telephone Encounter (Signed)
Call placed to patient, and patient informed that per Dr. Venetia NightAmao if he is to receive Tylenol #3 or Tramadol a letter is needed from his physician at Methadone clinic indicating patient does not have a prior opioid addiction and physician's opinion about prescribing for patient. Patient verbalized understanding. Patient also reminded of his next appointment with Dr. Venetia NightAmao on 06/15/15 at 1030. Patient verbalized understanding. No additional needs identified.

## 2015-06-04 NOTE — Telephone Encounter (Signed)
This Case Manager placed call to patient to check on status and to determine if patient picked up medications Dr. Venetia NightAmao prescribed on 06/01/15. Patient's spouse answered phone and indicated patient unavailable at this time. HIPPA compliant message left asking patient to return call to this Case Manager when able. Patient spouse verbalized understanding and indicated she went to pharmacy today to pick up meds but was only given three of five medications. She was uncertain which medications were picked up and indicated she would have patient call back at a later time to discuss. Awaiting return call from patient.

## 2015-06-04 NOTE — Telephone Encounter (Signed)
This Case Manager received return call from patient. Inquired about patient's status. Patient indicated he was in significant pain. Patient complained of pain in his back, ribs, neck of 10/10. Patient indicated he picked up Toradol, but medication was not helping his pain.  Will route telephone encounter to Dr. Venetia NightAmao for consideration.  In addition, this Case Manager asked patient if he picked up medications Dr. Venetia NightAmao prescribed on 06/01/15. Medications thoroughly reviewed with patient and his spouse. Patient and patient's spouse indicated all medications were picked up except spironolactone (25 mg twice daily). This Case Manager placed call to [facility] on [address] to ensure pharmacy received script for spironolactone. Spoke with Rennis HardingEllis who confirmed [facility] received script; however, patient picked up spironolactone on 05/19/15 so it was too early for refill. This Case Manager relayed information to patient and patient's spouse who verbalized understanding and were appreciative of call.

## 2015-06-04 NOTE — Telephone Encounter (Signed)
He is currently receiving methadone from the methadone clinic. If he is to obtain Tylenol 3 or tramadol I will need a letter from his physician at the methadone clinic indicating the patient does not have a prior opioid addiction and his opinion regarding the patient receiving such from us.

## 2015-06-07 ENCOUNTER — Telehealth: Payer: Self-pay

## 2015-06-07 NOTE — Telephone Encounter (Signed)
LMOM to call back to Dr Dennie MaizesGerhardt's office to discuss PleurX site.

## 2015-06-10 ENCOUNTER — Ambulatory Visit
Admission: RE | Admit: 2015-06-10 | Discharge: 2015-06-10 | Disposition: A | Discharge status: Home / Self Care | Payer: Medicaid Other | Source: Ambulatory Visit | Attending: Cardiothoracic Surgery | Admitting: Cardiothoracic Surgery

## 2015-06-10 ENCOUNTER — Encounter: Payer: Self-pay | Admitting: Cardiothoracic Surgery

## 2015-06-10 ENCOUNTER — Ambulatory Visit (INDEPENDENT_AMBULATORY_CARE_PROVIDER_SITE_OTHER): Payer: Self-pay | Admitting: Cardiothoracic Surgery

## 2015-06-10 VITALS — BP 136/88 | HR 134 | Resp 16 | Ht 71.0 in | Wt 145.0 lb

## 2015-06-10 DIAGNOSIS — J90 Pleural effusion, not elsewhere classified: Secondary | ICD-10-CM

## 2015-06-10 DIAGNOSIS — J948 Other specified pleural conditions: Secondary | ICD-10-CM

## 2015-06-10 DIAGNOSIS — Z09 Encounter for follow-up examination after completed treatment for conditions other than malignant neoplasm: Secondary | ICD-10-CM

## 2015-06-10 NOTE — Progress Notes (Signed)
[address]       [place] [id number]             [phone]      [facility] MullJames Bell [facility] Medical Record #[id number] Date of Birth: 05-Feb-1956  Referring: Julio SicksParrett, Tammy S, NP Primary Care: Jaclyn ShaggyEnobong, Amao, MD  Chief Complaint:   POST OP FOLLOW UP 05/12/2015 OPERATIVE REPORT PREOPERATIVE DIAGNOSIS: Recurrent left pleural effusion. POSTOPERATIVE DIAGNOSIS: Recurrent left pleural effusion. SURGICAL PROCEDURE: Bronchoscopy, left video-assisted thoracoscopy, pleural biopsy, drainage of pleural effusion, mechanical pleurodesis, and placement of PleurX catheter. SURGEON: Sheliah PlaneEdward Violet Cart, MD  History of Present Illness:     Patient returns office today for follow-up after drainage of a recurrent left pleural effusion mechanical pleurodesis and placement of a Pleurx catheter, his Pleurx catheter drainage has decreased to less than 50 every other day.. Overall he says he feels better.     Past Medical History  Diagnosis Date  . Chronic back pain   . Hypertension   . COPD (chronic obstructive pulmonary disease) (HCC)   . ARF (acute renal failure) (HCC)     secondary to PNA  . AAA (abdominal aortic aneurysm) (HCC)      History  Smoking status  . Current Every Day Smoker -- 0.25 packs/day for 43 years  . Types: Cigarettes  Smokeless tobacco  . Never Used    History  Alcohol Use No     No Known Allergies  Current Outpatient Prescriptions  Medication Sig Dispense Refill  . albuterol (PROVENTIL HFA;VENTOLIN HFA) 108 (90 Base) MCG/ACT inhaler Inhale 1-2 puffs into the lungs every 4 (four) hours as needed for wheezing or shortness of breath. Reported on 06/01/2015    . furosemide (LASIX) 40 MG tablet Take 1 tablet (40 mg total) by mouth 2 (two) times daily. 60 tablet 0  . ketorolac (TORADOL) 10 MG tablet Take 1 tablet (10 mg total) by mouth every 6 (six) hours as needed. 30 tablet 1  . methadone (DOLOPHINE) 10 MG/5ML solution Take 35.5 mLs (71 mg  total) by mouth daily. (Patient taking differently: Take 54 mg by mouth daily. TAKING 54 DAILY) 120 mL 0  . mometasone-formoterol (DULERA) 100-5 MCG/ACT AERO Inhale 2 puffs into the lungs 2 (two) times daily. 1 Inhaler 3  . Multiple Vitamins-Minerals (MENS MULTI VITAMIN & MINERAL PO) Take 1 tablet by mouth daily. Reported on 06/01/2015    . omeprazole (PRILOSEC) 40 MG capsule Take 1 capsule (40 mg total) by mouth daily. 30 capsule 3  . potassium chloride (KLOR-CON) 20 MEQ packet Take 20 mEq by mouth daily.    Marland Kitchen. spironolactone (ALDACTONE) 25 MG tablet Take 1 tablet (25 mg total) by mouth 2 (two) times daily. 60 tablet 3  . tiotropium (SPIRIVA) 18 MCG inhalation capsule Place 1 capsule (18 mcg total) into inhaler and inhale daily. 30 capsule 3   No current facility-administered medications for this visit.       Physical Exam: BP 136/88 mmHg  Pulse 134  Resp 16  Ht 5\' 11"  (1.803 m)  Wt 145 lb (65.772 kg)  BMI 20.23 kg/m2  SpO2 97%  General appearance: alert, cooperative and appears older than stated age Neurologic: intact Heart: regular rate and rhythm, S1, S2 normal, no murmur, click, rub or gallop Lungs: diminished breath sounds bibasilar Abdomen: Patient has distended abdomen with probable ascites and hepatomegaly, Extremities: extremities normal, atraumatic, no cyanosis or edema Wound: Patient's chest tube sites are somewhat inflamed secondary to sutures, the Pleurx  cuff is now out of the body   Diagnostic Studies & Laboratory data:     Recent Radiology Findings:   Dg Chest 2 View  06/10/2015  CLINICAL DATA:  Status post small caliber chest tube insertion on March 15th for post traumatic hemopneumothorax on the left. History of COPD. EXAM: CHEST  2 VIEW COMPARISON:  Chest x-ray of March 18th 2017. FINDINGS: The right lung is well-expanded. There is no focal infiltrate. On the left there is persist persistent pleural thickening or loculated pleural fluid laterally. At the adjacent  lung has cleared considerably. No significant residual infiltrate or atelectasis is observed. The heart is normal in size. The pulmonary vascularity is not engorged. The small caliber chest tube tip projects just in type inside the thoracic cavity between the posterior eighth and ninth ribs. Multiple healing rib fractures on the left are observed. IMPRESSION: COPD. No pneumothorax. Stable small loculated left pleural effusion. Improved aeration of the left mid and lower lung. Electronically Signed   By: David  [place] M.D.   On: 06/10/2015 13:33      Recent Lab Findings: Lab Results  Component Value Date   WBC 7.2 05/15/2015   HGB 11.1* 05/15/2015   HCT 35.4* 05/15/2015   PLT 140* 05/15/2015   GLUCOSE 89 05/15/2015   TRIG 93 03/27/2015   ALT 22 05/15/2015   AST 42* 05/15/2015   NA 139 05/15/2015   K 4.4 05/15/2015   CL 101 05/15/2015   CREATININE 0.89 05/15/2015   BUN 18 05/15/2015   CO2 30 05/15/2015   TSH 3.590 03/19/2015   INR 1.55* 05/13/2015      Assessment / Plan:     Decreasing amount of Pleurx drainage, draining today and very minimal amount return. The Pleurx was removed in the office today .Without difficulty Plan see the patient back in 7 days with a follow-up chest x-ray.    Delight Ovens MD      [address] [place] [place] [address] Office [phone]   Beeper [phone]  06/10/2015 2:07 PM

## 2015-06-14 ENCOUNTER — Ambulatory Visit: Payer: Medicaid Other | Admitting: Pulmonary Disease

## 2015-06-14 ENCOUNTER — Telehealth: Payer: Self-pay

## 2015-06-14 NOTE — Telephone Encounter (Signed)
Attempted to contact the patient to check on his status and to remind [facility], [facility] @ 1030. Spoke to his wife who stated that he is not home but she will have him return the call.

## 2015-06-15 ENCOUNTER — Ambulatory Visit: Payer: Medicaid Other | Admitting: Family Medicine

## 2015-06-16 ENCOUNTER — Telehealth: Payer: Self-pay

## 2015-06-16 NOTE — Telephone Encounter (Signed)
Attempted to contact the patient to check on his status. Call placed to # [phone] (M) and the phone rang and then the call just ended. No option for leaving a voicemail message. Call then placed to #  [phone] (H) and a HIPAA compliant voice mail message was left requesting a call back to # [phone] or [phone].

## 2015-06-17 ENCOUNTER — Encounter: Payer: Medicaid Other | Admitting: Cardiothoracic Surgery

## 2015-06-18 ENCOUNTER — Other Ambulatory Visit: Payer: Self-pay | Admitting: Cardiothoracic Surgery

## 2015-06-18 DIAGNOSIS — S272XXD Traumatic hemopneumothorax, subsequent encounter: Secondary | ICD-10-CM

## 2015-06-21 ENCOUNTER — Ambulatory Visit (INDEPENDENT_AMBULATORY_CARE_PROVIDER_SITE_OTHER): Payer: Self-pay | Admitting: Surgical

## 2015-06-21 ENCOUNTER — Ambulatory Visit
Admission: RE | Admit: 2015-06-21 | Discharge: 2015-06-21 | Disposition: A | Discharge status: Home / Self Care | Payer: Medicaid Other | Source: Ambulatory Visit | Attending: Cardiothoracic Surgery | Admitting: Cardiothoracic Surgery

## 2015-06-21 VITALS — BP 130/80 | HR 118 | Resp 20 | Ht 71.0 in | Wt 145.0 lb

## 2015-06-21 DIAGNOSIS — J90 Pleural effusion, not elsewhere classified: Secondary | ICD-10-CM

## 2015-06-21 DIAGNOSIS — J948 Other specified pleural conditions: Secondary | ICD-10-CM

## 2015-06-21 DIAGNOSIS — S272XXD Traumatic hemopneumothorax, subsequent encounter: Secondary | ICD-10-CM

## 2015-06-21 NOTE — Progress Notes (Signed)
[address]       [place] [address]             [phone]                  Larico Dimock [facility] Medical Record #[id number] Date of Birth: Oct 22, 1955  Referring OZ:[id number], Virgel Bouquet, NP Primary Cardiology: Primary Care:Enobong, Venetia Night, MD  Chief Complaint:  POST OP  FOLLOW UP  05/12/2015 OPERATIVE REPORT PREOPERATIVE DIAGNOSIS: Recurrent left pleural effusion. POSTOPERATIVE DIAGNOSIS: Recurrent left pleural effusion. SURGICAL PROCEDURE: Bronchoscopy, left video-assisted thoracoscopy, pleural biopsy, drainage of pleural effusion, mechanical pleurodesis, and placement of PleurX catheter. SURGEON: Sheliah Plane, MD  History of Present Illness:    The patient returns to the office today in follow-up. He was last seen in the office on 06/10/2015 by Dr. Tyrone Sage. He is seen on today's date to reevaluate his chest x-ray and clinical condition. His primary complaint is pain in the anterior upper abdominal region. The pain appears to wax and wane and varying degree of severity. He continues to attend the methadone clinic on a daily basis. He doesn't feel as though the Toradol has made a significant difference. He is on chronic oxygen and his comfort level with breathing is fairly stable. Overall, he does feel as though he is improved since prior to the surgery.         Zubrod Score: At the time of surgery this patient's most appropriate activity status/level should be described as:     0    Normal activity, no symptoms     1    Restricted in physical strenuous activity but ambulatory, able to do out light work     2    Ambulatory and capable of self care, unable to do work activities, up and about                 >50 % of waking hours                                                                                       3    Only limited self care, in bed greater than 50% of waking hours     4    Completely disabled, no self care, confined to  bed or chair     5    Moribund  History  Smoking status  . Current Every Day Smoker -- 0.25 packs/day for 43 years  . Types: Cigarettes  Smokeless tobacco  . Never Used       No Known Allergies  Current Outpatient Prescriptions  Medication Sig Dispense Refill  . albuterol (PROVENTIL HFA;VENTOLIN HFA) 108 (90 Base) MCG/ACT inhaler Inhale 1-2 puffs into the lungs every 4 (four) hours as needed for wheezing or shortness of breath. Reported on 06/01/2015    . furosemide (LASIX) 40 MG tablet Take 1 tablet (40 mg total) by mouth 2 (two) times daily. 60 tablet 0  . ketorolac (TORADOL) 10 MG tablet Take 1 tablet (10 mg total) by mouth every 6 (six) hours as needed. 30 tablet 1  . methadone (DOLOPHINE) 10 MG/5ML solution Take  35.5 mLs (71 mg total) by mouth daily. (Patient taking differently: Take 54 mg by mouth daily. TAKING 54 DAILY) 120 mL 0  . mometasone-formoterol (DULERA) 100-5 MCG/ACT AERO Inhale 2 puffs into the lungs 2 (two) times daily. 1 Inhaler 3  . Multiple Vitamins-Minerals (MENS MULTI VITAMIN & MINERAL PO) Take 1 tablet by mouth daily. Reported on 06/01/2015    . omeprazole (PRILOSEC) 40 MG capsule Take 1 capsule (40 mg total) by mouth daily. 30 capsule 3  . potassium chloride (KLOR-CON) 20 MEQ packet Take 20 mEq by mouth daily.    Marland Kitchen. spironolactone (ALDACTONE) 25 MG tablet Take 1 tablet (25 mg total) by mouth 2 (two) times daily. 60 tablet 3  . tiotropium (SPIRIVA) 18 MCG inhalation capsule Place 1 capsule (18 mcg total) into inhaler and inhale daily. 30 capsule 3   No current facility-administered medications for this visit.       Physical Exam: BP 130/80 mmHg  Pulse 118  Resp 20  Ht 5\' 11"  (1.803 m)  Wt 145 lb (65.772 kg)  BMI 20.23 kg/m2  SpO2 91%  General appearance: alert, cooperative and no distress Heart: regular rate and rhythm and Tachycardic Lungs: Mildly diminished breath sounds in the bases. Wound: Incisions well-healed Wounds:  Diagnostic Studies &  Laboratory data:         Recent Radiology Findings: Dg Chest 2 View  06/21/2015  CLINICAL DATA:  Traumatic hemo pneumothorax. Chest tube removal. Shortness of breath. EXAM: CHEST  2 VIEW COMPARISON:  06/10/2015 FINDINGS: Interval left chest tube removal. No recurrent pneumothorax demonstrated. There is stable loculated pleural fluid peripherally in the left hemithorax and patchy bibasilar atelectasis. The heart size and mediastinal contours are stable. Multiple healing left-sided rib fractures again noted. IMPRESSION: Interval left chest tube removal without recurrent pneumothorax or change in residual loculated left pleural effusion. Electronically Signed   By: Carey BullocksWilliam  Veazey M.D.   On: 06/21/2015 12:27      I have independently reviewed the above radiology findings and reviewed findings  with the patient.  Recent Labs: Lab Results  Component Value Date   WBC 7.2 05/15/2015   HGB 11.1* 05/15/2015   HCT 35.4* 05/15/2015   PLT 140* 05/15/2015   GLUCOSE 89 05/15/2015   TRIG 93 03/27/2015   ALT 22 05/15/2015   AST 42* 05/15/2015   NA 139 05/15/2015   K 4.4 05/15/2015   CL 101 05/15/2015   CREATININE 0.89 05/15/2015   BUN 18 05/15/2015   CO2 30 05/15/2015   TSH 3.590 03/19/2015   INR 1.55* 05/13/2015      Assessment / Plan:    The patient appears to be clinically stable as does his chest x-ray. Pain control will continue to be an issue but he appears to be in no distress and is probably at or about his baseline. This is obviously difficult to assess in a short office visit setting. He appears comfortable to continue with the plan of treatment through the pain clinic. We will see again in 1 month with a repeat chest x-ray and prior should he have some increased symptoms.      GOLD,WAYNE E 06/21/2015 1:01 PM

## 2015-06-21 NOTE — Patient Instructions (Signed)
Follow-up in 1 month with repeat chest x-ray. Continue pain clinic as previously

## 2015-06-23 ENCOUNTER — Telehealth: Payer: Self-pay

## 2015-06-23 NOTE — Telephone Encounter (Signed)
This Case Manager placed call to patient to remind him of upcoming appointment on 06/24/15 at 0915 with Dr. Venetia NightAmao. Patient confirmed he had transportation to his appointment. Reminded patient to bring all medications to his appointment for Dr. Venetia NightAmao to review. Patient verbalized understanding. No concerns identified.

## 2015-06-24 ENCOUNTER — Ambulatory Visit: Payer: Medicaid Other | Attending: Family Medicine | Admitting: Family Medicine

## 2015-06-24 ENCOUNTER — Encounter: Payer: Self-pay | Admitting: Family Medicine

## 2015-06-24 ENCOUNTER — Encounter: Payer: Self-pay | Admitting: Clinical

## 2015-06-24 VITALS — BP 120/85 | HR 92 | Temp 98.0°F | Resp 16 | Ht 71.0 in | Wt 151.0 lb

## 2015-06-24 DIAGNOSIS — I10 Essential (primary) hypertension: Secondary | ICD-10-CM | POA: Insufficient documentation

## 2015-06-24 DIAGNOSIS — G894 Chronic pain syndrome: Secondary | ICD-10-CM

## 2015-06-24 DIAGNOSIS — Z79899 Other long term (current) drug therapy: Secondary | ICD-10-CM | POA: Diagnosis not present

## 2015-06-24 DIAGNOSIS — K219 Gastro-esophageal reflux disease without esophagitis: Secondary | ICD-10-CM | POA: Insufficient documentation

## 2015-06-24 DIAGNOSIS — J441 Chronic obstructive pulmonary disease with (acute) exacerbation: Secondary | ICD-10-CM | POA: Insufficient documentation

## 2015-06-24 DIAGNOSIS — S2249XS Multiple fractures of ribs, unspecified side, sequela: Secondary | ICD-10-CM

## 2015-06-24 DIAGNOSIS — K7031 Alcoholic cirrhosis of liver with ascites: Secondary | ICD-10-CM | POA: Diagnosis not present

## 2015-06-24 DIAGNOSIS — R040 Epistaxis: Secondary | ICD-10-CM | POA: Diagnosis not present

## 2015-06-24 DIAGNOSIS — J45901 Unspecified asthma with (acute) exacerbation: Secondary | ICD-10-CM | POA: Diagnosis not present

## 2015-06-24 MED ORDER — FUROSEMIDE 40 MG PO TABS: 40.0000 mg | ORAL_TABLET | Freq: Two times a day (BID) | ORAL | Status: DC

## 2015-06-24 MED ORDER — POTASSIUM CHLORIDE 20 MEQ PO PACK: 20.0000 meq | PACK | Freq: Every day | ORAL | Status: DC

## 2015-06-24 MED ORDER — GABAPENTIN 300 MG PO CAPS: 300.0000 mg | ORAL_CAPSULE | Freq: Three times a day (TID) | ORAL | Status: DC

## 2015-06-24 NOTE — Progress Notes (Signed)
Patient's here for f/up liver cirrhosis. Patient c/o lower back that's constant, pain rated 9.5/10.  Patient c/o nose bleed off and on for awhile now.  Patient requesting pain med for back pain.

## 2015-06-24 NOTE — Progress Notes (Signed)
Subjective:    Patient ID: Benjamin Bell, male    DOB: September 02, 1955, 60 y.o.   MRN: [id number]  HPI He is a 60 year old male with a history of COPD (on 2 L of oxygen), recurrent L pleural effusion, hypertension, GERD, alcoholic cirrhosis of the liver with ascites, recent hospitalization at [facility] with drainage of a recurrent left pleural effusion, mechanical pleurodesis and placement of Pleurx catheter. Pleurx catheter was discontinued on 06/10/15 and he has been to see Cardiothoracic surgery for a second follow-up post discharge. Repeat chest x-ray revealed absence of recurrent pneumothorax or residual loculated left pleural effusion.  Complains of low back pain, neck pain and informs me he broke his back a while ago but never had back surgery. He brings in a letter from Dr.Reddy after methadone clinic indicating it is okay to treat the patient for chronic pain despite his being on methadone and he is requesting something for pain. He does have multiple left-sided rib fractures.  I had referred him to GI at his last visit for management of cirrhosis but his appointment is pending.  Past Medical History  Diagnosis Date  . Chronic back pain   . Hypertension   . COPD (chronic obstructive pulmonary disease) (HCC)   . ARF (acute renal failure) (HCC)     secondary to PNA  . AAA (abdominal aortic aneurysm) [facility])     Past Surgical History  Procedure Laterality Date  . Video assisted thoracoscopy Left 05/12/2015    Procedure: VIDEO ASSISTED THORACOSCOPY;  Surgeon: Delight Ovens, MD;  Location: [facility] OR;  Service: Thoracic;  Laterality: Left;  . Pleural effusion drainage Left 05/12/2015    Procedure: DRAINAGE OF PLEURAL EFFUSION, MECHANICAL PLEURALDESIS, PLEURAL BIOPSIES;  Surgeon: Delight Ovens, MD;  Location: [facility] OR;  Service: Thoracic;  Laterality: Left;  . Video bronchoscopy N/A 05/12/2015    Procedure: VIDEO BRONCHOSCOPY;  Surgeon: Delight Ovens, MD;  Location: [facility] OR;   Service: Thoracic;  Laterality: N/A;  . Chest tube insertion N/A 05/12/2015    Procedure: INSERTION PLEURAX CATH;  Surgeon: Delight Ovens, MD;  Location: [facility] OR;  Service: Thoracic;  Laterality: N/A;    .all  Current Outpatient Prescriptions on File Prior to Visit  Medication Sig Dispense Refill  . albuterol (PROVENTIL HFA;VENTOLIN HFA) 108 (90 Base) MCG/ACT inhaler Inhale 1-2 puffs into the lungs every 4 (four) hours as needed for wheezing or shortness of breath. Reported on 06/01/2015    . ketorolac (TORADOL) 10 MG tablet Take 1 tablet (10 mg total) by mouth every 6 (six) hours as needed. 30 tablet 1  . methadone (DOLOPHINE) 10 MG/5ML solution Take 35.5 mLs (71 mg total) by mouth daily. (Patient taking differently: Take 54 mg by mouth daily. TAKING 54 DAILY) 120 mL 0  . mometasone-formoterol (DULERA) 100-5 MCG/ACT AERO Inhale 2 puffs into the lungs 2 (two) times daily. 1 Inhaler 3  . Multiple Vitamins-Minerals (MENS MULTI VITAMIN & MINERAL PO) Take 1 tablet by mouth daily. Reported on 06/01/2015    . omeprazole (PRILOSEC) 40 MG capsule Take 1 capsule (40 mg total) by mouth daily. 30 capsule 3  . spironolactone (ALDACTONE) 25 MG tablet Take 1 tablet (25 mg total) by mouth 2 (two) times daily. 60 tablet 3  . tiotropium (SPIRIVA) 18 MCG inhalation capsule Place 1 capsule (18 mcg total) into inhaler and inhale daily. 30 capsule 3   No current facility-administered medications on file prior to visit.     Review of Systems  Review of Systems  Constitutional: Negative for activity change and appetite change.  HENT: Negative for sinus pressure and sore throat.   Eyes: Negative for visual disturbance.  Respiratory: Negative for cough, chest tightness and shortness of breath.   Cardiovascular: Negative for chest pain and leg swelling.  Gastrointestinal: Negative for abdominal pain, diarrhea, constipation and abdominal distention.  Endocrine: Negative.   Genitourinary: Negative for dysuria.    Musculoskeletal: Positive for back pain and neck pain. Negative for myalgias and joint swelling.  Skin: Negative for rash.  Allergic/Immunologic: Negative.   Hematologic: Positive for intermittent epistaxis at night. Neurological: Negative for weakness, light-headedness and numbness.  Psychiatric/Behavioral: Negative for suicidal ideas and dysphoric mood.    Objective: Filed Vitals:   06/24/15 0908 06/24/15 0919  BP: 159/78 120/85  Pulse: 92   Temp: 98 F (36.7 C)   TempSrc: Oral   Resp: 16   Height:  (1.803 m)   Weight: 151 lb (68.493 kg)   SpO2: 92%       Physical Exam Constitutional: He is oriented to person, place, and time. He appears well-developed and well-nourished.  In pain  HENT:  On 2 L of oxygen  Cardiovascular: Normal rate, regular rhythm, Normal heart sounds and intact distal pulses.   No murmur heard. Pulmonary/Chest: Effort normal and breath sounds normal. He has no wheezes. He has no rales. He exhibits no tenderness.  Left-sided chest wall dressing in place Abdominal: Soft. Bowel sounds are normal. He exhibits no distension and no mass. There is no tenderness.  Musculoskeletal:  Reduced range of motion on lying supine and tenderness on palpation of cervical and lumbar spine  Neurological: He is alert and oriented to person, place, and time.     CMP Latest Ref Rng 05/15/2015 05/14/2015 05/13/2015  Glucose 65 - 99 mg/dL 89 865(H) 84(O)  BUN 6 - 20 mg/dL 18 96(E) 95(M)  Creatinine 0.61 - 1.24 mg/dL 8.41 3.24 4.01  Sodium 135 - 145 mmol/L 139 137 140  Potassium 3.5 - 5.1 mmol/L 4.4 4.2 4.7  Chloride 101 - 111 mmol/L 101 99(L) 100(L)  CO2 22 - 32 mmol/L 30 34(H) 33(H)  Calcium 8.9 - 10.3 mg/dL 7.9(L) 7.6(L) 8.1(L)  Total Protein 6.5 - 8.1 g/dL 4.9(L) - 5.3(L)  Total Bilirubin 0.3 - 1.2 mg/dL 0.6 - 0.9  Alkaline Phos 38 - 126 U/L 137(H) - 130(H)  AST 15 - 41 U/L 42(H) - 50(H)  ALT 17 - 63 U/L 22 - 22     CBC Latest Ref Rng 05/15/2015 05/14/2015  05/13/2015  WBC 4.0 - 10.5 K/uL 7.2 7.9 10.4  Hemoglobin 13.0 - 17.0 g/dL 11.1(L) 11.0(L) 12.3(L)  Hematocrit 39.0 - 52.0 % 35.4(L) 35.3(L) 40.3  Platelets 150 - 400 K/uL 140(L) 147(L) 177    CLINICAL DATA: Traumatic hemo pneumothorax. Chest tube removal. Shortness of breath.  EXAM: CHEST 2 VIEW  COMPARISON: 06/10/2015  FINDINGS: Interval left chest tube removal. No recurrent pneumothorax demonstrated. There is stable loculated pleural fluid peripherally in the left hemithorax and patchy bibasilar atelectasis. The heart size and mediastinal contours are stable. Multiple healing left-sided rib fractures again noted.  IMPRESSION: Interval left chest tube removal without recurrent pneumothorax or change in residual loculated left pleural effusion.   Electronically Signed  By: Carey Bullocks M.D.  On: 06/21/2015 12:27     Assessment & Plan:  1. Alcoholic cirrhosis of liver with ascites (HCC) Refilled Lasix, continue spironolactone Previously referred to [facility] - I have provided him  with the number to get in touch with the practice as he is yet to hear from them. Follow-up MRI of the abdomen in 3 months to further evaluate 1.3 cm right liver mass  2. Traumatic hemopneumothorax, sequela Status post removal of Pleurx catheter Scheduled to follow-up with CT surgery in one month  3. Essential hypertension Initial blood pressure was elevated but repeat was down to normal  4. Methadone maintenance therapy patient [facility](HCC) Managed by the methadone clinic Patient has a letter indicating it's okay to treat chronic pain as per methadone clinic physician  5. COPD exacerbation (HCC) Currently on 2 L oxygen at rest  6. Gastroesophageal reflux disease without esophagitis - omeprazole (PRILOSEC) 40 MG capsule; Take 1 capsule (40 mg total) by mouth daily.  Dispense: 30 capsule; Refill: 3  7. Multiple rib fractures and Chronic pain syndrome For neck and back  pain Referred to pain management- I have explained the pain policy of the clinic today him and offered him Tylenol No. 3 or tramadol which he refuses -Placed on Neurontin. - ketorolac (TORADOL) 10 MG tablet; Take 1 tablet (10 mg total) by mouth every 6 (six) hours as needed.  Dispense: 30 tablet; Refill: 1  8. Epistaxis This is intermittent and most likely related to dry oxygen through his nasal cannula. I have advised him to get in touch with his DME company so humidified oxygen can be provided. We'll also monitor his INR as he is at risk for bleeding and coagulopathy from liver cirrhosis.

## 2015-06-24 NOTE — Progress Notes (Signed)
Depression screen [facility]Q 2/9 06/24/2015 06/01/2015  Decreased Interest 2 0  Down, Depressed, Hopeless 0 0  PHQ - 2 Score 2 0  Altered sleeping 3 0  Tired, decreased energy 3 2  Change in appetite 3 0  Feeling bad or failure about yourself  0 0  Trouble concentrating 2 0  Moving slowly or fidgety/restless 0 0  Suicidal thoughts 0 0  PHQ-9 Score 13 2    GAD 7 : Generalized Anxiety Score 06/24/2015 06/01/2015  Nervous, Anxious, on Edge 1 2  Control/stop worrying 0 0  Worry too much - different things 3 0  Trouble relaxing 3 3  Restless 3 2  Easily annoyed or irritable 3 0  Afraid - awful might happen 0 0  Total GAD 7 Score 13 7

## 2015-06-24 NOTE — Patient Instructions (Signed)
Cirrhosis °Cirrhosis is long-term (chronic) liver injury. The liver is your largest internal organ, and it performs many functions. The liver converts food into energy, removes toxic material from your blood, makes important proteins, and absorbs necessary vitamins from your diet. °If you have cirrhosis, it means many of your healthy liver cells have been replaced by scar tissue. This prevents blood from flowing through your liver, which makes it difficult for your liver to function. This scarring is not reversible, but treatment can prevent it from getting worse.  °CAUSES  °Hepatitis C and long-term alcohol abuse are the most common causes of cirrhosis. Other causes include: °· Nonalcoholic fatty liver disease. °· Hepatitis B infection. °· Autoimmune hepatitis. °· Diseases that cause blockage of ducts inside the liver. °· Inherited liver diseases. °· Reactions to certain long-term medicines. °· Parasitic infections. °· Long-term exposure to certain toxins. °RISK FACTORS °You may have a higher risk of cirrhosis if you: °· Have certain hepatitis viruses. °· Abuse alcohol, especially if you are male. °· Are overweight. °· Share needles. °· Have unprotected sex with someone who has hepatitis. °SYMPTOMS  °You may not have any signs and symptoms at first. Symptoms may not develop until the damage to your liver starts to get worse. Signs and symptoms of cirrhosis may include:  °· Tenderness in the right-upper part of your abdomen. °· Weakness and tiredness (fatigue). °· Loss of appetite. °· Nausea. °· Weight loss and muscle loss. °· Itchiness. °· Yellow skin and eyes (jaundice). °· Buildup of fluid in the abdomen (ascites). °· Swelling of the feet and ankles (edema). °· Appearance of tiny blood vessels under the skin. °· Mental confusion. °· Easy bruising and bleeding. °DIAGNOSIS  °Your health care provider may suspect cirrhosis based on your symptoms and medical history, especially if you have other medical conditions  or a history of alcohol abuse. Your health care provider will do a physical exam to feel your liver and check for signs of cirrhosis. Your health care provider may perform other tests, including:  °· Blood tests to check:   °¨ Whether you have hepatitis B or C.   °¨ Kidney function. °¨ Liver function. °· Imaging tests such as: °¨ MRI or CT scan to look for changes seen in advanced cirrhosis. °¨ Ultrasound to see if normal liver tissue is being replaced by scar tissue. °· A procedure using a long needle to take a sample of liver tissue (biopsy) for examination under a microscope. Liver biopsy can confirm the diagnosis of cirrhosis.   °TREATMENT  °Treatment depends on how damaged your liver is and what caused the damage. Treatment may include treating cirrhosis symptoms or treating the underlying causes of the condition to try to slow the progression of the damage. Treatment may include: °· Making lifestyle changes, such as:   °¨ Eating a healthy diet. °¨ Restricting salt intake.  °¨ Maintaining a healthy weight.   °¨ Not abusing drugs or alcohol. °· Taking medicines to: °¨ Treat liver infections or other infections. °¨ Control itching. °¨ Reduce fluid buildup. °¨ Reduce certain blood toxins. °¨ Reduce risk of bleeding from enlarged blood vessels in the stomach or esophagus (varices). °· If varices are causing bleeding problems, you may need treatment with a procedure that ties up the vessels causing them to fall off (band ligation). °· If cirrhosis is causing your liver to fail, your health care provider may recommend a liver transplant. °· Other treatments may be recommended depending on any complications of cirrhosis, such as liver-related kidney failure (hepatorenal   syndrome). °HOME CARE INSTRUCTIONS  °· Take medicines only as directed by your health care provider. Do not use drugs that are toxic to your liver. Ask your health care provider before taking any new medicines, including over-the-counter medicines.    °· Rest as needed. °· Eat a well-balanced diet. Ask your health care provider or dietitian for more information.   °· You may have to follow a low-salt diet or restrict your water intake as directed. °· Do not drink alcohol. This is especially important if you are taking acetaminophen. °· Keep all follow-up visits as directed by your health care provider. This is important. °SEEK MEDICAL CARE IF: °· You have fatigue or weakness that is getting worse. °· You develop swelling of the hands, feet, legs, or face. °· You have a fever. °· You develop loss of appetite. °· You have nausea or vomiting. °· You develop jaundice. °· You develop easy bruising or bleeding. °SEEK IMMEDIATE MEDICAL CARE IF: °· You vomit bright red blood or a material that looks like coffee grounds. °· You have blood in your stools. °· Your stools appear black and tarry. °· You become confused. °· You have chest pain or trouble breathing. °  °This information is not intended to replace advice given to you by your health care provider. Make sure you discuss any questions you have with your health care provider. °  °Document Released: 02/13/2005 Document Revised: 03/06/2014 Document Reviewed: 10/22/2013 °Elsevier Interactive Patient Education ©2016 [employer] ° °

## 2015-06-25 LAB — ACID FAST CULTURE WITH REFLEXED SENSITIVITIES: ACID FAST CULTURE - AFSCU3: NEGATIVE

## 2015-07-01 ENCOUNTER — Ambulatory Visit: Payer: Medicaid Other | Admitting: Internal Medicine

## 2015-07-21 ENCOUNTER — Other Ambulatory Visit: Payer: Self-pay | Admitting: Cardiothoracic Surgery

## 2015-07-21 DIAGNOSIS — S272XXD Traumatic hemopneumothorax, subsequent encounter: Principal | ICD-10-CM

## 2015-07-21 DIAGNOSIS — S2190XD Unspecified open wound of unspecified part of thorax, subsequent encounter: Secondary | ICD-10-CM

## 2015-07-22 ENCOUNTER — Encounter: Payer: Self-pay | Admitting: Cardiothoracic Surgery

## 2015-07-27 ENCOUNTER — Other Ambulatory Visit: Payer: Self-pay | Admitting: Family Medicine

## 2015-08-05 ENCOUNTER — Other Ambulatory Visit: Payer: Self-pay | Admitting: Family Medicine

## 2015-08-17 ENCOUNTER — Other Ambulatory Visit: Payer: Self-pay | Admitting: Family Medicine

## 2015-08-22 ENCOUNTER — Emergency Department (HOSPITAL_COMMUNITY): Payer: Medicaid Other

## 2015-08-22 ENCOUNTER — Observation Stay (HOSPITAL_COMMUNITY)
Admission: EM | Admit: 2015-08-22 | Discharge: 2015-08-23 | Disposition: A | Discharge status: Home / Self Care | Payer: Medicaid Other | Attending: Family Medicine | Admitting: Family Medicine

## 2015-08-22 ENCOUNTER — Encounter (HOSPITAL_COMMUNITY): Payer: Self-pay | Admitting: *Deleted

## 2015-08-22 DIAGNOSIS — N179 Acute kidney failure, unspecified: Secondary | ICD-10-CM | POA: Insufficient documentation

## 2015-08-22 DIAGNOSIS — L03119 Cellulitis of unspecified part of limb: Secondary | ICD-10-CM

## 2015-08-22 DIAGNOSIS — I10 Essential (primary) hypertension: Secondary | ICD-10-CM | POA: Diagnosis not present

## 2015-08-22 DIAGNOSIS — L03115 Cellulitis of right lower limb: Secondary | ICD-10-CM | POA: Insufficient documentation

## 2015-08-22 DIAGNOSIS — J90 Pleural effusion, not elsewhere classified: Secondary | ICD-10-CM | POA: Diagnosis not present

## 2015-08-22 DIAGNOSIS — K7031 Alcoholic cirrhosis of liver with ascites: Secondary | ICD-10-CM | POA: Diagnosis not present

## 2015-08-22 DIAGNOSIS — J449 Chronic obstructive pulmonary disease, unspecified: Secondary | ICD-10-CM | POA: Insufficient documentation

## 2015-08-22 DIAGNOSIS — E876 Hypokalemia: Secondary | ICD-10-CM | POA: Insufficient documentation

## 2015-08-22 DIAGNOSIS — L0390 Cellulitis, unspecified: Secondary | ICD-10-CM | POA: Diagnosis present

## 2015-08-22 DIAGNOSIS — F17210 Nicotine dependence, cigarettes, uncomplicated: Secondary | ICD-10-CM | POA: Diagnosis not present

## 2015-08-22 DIAGNOSIS — K219 Gastro-esophageal reflux disease without esophagitis: Secondary | ICD-10-CM | POA: Diagnosis not present

## 2015-08-22 DIAGNOSIS — I700 Atherosclerosis of aorta: Secondary | ICD-10-CM | POA: Insufficient documentation

## 2015-08-22 DIAGNOSIS — Z9981 Dependence on supplemental oxygen: Secondary | ICD-10-CM | POA: Insufficient documentation

## 2015-08-22 DIAGNOSIS — G894 Chronic pain syndrome: Secondary | ICD-10-CM | POA: Insufficient documentation

## 2015-08-22 DIAGNOSIS — E872 Acidosis: Secondary | ICD-10-CM | POA: Diagnosis not present

## 2015-08-22 DIAGNOSIS — L03116 Cellulitis of left lower limb: Secondary | ICD-10-CM | POA: Diagnosis not present

## 2015-08-22 LAB — CBC
HCT: 39.4 % (ref 39.0–52.0)
Hemoglobin: 12.5 g/dL — ABNORMAL LOW (ref 13.0–17.0)
MCH: 28.8 pg (ref 26.0–34.0)
MCHC: 31.7 g/dL (ref 30.0–36.0)
MCV: 90.8 fL (ref 78.0–100.0)
PLATELETS: 198 10*3/uL (ref 150–400)
RBC: 4.34 MIL/uL (ref 4.22–5.81)
RDW: 15.7 % — AB (ref 11.5–15.5)
WBC: 5.5 10*3/uL (ref 4.0–10.5)

## 2015-08-22 LAB — I-STAT CG4 LACTIC ACID, ED
LACTIC ACID, VENOUS: 1.71 mmol/L (ref 0.5–2.0)
Lactic Acid, Venous: 2.75 mmol/L (ref 0.5–2.0)

## 2015-08-22 LAB — BASIC METABOLIC PANEL
Anion gap: 7 (ref 5–15)
BUN: 19 mg/dL (ref 6–20)
CALCIUM: 8.1 mg/dL — AB (ref 8.9–10.3)
CO2: 29 mmol/L (ref 22–32)
CREATININE: 1.48 mg/dL — AB (ref 0.61–1.24)
Chloride: 101 mmol/L (ref 101–111)
GFR, EST AFRICAN AMERICAN: 58 mL/min — AB (ref >60)
GFR, EST NON AFRICAN AMERICAN: 50 mL/min — AB (ref >60)
Glucose, Bld: 165 mg/dL — ABNORMAL HIGH (ref 65–99)
Potassium: 3.5 mmol/L (ref 3.5–5.1)
SODIUM: 137 mmol/L (ref 135–145)

## 2015-08-22 LAB — HEPATIC FUNCTION PANEL
ALBUMIN: 2.1 g/dL — AB (ref 3.5–5.0)
ALT: 24 U/L (ref 17–63)
AST: 64 U/L — ABNORMAL HIGH (ref 15–41)
Alkaline Phosphatase: 293 U/L — ABNORMAL HIGH (ref 38–126)
Bilirubin, Direct: 0.2 mg/dL (ref 0.1–0.5)
Indirect Bilirubin: 0.2 mg/dL — ABNORMAL LOW (ref 0.3–0.9)
TOTAL PROTEIN: 7.9 g/dL (ref 6.5–8.1)
Total Bilirubin: 0.4 mg/dL (ref 0.3–1.2)

## 2015-08-22 LAB — AMMONIA: Ammonia: 48 umol/L — ABNORMAL HIGH (ref 9–35)

## 2015-08-22 LAB — MRSA PCR SCREENING: MRSA BY PCR: POSITIVE — AB

## 2015-08-22 MED ORDER — ENOXAPARIN SODIUM 40 MG/0.4ML ~~LOC~~ SOLN
40.0000 mg | SUBCUTANEOUS | Status: DC
  Administered 2015-08-22: 40 mg via SUBCUTANEOUS
  Filled 2015-08-22: qty 0.4

## 2015-08-22 MED ORDER — SODIUM CHLORIDE 0.9% FLUSH: 3.0000 mL | INTRAVENOUS | Status: DC | PRN

## 2015-08-22 MED ORDER — CETYLPYRIDINIUM CHLORIDE 0.05 % MT LIQD
7.0000 mL | Freq: Two times a day (BID) | OROMUCOSAL | Status: DC
  Administered 2015-08-22 – 2015-08-23 (×2): 7 mL via OROMUCOSAL

## 2015-08-22 MED ORDER — PANTOPRAZOLE SODIUM 40 MG PO TBEC
40.0000 mg | DELAYED_RELEASE_TABLET | Freq: Every day | ORAL | Status: DC
  Administered 2015-08-23: 40 mg via ORAL
  Filled 2015-08-22: qty 1

## 2015-08-22 MED ORDER — SODIUM CHLORIDE 0.9 % IV SOLN: 250.0000 mL | INTRAVENOUS | Status: DC | PRN

## 2015-08-22 MED ORDER — METHADONE HCL 10 MG PO TABS
55.0000 mg | ORAL_TABLET | Freq: Every day | ORAL | Status: DC
  Administered 2015-08-23: 55 mg via ORAL
  Filled 2015-08-22: qty 6

## 2015-08-22 MED ORDER — NICOTINE 7 MG/24HR TD PT24
7.0000 mg | MEDICATED_PATCH | Freq: Every day | TRANSDERMAL | Status: DC
  Administered 2015-08-22 – 2015-08-23 (×2): 7 mg via TRANSDERMAL
  Filled 2015-08-22 (×3): qty 1

## 2015-08-22 MED ORDER — TIOTROPIUM BROMIDE MONOHYDRATE 18 MCG IN CAPS
18.0000 ug | ORAL_CAPSULE | Freq: Every day | RESPIRATORY_TRACT | Status: DC
  Administered 2015-08-23: 18 ug via RESPIRATORY_TRACT
  Filled 2015-08-22: qty 5

## 2015-08-22 MED ORDER — SODIUM CHLORIDE 0.9% FLUSH
3.0000 mL | Freq: Two times a day (BID) | INTRAVENOUS | Status: DC
  Administered 2015-08-22 – 2015-08-23 (×2): 3 mL via INTRAVENOUS

## 2015-08-22 MED ORDER — ACETAMINOPHEN 325 MG PO TABS
650.0000 mg | ORAL_TABLET | Freq: Four times a day (QID) | ORAL | Status: DC | PRN
  Administered 2015-08-22: 650 mg via ORAL
  Filled 2015-08-22: qty 2

## 2015-08-22 MED ORDER — DEXTROSE 5 % IV SOLN
1.0000 g | Freq: Once | INTRAVENOUS | Status: AC
  Administered 2015-08-22: 1 g via INTRAVENOUS
  Filled 2015-08-22: qty 10

## 2015-08-22 MED ORDER — ENSURE ENLIVE PO LIQD: 237.0000 mL | Freq: Two times a day (BID) | ORAL | Status: DC

## 2015-08-22 MED ORDER — MOMETASONE FURO-FORMOTEROL FUM 100-5 MCG/ACT IN AERO
2.0000 | INHALATION_SPRAY | Freq: Two times a day (BID) | RESPIRATORY_TRACT | Status: DC
  Administered 2015-08-23: 2 via RESPIRATORY_TRACT
  Filled 2015-08-22: qty 8.8

## 2015-08-22 MED ORDER — SODIUM CHLORIDE 0.9 % IV BOLUS (SEPSIS)
500.0000 mL | Freq: Once | INTRAVENOUS | Status: AC
  Administered 2015-08-22: 500 mL via INTRAVENOUS

## 2015-08-22 MED ORDER — ACETAMINOPHEN 650 MG RE SUPP: 650.0000 mg | Freq: Four times a day (QID) | RECTAL | Status: DC | PRN

## 2015-08-22 MED ORDER — METHADONE HCL 10 MG/5ML PO SOLN
54.0000 mg | Freq: Every day | ORAL | Status: DC
  Filled 2015-08-22 (×9): qty 27

## 2015-08-22 MED ORDER — GABAPENTIN 300 MG PO CAPS
300.0000 mg | ORAL_CAPSULE | Freq: Three times a day (TID) | ORAL | Status: DC
  Administered 2015-08-22 – 2015-08-23 (×2): 300 mg via ORAL
  Filled 2015-08-22 (×2): qty 1

## 2015-08-22 NOTE — ED Notes (Signed)
Bilateral lower extremity redness x 1 week thought it was getting better and then bumped rt lower leg into bed post  Has a skin tear and it weaping a bit rt lower leg

## 2015-08-22 NOTE — H&P (Signed)
[facility] Admission History and Physical Service Pager: [phone]  Patient name: Benjamin Bell Medical record number: [id number] Date of birth: 04-28-1955 Age: 60 y.o. Gender: male  Primary Care Provider: Arnoldo Morale, MD Consultants: None Code Status: Full  Chief Complaint: leg wound  Assessment and Plan: [name] is a 60 y.o. male presenting with bilateral lower extremity cellulitis. PMH significant for HTN, COPD (2L O2 at baseline), GERD, and alcoholic cirrhosis.   Cellulitis/Leg wound. Cellulitis on bilateral lower extremities with RLE leg wound.  Afebrile and VSS. No leukocytosis.  -Admit for observation under Dr. Erin Hearing -wound care consulted -received CTX in ED which is good for 24hrs; transition to PO Abx tomorrow -monitor for fevers -PT/OT consult -repeat labs in AM  AKI. Baseline Cr .8. On admission Cr 1.48. Likely combination of his nephrotoxic medications (Lasix and spironolactone) along with some poor fluid intake.  -IVF for 12hrs -trend Cr -avoid nephrotoxic medications   Lactic Acidosis. Initially in ED lactic acid elevated at 2.75. After 500cc bolus lactic acid returned to normal levels. Most likely was elevated due to poor hydration.   Cirrhosis. H/o alcoholic cirrhosis with ascites. Also with history of Hep C that was treated. Ammonia elevated on admission to 48. Has chronic abdominal distention without ascites. Alk Phos elevated at 293. -will monitor -repeat CMP in AM -holding home Lasix and spironolactone in setting of AKI  COPD. At baseline. Uses 2-4L O2 at home. CXR with no acute finding. Has a chronic pleural effusion on left.  -continue O2 therapy to keep saturation above 88% -continue home medications dulera, spiriva, and prn albuterol  HTN. Normotensive on admission.   -holding home lasix and spironolactone.   Tobacco use. Current everyday smoker -Nicotine Patch ordered  H/o substance use. Continue methadone.  Avoid narcotic pain medications.   FEN/GI: Heart healthy diet. NS'@100mL'$  for 12 hours. PPI. Prophylaxis: Lovenox  Disposition: Admit for observation to FPTS.  History of Present Illness:  Benjamin Bell is a 61 y.o. male presenting with bilateral lower extremity cellulitis. PMH significant for HTN, COPD (2L O2 at baseline), GERD, and alcoholic cirrhosis.   Patient states that she presented to the ED today because he has a "hole" in his right leg. States that the wound first appeared about two weeks ago when he fell. Started to heal but then about a week ago he bumped his leg on the bed and reopened the wound. States it has not been getting better since then. Denies any pain at site but does endorse itching to area. Denies fevers/chills. Denies drainage from wound. Endorses erythema and swelling in legs.   Walks with cane at baseline but has had falls previously.   Review Of Systems: Per HPI with the following additions: denies chest pain, abdominal pian, dyspnea, n/v.  Otherwise the remainder of the systems were negative.  Patient Active Problem List   Diagnosis Date Noted  . GERD (gastroesophageal reflux disease) 06/01/2015  . Alcohol abuse   . Chronic pain syndrome   . Cirrhosis of liver with ascites ([place])   . Essential hypertension   . Ascites   . 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 Recurrent left pleural effusion 04/13/2015  . Pressure ulcer 03/22/2015  . Hepatic cirrhosis ([place]) 03/15/2015  . Pulmonary nodules 03/15/2015  . Adrenal mass ([place]) 03/15/2015  . AAA (abdominal aortic aneurysm) ([place]) 03/15/2015  . Urinary retention 03/15/2015  . Fall   . Multiple rib fractures 03/07/2015    Past Medical History: Past  Medical History  Diagnosis Date  . Chronic back pain   . Hypertension   . COPD (chronic obstructive pulmonary disease) ([place])   . ARF (acute renal failure) ([facility])     secondary to PNA  . AAA (abdominal aortic aneurysm) ([place])   . Alcoholic  cirrhosis ([place])   . GERD (gastroesophageal reflux disease)     Past Surgical History: Past Surgical History  Procedure Laterality Date  . Video assisted thoracoscopy Left 05/12/2015    Procedure: VIDEO ASSISTED THORACOSCOPY;  Surgeon: Grace Isaac, MD;  Location: [place];  Service: Thoracic;  Laterality: Left;  . Pleural effusion drainage Left 05/12/2015    Procedure: DRAINAGE OF PLEURAL EFFUSION, MECHANICAL PLEURALDESIS, PLEURAL BIOPSIES;  Surgeon: Grace Isaac, MD;  Location: [place];  Service: Thoracic;  Laterality: Left;  . Video bronchoscopy N/A 05/12/2015    Procedure: VIDEO BRONCHOSCOPY;  Surgeon: Grace Isaac, MD;  Location: [place];  Service: Thoracic;  Laterality: N/A;  . Chest tube insertion N/A 05/12/2015    Procedure: INSERTION KGMWNUU CATH;  Surgeon: Grace Isaac, MD;  Location: [facility] OR;  Service: Thoracic;  Laterality: N/A;    Social History: Social History  Substance Use Topics  . Smoking status: Current Every Day Smoker -- 0.25 packs/day for 43 years    Types: Cigarettes  . Smokeless tobacco: Never Used  . Alcohol Use: No   Past alcohol use ~7 years ago Additional social history: Lives with wife Please also refer to relevant sections of EMR.  Family History: Family History  Problem Relation Age of Onset  . Diabetes Mother   Marena Chancy of full family history. Both parents deceased from what he says are natural causes.    Allergies and Medications: No Known Allergies No current facility-administered medications on file prior to encounter.   Current Outpatient Prescriptions on File Prior to Encounter  Medication Sig Dispense Refill  . albuterol (PROVENTIL HFA;VENTOLIN HFA) 108 (90 Base) MCG/ACT inhaler Inhale 1-2 puffs into the lungs every 4 (four) hours as needed for wheezing or shortness of breath. Reported on 06/01/2015    . DULERA 100-5 MCG/ACT AERO INHALE 2 PUFFS INTO THE LUNGS TWICE DAILY 39 g 0  . furosemide (LASIX) 40 MG tablet Take 1 tablet (40 mg  total) by mouth 2 (two) times daily. 60 tablet 2  . gabapentin (NEURONTIN) 300 MG capsule Take 1 capsule (300 mg total) by mouth 3 (three) times daily. 90 capsule 2  . methadone (DOLOPHINE) 10 MG/5ML solution Take 35.5 mLs (71 mg total) by mouth daily. (Patient taking differently: Take 68 mg by mouth daily. TAKING 54 DAILY) 120 mL 0  . Multiple Vitamins-Minerals (MENS MULTI VITAMIN & MINERAL PO) Take 1 tablet by mouth daily. Reported on 06/01/2015    . omeprazole (PRILOSEC) 40 MG capsule Take 1 capsule (40 mg total) by mouth daily. 30 capsule 3  . potassium chloride (KLOR-CON) 20 MEQ packet Take 20 mEq by mouth daily. 30 tablet 2  . spironolactone (ALDACTONE) 25 MG tablet Take 1 tablet (25 mg total) by mouth 2 (two) times daily. 60 tablet 3  . tiotropium (SPIRIVA) 18 MCG inhalation capsule Place 1 capsule (18 mcg total) into inhaler and inhale daily. 30 capsule 3    Objective: BP 156/85 mmHg  Pulse 118  Temp(Src) 97.3 F (36.3 C) (Oral)  Resp 18  SpO2 97% Exam: General: alert, well-developed, NAD, cooperative, older white male lying in bed HEENT: NCAT, PERRL. EOMI. MMM.  Neck: supple, full ROM Lungs: CTAB, normal  respiratory effort, no crackles, and no wheezes.  Heart: RRR, no M/R/G.  Abdomen: Bowel sounds normal; abdomen soft and nontender. Surgical scars appreciated. Mildly distended.  Pulses: Diminished DP/PT.  Extremities: Bilateral lower extremities with warmth, swelling, and erythema consistent with cellulitis. RLE with 1in wound on lateral shin. No purulent drainage just old blood. RUE with superficial abrasion. Normal range of motion. Neurologic: No focal deficits, +5 strength globally, sensation abnormal in bilateral feet, A&Ox3 but delayed responses.  Skin: Skin tears and bruising appreciated on extremities. Thin skin. Multiple eschars. Psych: Mood and affect are normal; no evidence of anxiety or depression.   Labs and Imaging: Results for orders placed or performed during the  hospital encounter of 08/22/15 (from the past 24 hour(s))  CBC     Status: Abnormal   Collection Time: 08/22/15 12:00 PM  Result Value Ref Range   WBC 5.5 4.0 - 10.5 K/uL   RBC 4.34 4.22 - 5.81 MIL/uL   Hemoglobin 12.5 (L) 13.0 - 17.0 g/dL   HCT 39.4 39.0 - 52.0 %   MCV 90.8 78.0 - 100.0 fL   MCH 28.8 26.0 - 34.0 pg   MCHC 31.7 30.0 - 36.0 g/dL   RDW 15.7 (H) 11.5 - 15.5 %   Platelets 198 150 - 400 K/uL  Basic metabolic panel     Status: Abnormal   Collection Time: 08/22/15 12:00 PM  Result Value Ref Range   Sodium 137 135 - 145 mmol/L   Potassium 3.5 3.5 - 5.1 mmol/L   Chloride 101 101 - 111 mmol/L   CO2 29 22 - 32 mmol/L   Glucose, Bld 165 (H) 65 - 99 mg/dL   BUN 19 6 - 20 mg/dL   Creatinine, Ser 1.48 (H) 0.61 - 1.24 mg/dL   Calcium 8.1 (L) 8.9 - 10.3 mg/dL   GFR calc non Af Amer 50 (L) >60 mL/min   GFR calc Af Amer 58 (L) >60 mL/min   Anion gap 7 5 - 15  I-Stat CG4 Lactic Acid, ED     Status: Abnormal   Collection Time: 08/22/15 12:01 PM  Result Value Ref Range   Lactic Acid, Venous 2.75 (HH) 0.5 - 2.0 mmol/L   Comment NOTIFIED PHYSICIAN   Ammonia     Status: Abnormal   Collection Time: 08/22/15 12:38 PM  Result Value Ref Range   Ammonia 48 (H) 9 - 35 umol/L  Hepatic function panel     Status: Abnormal   Collection Time: 08/22/15 12:38 PM  Result Value Ref Range   Total Protein 7.9 6.5 - 8.1 g/dL   Albumin 2.1 (L) 3.5 - 5.0 g/dL   AST 64 (H) 15 - 41 U/L   ALT 24 17 - 63 U/L   Alkaline Phosphatase 293 (H) 38 - 126 U/L   Total Bilirubin 0.4 0.3 - 1.2 mg/dL   Bilirubin, Direct 0.2 0.1 - 0.5 mg/dL   Indirect Bilirubin 0.2 (L) 0.3 - 0.9 mg/dL   Dg Chest Portable 1 View  08/22/2015  CLINICAL DATA:  Right lower leg wound, he itching all over body, some shortness of breath. EXAM: PORTABLE CHEST 1 VIEW COMPARISON:  Chest x-ray dated 06/21/2015. FINDINGS: Heart size is upper normal, stable. Overall cardiomediastinal silhouette is stable in size and configuration.  Atherosclerotic changes again noted at the aortic arch. Chronic pleural thickening and/or effusion at the left lung base, underlying multiple old healed rib fractures. Right lung appears clear. IMPRESSION: No acute findings. Chronic pleural thickening and/or pleural effusion on  the left, underlying of multiple old healed rib fractures. Aortic atherosclerosis. Electronically Signed   By: Franki Cabot M.D.   On: 08/22/2015 14:41    Katheren Shams, DO 08/22/2015, 3:05 PM PGY-2, [facility] pager: [phone], text pages welcome

## 2015-08-22 NOTE — ED Provider Notes (Signed)
CSN: [id number]     Arrival date & time 08/22/15  1130 History   First MD Initiated Contact with Patient 08/22/15 1221     Chief Complaint  Patient presents with  . Cellulitis      HPI Patient with previous history of chronic liver failure and ascites presents with bilateral lower extremity leg redness and a old laceration to the right shin.  Some erythema which she says been present for several days.  Patient denies fever or chills.  Denies nausea vomiting diarrhea. Past Medical History  Diagnosis Date  . Chronic back pain   . Hypertension   . COPD (chronic obstructive pulmonary disease) ([facility])   . ARF (acute renal failure) ([facility])     secondary to PNA  . AAA (abdominal aortic aneurysm) ([facility])   . Alcoholic cirrhosis ([facility])   . GERD (gastroesophageal reflux disease)    Past Surgical History  Procedure Laterality Date  . Video assisted thoracoscopy Left 05/12/2015    Procedure: VIDEO ASSISTED THORACOSCOPY;  Surgeon: Delight Ovens, MD;  Location: [facility] OR;  Service: Thoracic;  Laterality: Left;  . Pleural effusion drainage Left 05/12/2015    Procedure: DRAINAGE OF PLEURAL EFFUSION, MECHANICAL PLEURALDESIS, PLEURAL BIOPSIES;  Surgeon: Delight Ovens, MD;  Location: [facility] OR;  Service: Thoracic;  Laterality: Left;  . Video bronchoscopy N/A 05/12/2015    Procedure: VIDEO BRONCHOSCOPY;  Surgeon: Delight Ovens, MD;  Location[facility] OR;  Service: Thoracic;  Laterality: N/A;  . Chest tube insertion N/A 05/12/2015    Procedure: INSERTION IONGEXB CATH;  Surgeon: Delight Ovens, MD;  Location[facility] OR;  Service: Thoracic;  Laterality: N/A;   Family History  Problem Relation Age of Onset  . Diabetes Mother    Social History  Substance Use Topics  . Smoking status: Current Every Day Smoker -- 0.25 packs/day for 43 years    Types: Cigarettes  . Smokeless tobacco: Never Used  . Alcohol Use: No    Review of Systems  All other systems reviewed and are negative  Allergies  Review of  patient's allergies indicates no known allergies.  Home Medications   Prior to Admission medications   Medication Sig Start Date End Date Taking? Authorizing Provider  albuterol (PROVENTIL HFA;VENTOLIN HFA) 108 (90 Base) MCG/ACT inhaler Inhale 1-2 puffs into the lungs every 4 (four) hours as needed for wheezing or shortness of breath. Reported on 06/01/2015   Yes Historical Provider, MD  DULERA 100-5 MCG/ACT AERO INHALE 2 PUFFS INTO THE LUNGS TWICE DAILY 07/27/15  Yes Jaclyn Shaggy, MD  gabapentin (NEURONTIN) 300 MG capsule Take 1 capsule (300 mg total) by mouth 3 (three) times daily. 06/24/15  Yes Jaclyn Shaggy, MD  Multiple Vitamins-Minerals (MENS MULTI VITAMIN & MINERAL PO) Take 1 tablet by mouth daily. Reported on 06/01/2015   Yes Historical Provider, MD  omeprazole (PRILOSEC) 40 MG capsule Take 1 capsule (40 mg total) by mouth daily. 06/01/15  Yes Jaclyn Shaggy, MD  tiotropium (SPIRIVA) 18 MCG inhalation capsule Place 1 capsule (18 mcg total) into inhaler and inhale daily. 06/01/15  Yes Jaclyn Shaggy, MD  cephALEXin (KEFLEX) 500 MG capsule Take 1 capsule (500 mg total) by mouth every 6 (six) hours. 08/23/15   Palma Holter, MD  collagenase (SANTYL) ointment Apply to right leg wounds everyday, then cover with moist gauze and dry dressing.  Apply to left leg wounds everyday and cover with bandaids. 08/23/15   Palma Holter, MD  methadone (DOLOPHINE) 10 MG/5ML solution Take 34 mLs (  68 mg total) by mouth daily. TAKING 54 DAILY 08/23/15   Palma HolterKanishka G Gunadasa, MD   BP 137/75 mmHg  Pulse 92  Temp(Src) 98.6 F (37 C) (Oral)  Resp 17  Ht 5\' 11"  (1.803 m)  Wt 142 lb 3.2 oz (64.5 kg)  BMI 19.84 kg/m2  SpO2 93% Physical Exam Physical Exam  Nursing note and vitals reviewed. Constitutional: He is oriented to person, place, and time. He appears well-developed and well-nourished. No distress.  HENT:  Head: Normocephalic and atraumatic.  Eyes: Pupils are equal, round, and reactive to light.  Neck:  Normal range of motion.  Cardiovascular: Normal rate and intact distal pulses.   Pulmonary/Chest: No respiratory distress.  Abdominal: Patient has mildly distended abdomen which he says is normal.  Fluid wave present.   Musculoskeletal: Normal range of motion.  Bilateral lower extremity edema with erythema.  3-5 cm V-shaped old laceration noted right shin.  No purulent drainage.  Some eschar noted.  Superficial abrasion noted to right arm.   Neurological: He is alert and oriented to person, place, and time. No cranial nerve deficit.  Skin: Skin is warm and dry. No rash noted.  Psychiatric: He has a normal mood and affect. His behavior is normal.   ED Course  Procedures (including critical care time) Medications  sodium chloride 0.9 % bolus 500 mL (0 mLs Intravenous Stopped 08/22/15 1351)  cefTRIAXone (ROCEPHIN) 1 g in dextrose 5 % 50 mL IVPB (0 g Intravenous Stopped 08/22/15 1513)  oxyCODONE (Oxy IR/ROXICODONE) immediate release tablet 5 mg (5 mg Oral Given 08/23/15 0316)  potassium chloride SA (K-DUR,KLOR-CON) CR tablet 40 mEq (40 mEq Oral Given 08/23/15 0953)    Labs Review Labs Reviewed  MRSA PCR SCREENING - Abnormal; Notable for the following:    MRSA by PCR POSITIVE (*)    All other components within normal limits  CBC - Abnormal; Notable for the following:    Hemoglobin 12.5 (*)    RDW 15.7 (*)    All other components within normal limits  BASIC METABOLIC PANEL - Abnormal; Notable for the following:    Glucose, Bld 165 (*)    Creatinine, Ser 1.48 (*)    Calcium 8.1 (*)    GFR calc non Af Amer 50 (*)    GFR calc Af Amer 58 (*)    All other components within normal limits  AMMONIA - Abnormal; Notable for the following:    Ammonia 48 (*)    All other components within normal limits  HEPATIC FUNCTION PANEL - Abnormal; Notable for the following:    Albumin 2.1 (*)    AST 64 (*)    Alkaline Phosphatase 293 (*)    Indirect Bilirubin 0.2 (*)    All other components within normal  limits  COMPREHENSIVE METABOLIC PANEL - Abnormal; Notable for the following:    Potassium 3.3 (*)    CO2 34 (*)    Creatinine, Ser 1.31 (*)    Calcium 7.8 (*)    Albumin 1.7 (*)    AST 53 (*)    Alkaline Phosphatase 229 (*)    Total Bilirubin 0.2 (*)    GFR calc non Af Amer 58 (*)    Anion gap 3 (*)    All other components within normal limits  CBC - Abnormal; Notable for the following:    RBC 4.04 (*)    Hemoglobin 11.6 (*)    HCT 37.3 (*)    RDW 15.8 (*)    All other  components within normal limits  I-STAT CG4 LACTIC ACID, ED - Abnormal; Notable for the following:    Lactic Acid, Venous 2.75 (*)    All other components within normal limits  I-STAT CG4 LACTIC ACID, ED    Imaging Review No results found. I have personally reviewed and evaluated these images and lab results as part of my medical decision-making.   EKG Interpretation None      MDM   Final diagnoses:  Cellulitis         Nelva Nayobert Chirstine Defrain, MD 08/25/15 1245

## 2015-08-22 NOTE — ED Notes (Signed)
Nurse notified of lactic acid

## 2015-08-22 NOTE — ED Notes (Signed)
Pt reports bilateral leg redness and swelling, has wound to right leg x 1 week. Has multiple skin tears to legs and arms.

## 2015-08-23 DIAGNOSIS — L03119 Cellulitis of unspecified part of limb: Secondary | ICD-10-CM | POA: Diagnosis not present

## 2015-08-23 LAB — COMPREHENSIVE METABOLIC PANEL
ALT: 19 U/L (ref 17–63)
AST: 53 U/L — AB (ref 15–41)
Albumin: 1.7 g/dL — ABNORMAL LOW (ref 3.5–5.0)
Alkaline Phosphatase: 229 U/L — ABNORMAL HIGH (ref 38–126)
Anion gap: 3 — ABNORMAL LOW (ref 5–15)
BUN: 19 mg/dL (ref 6–20)
CHLORIDE: 101 mmol/L (ref 101–111)
CO2: 34 mmol/L — ABNORMAL HIGH (ref 22–32)
CREATININE: 1.31 mg/dL — AB (ref 0.61–1.24)
Calcium: 7.8 mg/dL — ABNORMAL LOW (ref 8.9–10.3)
GFR calc Af Amer: >60 mL/min (ref >60)
GFR, EST NON AFRICAN AMERICAN: 58 mL/min — AB (ref >60)
Glucose, Bld: 88 mg/dL (ref 65–99)
Potassium: 3.3 mmol/L — ABNORMAL LOW (ref 3.5–5.1)
Sodium: 138 mmol/L (ref 135–145)
Total Bilirubin: 0.2 mg/dL — ABNORMAL LOW (ref 0.3–1.2)
Total Protein: 6.5 g/dL (ref 6.5–8.1)

## 2015-08-23 LAB — CBC
HCT: 37.3 % — ABNORMAL LOW (ref 39.0–52.0)
Hemoglobin: 11.6 g/dL — ABNORMAL LOW (ref 13.0–17.0)
MCH: 28.7 pg (ref 26.0–34.0)
MCHC: 31.1 g/dL (ref 30.0–36.0)
MCV: 92.3 fL (ref 78.0–100.0)
PLATELETS: 171 10*3/uL (ref 150–400)
RBC: 4.04 MIL/uL — ABNORMAL LOW (ref 4.22–5.81)
RDW: 15.8 % — AB (ref 11.5–15.5)
WBC: 4 10*3/uL (ref 4.0–10.5)

## 2015-08-23 MED ORDER — COLLAGENASE 250 UNIT/GM EX OINT: TOPICAL_OINTMENT | CUTANEOUS | Status: DC

## 2015-08-23 MED ORDER — CEPHALEXIN 500 MG PO CAPS: 500.0000 mg | ORAL_CAPSULE | Freq: Four times a day (QID) | ORAL | Status: DC

## 2015-08-23 MED ORDER — MUPIROCIN 2 % EX OINT
1.0000 | TOPICAL_OINTMENT | Freq: Two times a day (BID) | CUTANEOUS | Status: DC
Start: 2015-08-23 — End: 2015-08-23
  Administered 2015-08-23: 1 via NASAL
  Filled 2015-08-23: qty 22

## 2015-08-23 MED ORDER — CEPHALEXIN 500 MG PO CAPS
500.0000 mg | ORAL_CAPSULE | Freq: Two times a day (BID) | ORAL | Status: DC
  Administered 2015-08-23: 500 mg via ORAL
  Filled 2015-08-23: qty 1

## 2015-08-23 MED ORDER — OXYCODONE HCL 5 MG PO TABS
5.0000 mg | ORAL_TABLET | Freq: Once | ORAL | Status: AC
Start: 2015-08-23 — End: 2015-08-23
  Administered 2015-08-23: 5 mg via ORAL
  Filled 2015-08-23 (×2): qty 1

## 2015-08-23 MED ORDER — METHADONE HCL 10 MG/5ML PO SOLN: 68.0000 mg | Freq: Every day | ORAL | Status: DC

## 2015-08-23 MED ORDER — COLLAGENASE 250 UNIT/GM EX OINT
TOPICAL_OINTMENT | Freq: Every day | CUTANEOUS | Status: DC
  Administered 2015-08-23: 13:00:00 via TOPICAL
  Filled 2015-08-23: qty 30

## 2015-08-23 MED ORDER — CHLORHEXIDINE GLUCONATE CLOTH 2 % EX PADS
6.0000 | MEDICATED_PAD | Freq: Every day | CUTANEOUS | Status: DC
  Administered 2015-08-23: 6 via TOPICAL

## 2015-08-23 MED ORDER — POTASSIUM CHLORIDE CRYS ER 20 MEQ PO TBCR
40.0000 meq | EXTENDED_RELEASE_TABLET | Freq: Once | ORAL | Status: AC
  Administered 2015-08-23: 40 meq via ORAL
  Filled 2015-08-23: qty 4

## 2015-08-23 MED ORDER — CEPHALEXIN 500 MG PO CAPS: 500.0000 mg | ORAL_CAPSULE | Freq: Two times a day (BID) | ORAL | Status: DC

## 2015-08-23 NOTE — Discharge Instructions (Signed)
You were admitted for cellulitis (skin infection) in your lower extremities. The wound care team saw you while you were hospitalized and recommended Santyl ointment for your leg wounds. Physical Therapy saw you as well and did not have further recommendations. You were started on IV antibiotic for your cellulitis. You were switched to oral antibiotic called Keflex 500mg  every 6 hours; you need to take 1 tablet every 6 hours for a total of  4 more days; you will complete this on  6/29.   Your home Lasix, Spironolactone, and Potassium tablet were held upon discharte because you had a mild kidney injury which did improve with fluids. You will get a call from the [facility] on Wednesday to set up an appointment for this Thursday or Friday. They will recheck your kidney function prior to starting these medications. If you are not able to be seen this Thursday or Friday, please call your primary care provider and ask if you should restart your Lasix and Spironolactone and Potassium.

## 2015-08-23 NOTE — Evaluation (Signed)
Occupational Therapy Evaluation and Discharge Patient Details Name: Benjamin MullJames Stachnik MRN: [id number] DOB: [birth year] Today's Date: 08/23/2015    History of Present Illness pt presents with LE Cellulitis.  pt with hx of Chronic Methadone, Chronic Pain, Hx Polysubstance, Alcoholic Cirrhosis, HTN, COPD, Home O2, Ascites, Recent Rib fxs, AAA, Adrenal Mass, and Recurrent L Pleural Effusion.     Clinical Impression   Pt ambulates with a walker or cane and can perform self care at a modified independent level at baseline. Pt with pain limiting speed of mobility, but able to perform at his baseline. 02 sats between 87-91 on RA. No further OT needs.    Follow Up Recommendations  No OT follow up    Equipment Recommendations  None recommended by OT    Recommendations for Other Services       Precautions / Restrictions Precautions Precautions: None Restrictions Weight Bearing Restrictions: No      Mobility Bed Mobility Overal bed mobility: Modified Independent             General bed mobility comments: Moves slowly, but no A needed.    Transfers Overall transfer level: Needs assistance Equipment used: Rolling walker (2 wheeled) Transfers: Sit to/from Stand Sit to Stand: Supervision         General transfer comment: supervision for safety, stands slowly    Balance Overall balance assessment: Modified Independent                                          ADL Overall ADL's : Modified independent                                       General ADL Comments: Pt with difficulty, but able to access his feet for bathing and dressing. Sometimes asks for assist from his wife.     Vision     Perception     Praxis      Pertinent Vitals/Pain Pain Assessment: Faces Faces Pain Scale: Hurts little more Pain Location: R ribs Pain Descriptors / Indicators: Grimacing;Guarding Pain Intervention(s): Monitored during session;Repositioned      Hand Dominance Right   Extremity/Trunk Assessment Upper Extremity Assessment Upper Extremity Assessment: Overall WFL for tasks assessed (arthritic changes in hands)   Lower Extremity Assessment Lower Extremity Assessment: Defer to PT evaluation   Cervical / Trunk Assessment Cervical / Trunk Assessment: Kyphotic   Communication Communication Communication: No difficulties   Cognition Arousal/Alertness: Awake/alert Behavior During Therapy: WFL for tasks assessed/performed Overall Cognitive Status: Within Functional Limits for tasks assessed                     General Comments       Exercises       Shoulder Instructions      Home Living Family/patient expects to be discharged to:: Private residence Living Arrangements: Spouse/significant other Available Help at Discharge: Family;Available 24 hours/day Type of Home: Apartment Home Access: Stairs to enter Entrance Stairs-Number of Steps: 1 Entrance Stairs-Rails: None Home Layout: One level     Bathroom Shower/Tub: Tub/shower unit;Curtain Shower/tub characteristics: Engineer, building servicesCurtain Bathroom Toilet: Standard Bathroom Accessibility: Yes How Accessible: Accessible via walker Home Equipment: Walker - 2 wheels;Walker - 4 wheels;Bedside commode;Cane - single point;Shower seat Lexicographer(0xygen)          Prior Functioning/Environment  Level of Independence: Independent with assistive device(s)        Comments: pt indicates he uses his RW or cane depending on how he is feeling, uses 2-3L 02, stands to shower      OT Diagnosis: Generalized weakness;Acute pain   OT Problem List:     OT Treatment/Interventions:      OT Goals(Current goals can be found in the care plan section) Acute Rehab OT Goals Patient Stated Goal: For LEs to heal.  OT Frequency:     Barriers to D/C:            Co-evaluation              End of Session Equipment Utilized During Treatment: Rolling walker  Activity Tolerance: Patient  tolerated treatment well Patient left: in bed;with call bell/phone within reach   Time: 1319-1351 OT Time Calculation (min): 32 min Charges:  OT General Charges $OT Visit: 1 Procedure OT Evaluation $OT Eval Moderate Complexity: 1 Procedure G-Codes: OT G-codes **NOT FOR INPATIENT CLASS** Functional Assessment Tool Used: clinical judgement Functional Limitation: Self care Self Care Current Status ([id number](G8987): At least 1 percent but less than 20 percent impaired, limited or restricted Self Care Discharge Status [phone](G8989): At least 1 percent but less than 20 percent impaired, limited or restricted  Evern BioMayberry, [employer] Hoefling [place] 08/23/2015, 1:59 PM  [phone]

## 2015-08-23 NOTE — Care Management Note (Signed)
Case Management Note  Patient Details  Name: Benjamin [name] MRN: [id number] Date of Birth: [birth year]  Subjective/Objective:                    Action/[name]:  Spoke to patient regarding home oxygen . Patient gets his oxygen through [facility] . He has portable oxygen tank in room for when he discharges . He is interested in getting a smaller portable tank . Called [facility] [place] who will call DME office. Patient aware. Expected Discharge Date:  08/27/15               Expected Discharge [name]:  Home/Self Care  In-House Referral:     Discharge planning Services     Post Acute Care Choice:    Choice offered to:  Patient, Spouse  DME Arranged:    DME Agency:     HH Arranged:    HH Agency:     Status of Service:  In process, will continue to follow  If discussed at Long Length of Stay Meetings, dates discussed:    Additional Comments:  Benjamin PlanWile, [name] [name], RN 08/23/2015, 9:44 AM

## 2015-08-23 NOTE — Progress Notes (Signed)
Pt discharged home in stable condition. Discharge teaching provided with no concerns voiced

## 2015-08-23 NOTE — Discharge Summary (Signed)
Family Medicine Teaching [facility] Discharge Summary  Patient name: Benjamin [name] Medical record number: [id number] Date of birth: January 21, 1956 Age: 60 y.o. Gender: male Date of Admission: 08/22/2015  Date of Discharge: 08/23/15 Admitting Physician: Carney Living, MD  Primary Care Provider: Jaclyn Shaggy, MD Consultants: none  Indication for Hospitalization: Cellulitis  Discharge Diagnoses/Problem List:  Cellulitis Liver Cirrhosis HTN  Disposition: home  Discharge Condition: stable   Discharge Exam:  General: NAD, resting comfortably, awake and alert.  Cardiovascular: RRR, no m/r/g Respiratory: normal wob, CTAB Abdomen: soft, NT, surgical scars noted, mid distention (patient reports at baseline)  Extremities: Bilateral lower extremities with erythema up to mid shin with erythema of RLE slightly past mid shin. Swelling noted. Wound on RLE: 4.5cm by 2cm; wound 2cm. LLE superficial wounds 1cm in diameter. ; no asterixis  Skin: multiple superficial wounds/bruising   Brief Hospital Course:  Benjamin [name] is a 60 y.o. male who presented with bilateral lower extremity cellulitis. PMH significant for HTN, COPD (2L O2 at baseline), GERD, and alcoholic cirrhosis.   Cellulitis:  Patient states that she presented to the ED because he has a "hole" in his right leg. States that the wound first appeared about two weeks ago when he fell. Started to heal but then about a week ago he bumped his leg on the bed and reopened the wound. States it has not been getting better since then. Denied pain at site. Denied fevers/chills. Denies drainage from wound. Endorses erythema and swelling in legs. He walks with a cane/walker at baseline.   Bilateral LE Cellulitis with Right Leg Wound:  Patient was noted to have erythema of bilateral lower extremities up to mid to 2/3 up his shin. He was also noted to have multiple wounds on lower extremities with one in particular that was more significant that  the others. He had a wound with eschar over the base with diameter of 4.5cm by 2cm. He had smaller more superficial wounds on his left lower extremity with eschars. He was afebrile. He had no leukocytosis in labs. He did have a lactic acidosis of 2.75 which resolved to 1.71 after 500cc bolus. He was noted to have AKI (note below). He was given one dose of Ceftriaxone. He reported his swelling improved some overnight. He was transitioned to Keflex  q 6 to complete 4 more days of therapy. Wound care was consulted and recommended Santyl ointment for debridement of wounds. Wound care consult also recommended ABI to assess perfusion of bilateral lower extremities.   AKI, improving:  Creatinine on admission was 1.48. Patient's baseline in 0.08. Thought to be combination of nephrotoxic medications (Lasix and Spironolactone) and some poor fluid intake. Creatinine improved to 1.31 with IVF. Lasix and Spironolactone were held upon discharge due to AKI. Treatment team called PCP's clinic; the Clinic reported they will call patient on Wednesday 6/28 to schedule an hospital follow up appointment either 6/29 or 6/30.   Hypokalemia: Potassium on 3.3 the morning of discharge. This was repleted.   Cirrhosis:  As noted above Lasix and Spironolactone (and Potassium supplement) were held upon admission and discharge due to AKI. Additionally, ammonia was elevated on admission to 48. Patient was not altered and did not have asterixis. Patient is not on lactulose or rifaximin at home. Consider outpatient follow up with GI.   Other chronic medications were continued during hospitalization.   Issues for Follow Up:  - repeat BMP to follow Creatinine; restart Lasix and Spironolactone when indicated. - f/u cellulitis  Significant Procedures: none  Significant Labs and Imaging:   Recent Labs Lab 08/22/15 1200 08/23/15 0635  WBC 5.5 4.0  HGB 12.5* 11.6*  HCT 39.4 37.3*  PLT 198 171    Recent Labs Lab  08/22/15 1200 08/22/15 1238 08/23/15 0635  NA 137  --  138  K 3.5  --  3.3*  CL 101  --  101  CO2 29  --  34*  GLUCOSE 165*  --  88  BUN 19  --  19  CREATININE 1.48*  --  1.31*  CALCIUM 8.1*  --  7.8*  ALKPHOS  --  293* 229*  AST  --  64* 53*  ALT  --  24 19  ALBUMIN  --  2.1* 1.7*  Hepatic Function Panel: AST 64, ALT 24, Alkaline Phosphatase 293  CMP on day of discharge: AST 53, ALT 19, Alkaline Phosphatase 229 Ammonia: 48 Lactic Acid: 2.75 > 1.71  Results/Tests Pending at Time of Discharge: none  Discharge Medications:    Medication List    STOP taking these medications        furosemide 40 MG tablet  Commonly known as:  LASIX     potassium chloride 20 MEQ packet  Commonly known as:  KLOR-CON     spironolactone 25 MG tablet  Commonly known as:  ALDACTONE      TAKE these medications        albuterol 108 (90 Base) MCG/ACT inhaler  Commonly known as:  PROVENTIL HFA;VENTOLIN HFA  Inhale 1-2 puffs into the lungs every 4 (four) hours as needed for wheezing or shortness of breath. Reported on 06/01/2015     cephALEXin 500 MG capsule  Commonly known as:  KEFLEX  Take 1 capsule (500 mg total) by mouth every 6 (six) hours.     collagenase ointment  Commonly known as:  SANTYL  Apply to right leg wounds everyday, then cover with moist gauze and dry dressing.  Apply to left leg wounds everyday and cover with bandaids.     DULERA 100-5 MCG/ACT Aero  Generic drug:  mometasone-formoterol  INHALE 2 PUFFS INTO THE LUNGS TWICE DAILY     gabapentin 300 MG capsule  Commonly known as:  NEURONTIN  Take 1 capsule (300 mg total) by mouth 3 (three) times daily.     MENS MULTI VITAMIN & MINERAL PO  Take 1 tablet by mouth daily. Reported on 06/01/2015     methadone 10 MG/5ML solution  Commonly known as:  DOLOPHINE  Take 34 mLs (68 mg total) by mouth daily. TAKING 54 DAILY     omeprazole 40 MG capsule  Commonly known as:  PRILOSEC  Take 1 capsule (40 mg total) by mouth daily.      tiotropium 18 MCG inhalation capsule  Commonly known as:  SPIRIVA  Place 1 capsule (18 mcg total) into inhaler and inhale daily.        Discharge Instructions: Please refer to Patient Instructions section of EMR for full details.  Patient was counseled important signs and symptoms that should prompt return to medical care, changes in medications, dietary instructions, activity restrictions, and follow up appointments.   Follow-Up Appointments:     Follow-up Information    Follow up with  [facility].   Why:  The office will call you on Wednesday to set up an appointment this Thursday or Friday    Contact information:   [address] [place] [id number] [phone]  Palma HolterKanishka G Gunadasa, MD 08/23/2015, 3:07 PM PGY-1, [facility]

## 2015-08-23 NOTE — Consult Note (Signed)
WOC wound consult note Reason for Consult: Consult requested for right leg wound.  Pt states he bumped it against an object several weeks ago, then recently re-injured the location and it has not healed. Wound type: Right anterior calf with 3 areas of full thickness necrotic abrasions; 3X5, 1X.5cm, and .3X.3cm.  All are 100% dry tightly adhered eschar; no odor or drainage. Left leg wounds with the same appearance; left knee .3X.3cm, left anterior calf .4X.3cm, .3X.3cm. Right arm with patchy areas of partial thickness skin loss; affected areas approx 5X5cm, red and dry, surrounding skin is thin and fragile. Dressing procedure/placement/frequency: Silicone foam dressing to right arm to protect from further injury.  Santyl ointment to provide enzymatic debridement of nonviable tissue to bilat leg wounds.  Pt could benefit from ABI to assess perfusion of bilat legs; please order if desired. Discussed plan of care with patient and his wife and they verbalize understanding. Please re-consult if further assistance is needed.  Thank-you,  Cammie Mcgeeawn Javiana Anwar MSN, RN, CWOCN, [place]AP, CNS [phone]

## 2015-08-23 NOTE — Evaluation (Signed)
Physical Therapy Evaluation Patient Details Name: Benjamin MullJames Lichtman MRN: [id number] DOB: May 06, 1955 Today's Date: 08/23/2015   History of Present Illness  pt presents with LE Cellulitis.  pt with hx of Chronic Methadone, Chronic Pain, Hx Polysubstance, Alcoholic Cirrhosis, HTN, COPD, Home O2, Ascites, Recent Rib fxs, AAA, Adrenal Mass, and Recurrent L Pleural Effusion.    Clinical Impression  Pt appears to be at his baseline level of mobility per pt report.  Only difference pt notes is continued rib pain from recent rib fxs.  Pt will have good support at home and no further acute PT needs at this time.  Will sign off.      Follow Up Recommendations No PT follow up;Supervision - Intermittent    Equipment Recommendations  None recommended by PT    Recommendations for Other Services       Precautions / Restrictions Precautions Precautions: None Restrictions Weight Bearing Restrictions: No      Mobility  Bed Mobility Overal bed mobility: Modified Independent             General bed mobility comments: Moves slowly, but no A needed.    Transfers Overall transfer level: Modified independent Equipment used: Rolling walker (2 wheeled) Transfers: Sit to/from Stand Sit to Stand: Supervision         General transfer comment: cued for UE use only.    Ambulation/Gait Ambulation/Gait assistance: Supervision Ambulation Distance (Feet): 500 Feet Assistive device: Rolling walker (2 wheeled) Gait Pattern/deviations: Step-through pattern;Decreased stride length     General Gait Details: pt moves slowly, but no A needed.  pt indicates he feels like he is moving as he normally would.    Stairs            Wheelchair Mobility    Modified Rankin (Stroke Patients Only)       Balance Overall balance assessment: Modified Independent                                           Pertinent Vitals/Pain Pain Assessment: Faces Faces Pain Scale: Hurts a little  bit Pain Location: pt indicates rib pain with bed mobility.   Pain Descriptors / Indicators: Grimacing;Guarding Pain Intervention(s): Monitored during session;Premedicated before session;Repositioned    Home Living Family/patient expects to be discharged to:: Private residence Living Arrangements: Spouse/significant other Available Help at Discharge: Family;Available 24 hours/day Type of Home: Apartment Home Access: Stairs to enter Entrance Stairs-Rails: None Entrance Stairs-Number of Steps: 1 Home Layout: One level Home Equipment: Walker - 2 wheels;Walker - 4 wheels;Bedside commode;Cane - single point      Prior Function Level of Independence: Independent with assistive device(s)         Comments: pt indicates he uses his RW or cane depending on how he is feeling.       Hand Dominance   Dominant Hand: Right    Extremity/Trunk Assessment   Upper Extremity Assessment: Defer to OT evaluation           Lower Extremity Assessment: Generalized weakness      Cervical / Trunk Assessment: Kyphotic  Communication   Communication: No difficulties  Cognition Arousal/Alertness: Awake/alert Behavior During Therapy: WFL for tasks assessed/performed Overall Cognitive Status: Within Functional Limits for tasks assessed                      General Comments  Exercises        Assessment/Plan    PT Assessment Patent does not need any further PT services  PT Diagnosis Difficulty walking   PT Problem List    PT Treatment Interventions     PT Goals (Current goals can be found in the Care Plan section) Acute Rehab PT Goals Patient Stated Goal: For LEs to heal. PT Goal Formulation: All assessment and education complete, DC therapy    Frequency     Barriers to discharge        Co-evaluation               End of Session Equipment Utilized During Treatment: Gait belt;Oxygen Activity Tolerance: Patient tolerated treatment well Patient left: in  bed;with call bell/phone within reach;with family/visitor present Nurse Communication: Mobility status    Functional Assessment Tool Used: Clinical Judgement Functional Limitation: Mobility: Walking and moving around Mobility: Walking and Moving Around Current Status ([id number](G8978): At least 1 percent but less than 20 percent impaired, limited or restricted Mobility: Walking and Moving Around Goal Status [phone](G8979): At least 1 percent but less than 20 percent impaired, limited or restricted Mobility: Walking and Moving Around Discharge Status [phone](G8980): At least 1 percent but less than 20 percent impaired, limited or restricted    Time: 0937-1015 PT Time Calculation (min) (ACUTE ONLY): 38 min   Charges:   PT Evaluation $PT Eval Moderate Complexity: 1 Procedure PT Treatments $Gait Training: 8-22 mins   PT G Codes:   PT G-Codes **NOT FOR INPATIENT CLASS** Functional Assessment Tool Used: Clinical Judgement Functional Limitation: Mobility: Walking and moving around Mobility: Walking and Moving Around Current Status (B1478(G8978): At least 1 percent but less than 20 percent impaired, limited or restricted Mobility: Walking and Moving Around Goal Status [phone](G8979): At least 1 percent but less than 20 percent impaired, limited or restricted Mobility: Walking and Moving Around Discharge Status [phone](G8980): At least 1 percent but less than 20 percent impaired, limited or restricted    Sunny SchleinRitenour, Prue Lingenfelter F, [place] CarolinaPT [phone] 08/23/2015, 11:36 AM

## 2015-08-23 NOTE — Progress Notes (Signed)
Family Medicine Teaching Service Daily Progress Note Intern Pager: [phone]  Patient name: Benjamin [name] Medical record number: [id number] Date of birth: [birth year] Age: 60 y.o. Gender: male  Primary Care Provider: Arnoldo Morale, MD Consultants: none Code Status: FULL  Pt Overview and Major Events to Date:  6/25: admit  Assessment and Plan: [name] is a 60 y.o. male presenting with bilateral lower extremity cellulitis. PMH significant for HTN, COPD (2L O2 at baseline), GERD, and alcoholic cirrhosis.   Cellulitis/Leg wound. Cellulitis on bilateral lower extremities with RLE leg wound. Afebrile and VSS. No leukocytosis.  - wound care consulted - received CTX in ED (6/25)- good for 24 hours; transition to PO Abx Keflex '500mg'$  q 6hr (5 days) - monitor for fevers - PT/OT consult  AKI, improving. Baseline Cr .8. On admission Cr 1.48 > 1.31 Likely combination of his nephrotoxic medications (Lasix and spironolactone) along with some poor fluid intake.  - s/p IVF for 12hrs - avoid nephrotoxic medications   Hypokalemia: 3.3 this AM Kdur 66mq x 1   Lactic Acidosis, resolved. Initially in ED lactic acid elevated at 2.75. After 500cc bolus lactic acid returned to normal levels. Most likely was elevated due to poor hydration. LA 1.71  Cirrhosis. H/o alcoholic cirrhosis with ascites. Also with history of Hep C that was treated. Ammonia elevated on admission to 48. Has chronic abdominal distention without ascites. Alk Phos elevated at 293 > 229 (improving). - will monitor - holding home Lasix and spironolactone in setting of AKI  COPD. At baseline. Uses 2-4L O2 at home. CXR with no acute finding. Has a chronic pleural effusion on left.  -continue O2 therapy to keep saturation above 88% -continue home medications dulera, spiriva, and prn albuterol  HTN. Normotensive on admission.  -holding home lasix and spironolactone.   Tobacco use. Current everyday smoker -Nicotine Patch  ordered  H/o substance use. Continue methadone. Avoid narcotic pain medications.   FEN/GI: Heart healthy diet. S/p NS'@100mL'$  for 12 hours. PPI. Prophylaxis: Lovenox  Disposition: likely discharge today   Subjective:  Doing well. Reports LE look a lot better, with decreased swelling and redness. No fevers or chills.   Objective: Temp:  [97.3 F (36.3 C)-98.6 F (37 C)] 98.6 F (37 C) (06/26 0519) Pulse Rate:  [95-118] 99 (06/26 0519) Resp:  [16-18] 17 (06/26 0519) BP: (110-161)/(75-112) 139/86 mmHg (06/26 0519) SpO2:  [93 %-98 %] 93 % (06/26 0844) Weight:  [64.5 kg (142 lb 3.2 oz)] 64.5 kg (142 lb 3.2 oz) (06/25 1710) Physical Exam: General: NAD, resting comfortably, awake and alert.  Cardiovascular: RRR, no m/r/g Respiratory: normal wob, CTAB Abdomen: soft, NT, surgical scars noted, mid distention (patient reports at baseline)  Extremities: Bilateral lower extremities with erythema up to mid shin with erythema of RLE slightly past mid shin. Swelling noted. Wound on RLE: 4.5cm by 2cm; wound 2cm. LLE superficial wounds 1cm in diameter. ; no asterixis  Skin: multiple superficial wounds/bruising   Laboratory:  Recent Labs Lab 08/22/15 1200 08/23/15 0635  WBC 5.5 4.0  HGB 12.5* 11.6*  HCT 39.4 37.3*  PLT 198 171    Recent Labs Lab 08/22/15 1200 08/22/15 1238 08/23/15 0635  NA 137  --  138  K 3.5  --  3.3*  CL 101  --  101  CO2 29  --  34*  BUN 19  --  19  CREATININE 1.48*  --  1.31*  CALCIUM 8.1*  --  7.8*  PROT  --  7.9 6.5  BILITOT  --  0.4 0.2*  ALKPHOS  --  293* 229*  ALT  --  24 19  AST  --  64* 53*  GLUCOSE 165*  --  88    Imaging/Diagnostic Tests: FINDINGS: Heart size is upper normal, stable. Overall cardiomediastinal silhouette is stable in size and configuration. Atherosclerotic changes again noted at the aortic arch.  Chronic pleural thickening and/or effusion at the left lung base, underlying multiple old healed rib fractures. Right lung  appears clear.  IMPRESSION: No acute findings. Chronic pleural thickening and/or pleural effusion on the left, underlying of multiple old healed rib fractures.  Aortic atherosclerosis.  Smiley Houseman, MD 08/23/2015, 9:05 AM PGY-1, [facility] Intern pager: [phone], text pages welcome

## 2015-09-01 ENCOUNTER — Inpatient Hospital Stay: Payer: Medicaid Other | Admitting: Family Medicine

## 2015-11-28 DEATH — deceased

## 2017-03-12 IMAGING — CT CT HEAD W/O CM
3 of 5 series · 14 of 47 positions shown, 16 images · non-contrast
Comparison: None.

CLINICAL DATA: Status post fall outside while walking dog. Concern
for head or cervical spine injury. Initial encounter.

EXAM:
CT HEAD WITHOUT CONTRAST
CT CERVICAL SPINE WITHOUT CONTRAST
TECHNIQUE: Multidetector CT imaging of the head and cervical spine was
performed following the standard protocol without intravenous
contrast. Multiplanar CT image reconstructions of the cervical spine
were also generated.

[Series 302: soft tissue, idose (2) · axial · 0.39mm/px · z∈[+90,+250]mm · 8 of 96 slices shown, 10 images]
[im 8/96  brain]
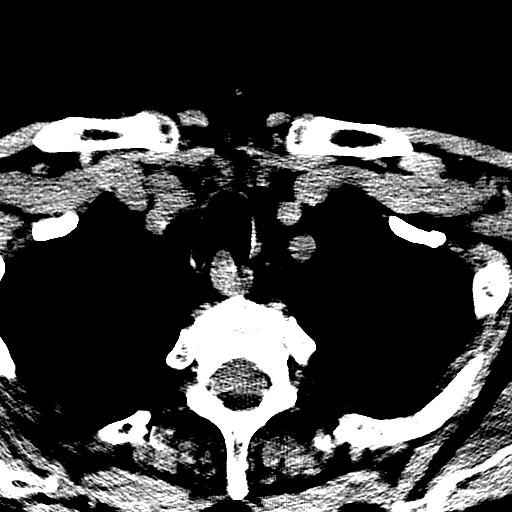
[im 8/96  bone]
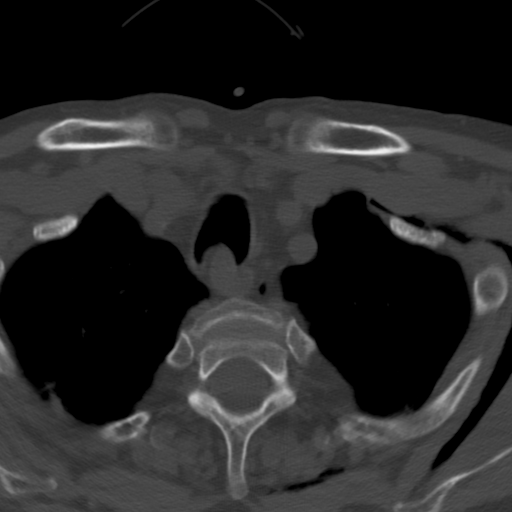
[im 22/96  brain]
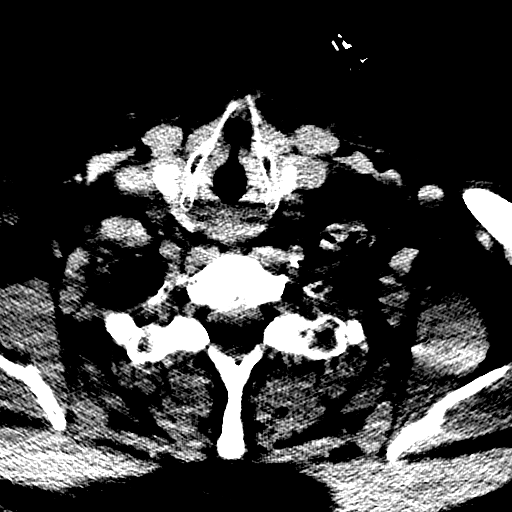
[im 30/96  brain]
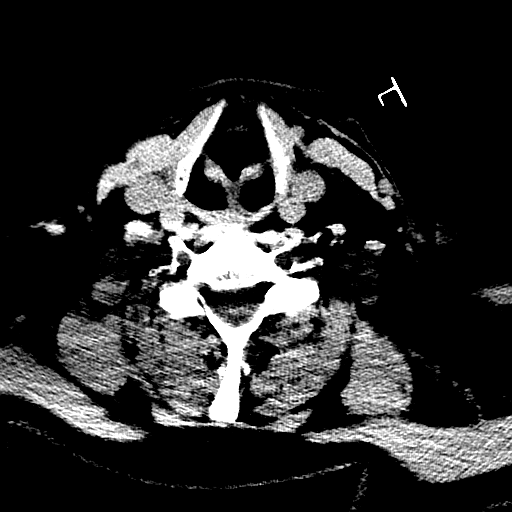
[im 44/96  brain]
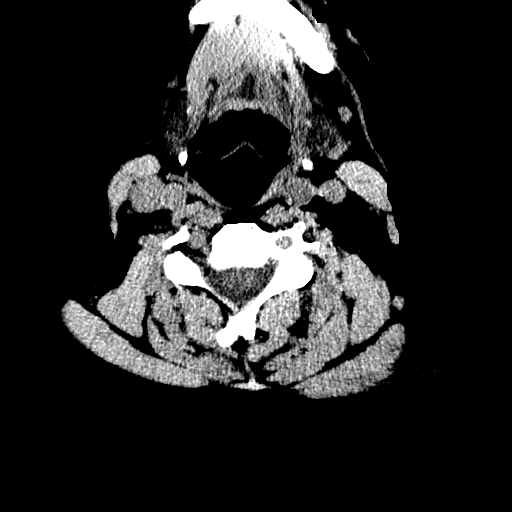
[im 52/96  brain]
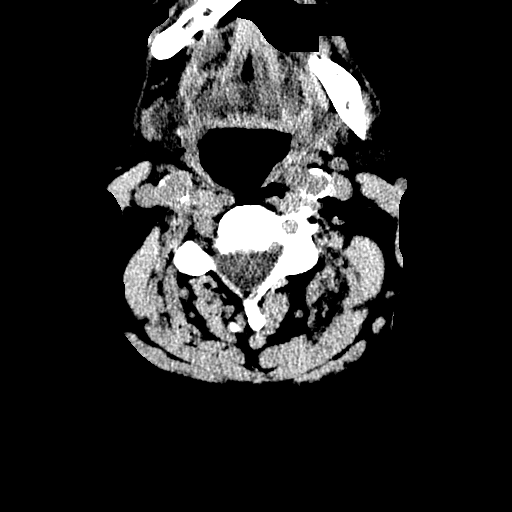
[im 52/96  bone]
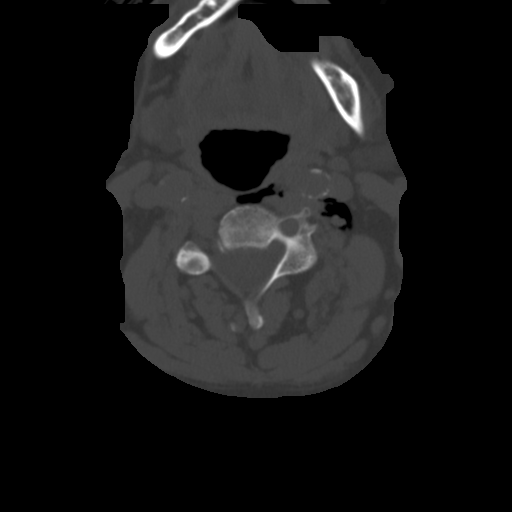
[im 66/96  brain]
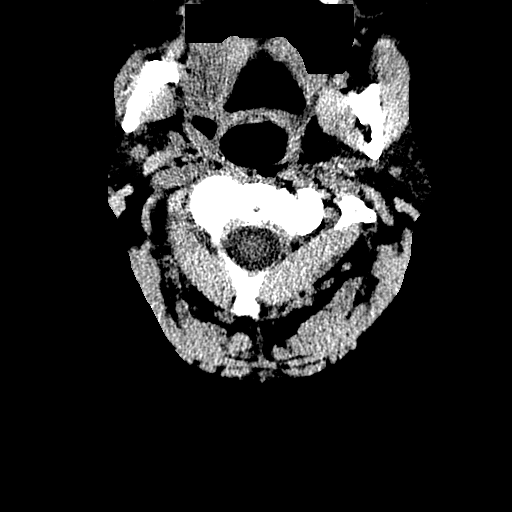
[im 74/96  brain]
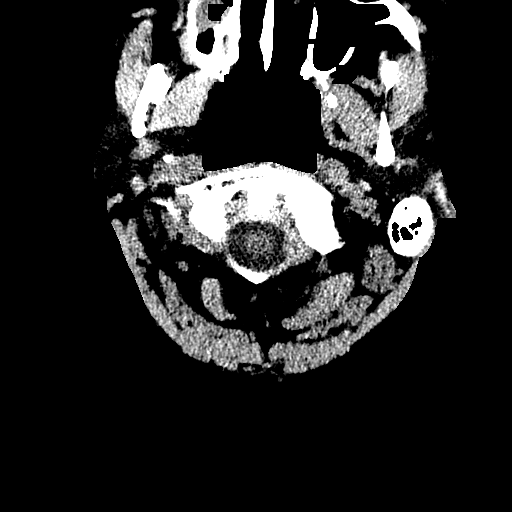
[im 88/96  brain]
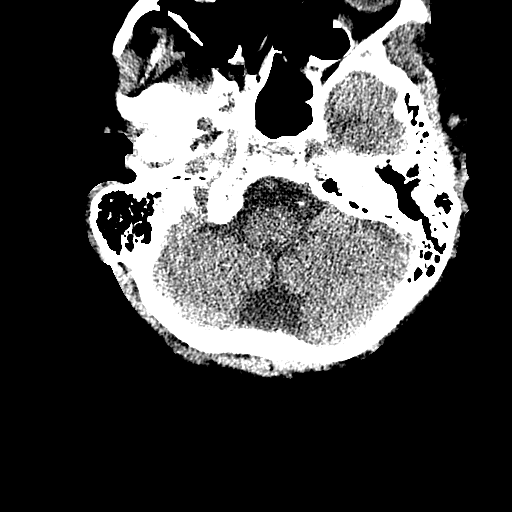

[Series 304: coronal, idose (2) · coronal · 0.34mm/px · 3 of 100 slices shown]
[im 34/100  brain]
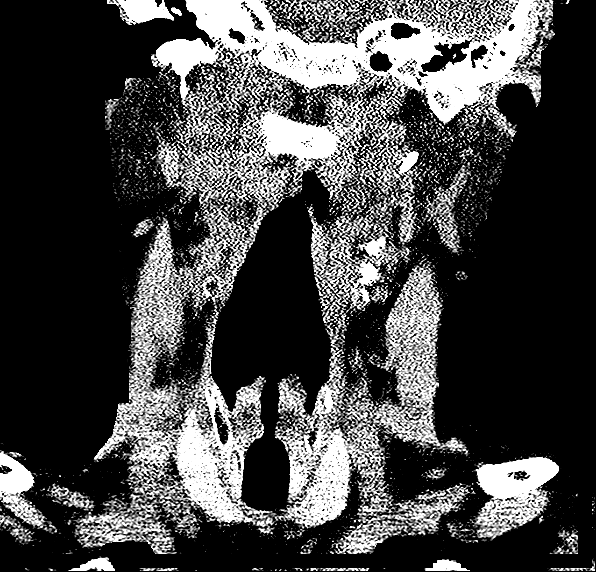
[im 45/100  brain]
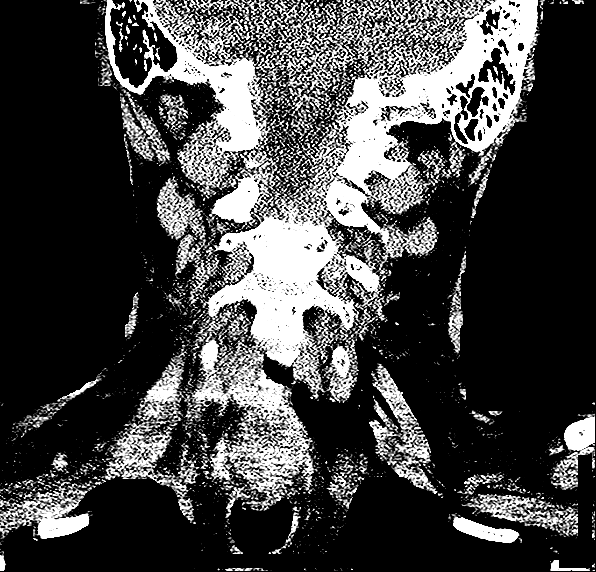
[im 56/100  brain]
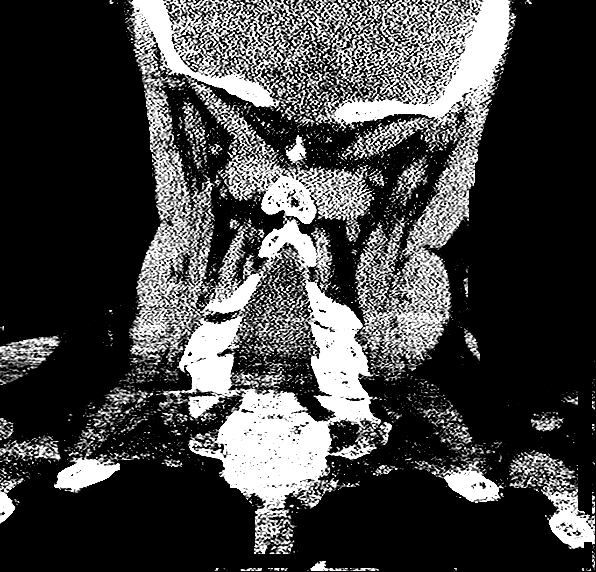

[Series 305: sagittal, idose (2) · sagittal · 0.34mm/px · 3 of 100 slices shown]
[im 34/100  brain]
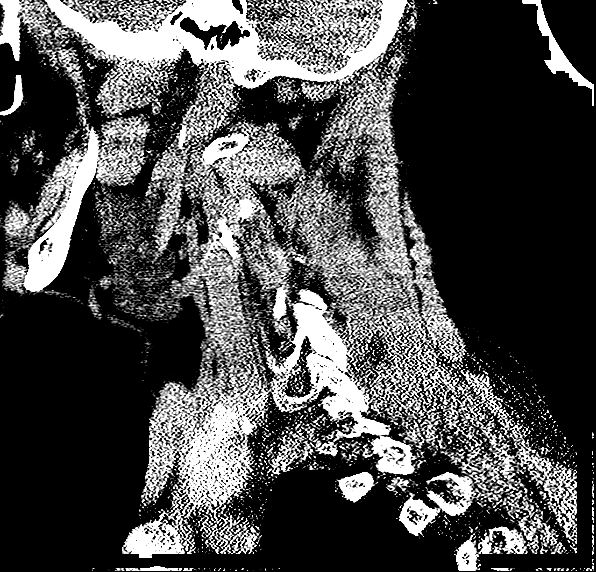
[im 50/100  brain]
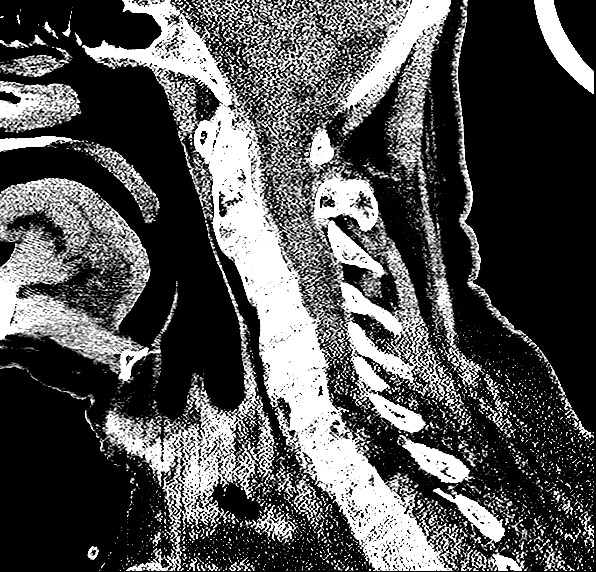
[im 67/100  brain]
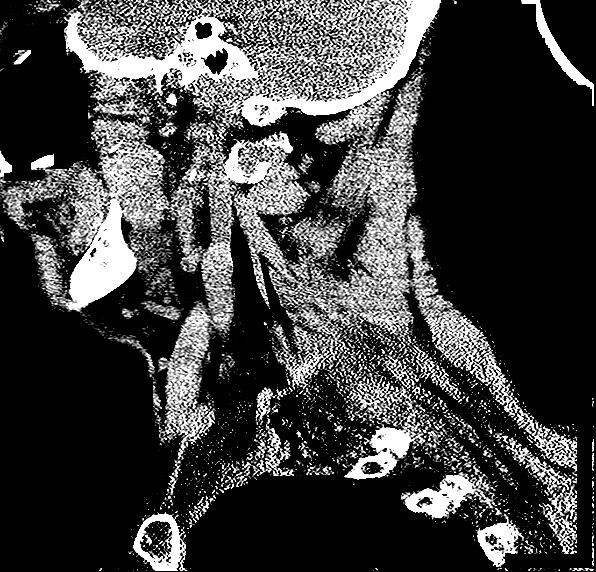

[14 of 47 positions shown; findings below may reference images not displayed]

FINDINGS: CT HEAD FINDINGS

There is no evidence of acute infarction, mass lesion, or intra- or
extra-axial hemorrhage on CT.

Prominence of the ventricles and sulci reflects mild to moderate
cortical volume loss. Mild cerebellar atrophy is noted.

The brainstem and fourth ventricle are within normal limits. The
basal ganglia are unremarkable in appearance. The cerebral
hemispheres demonstrate grossly normal gray-white differentiation.
No mass effect or midline shift is seen.

There is no evidence of fracture; visualized osseous structures are
unremarkable in appearance. The visualized portions of the orbits
are within normal limits. A mucus retention cyst or polyp is noted
at the right maxillary sinus. The remaining paranasal sinuses and
mastoid air cells are well-aerated. No significant soft tissue
abnormalities are seen.

CT CERVICAL SPINE FINDINGS

There is no evidence of acute fracture or subluxation. There is
chronic compression deformity of vertebral body C5. Vertebral bodies
demonstrate normal alignment. Intervertebral disc spaces are
preserved. The visualized neural foramina are grossly unremarkable.

The thyroid gland is unremarkable in appearance. Soft tissue air
noted tracking along the left side of the neck, and along the
prevertebral soft tissues. This extends superiorly from the left
side of the chest. A left-sided pneumothorax is better characterized
on concurrent CT of the chest. Underlying emphysematous change is
noted bilaterally. Calcification is noted at the carotid
bifurcations bilaterally.
IMPRESSION: 1. No evidence of traumatic intracranial injury or fracture.
2. No evidence of acute fracture or subluxation along the cervical
spine.
3. Soft tissue air tracking along the left side of the neck, and
along the prevertebral soft tissues, extending superiorly from the
left side of the chest.
4. Left-sided pneumothorax is better characterized on concurrent CT
of the chest. Underlying emphysematous change noted bilaterally.
5. Mild to moderate cortical volume loss.
6. Mucus retention cyst or polyp noted at the right maxillary sinus.
7. Chronic compression deformity of vertebral body T5.
8. Calcification at the carotid bifurcations bilaterally. Carotid
ultrasound would be helpful for further evaluation, when and as
deemed clinically appropriate.

## 2017-03-14 IMAGING — CR DG ABDOMEN 1V
1 series · 1 of 1 positions shown · non-contrast
Comparison: None.

CLINICAL DATA: Nasogastric tube placement.

EXAM:
ABDOMEN - 1 VIEW

[AP]
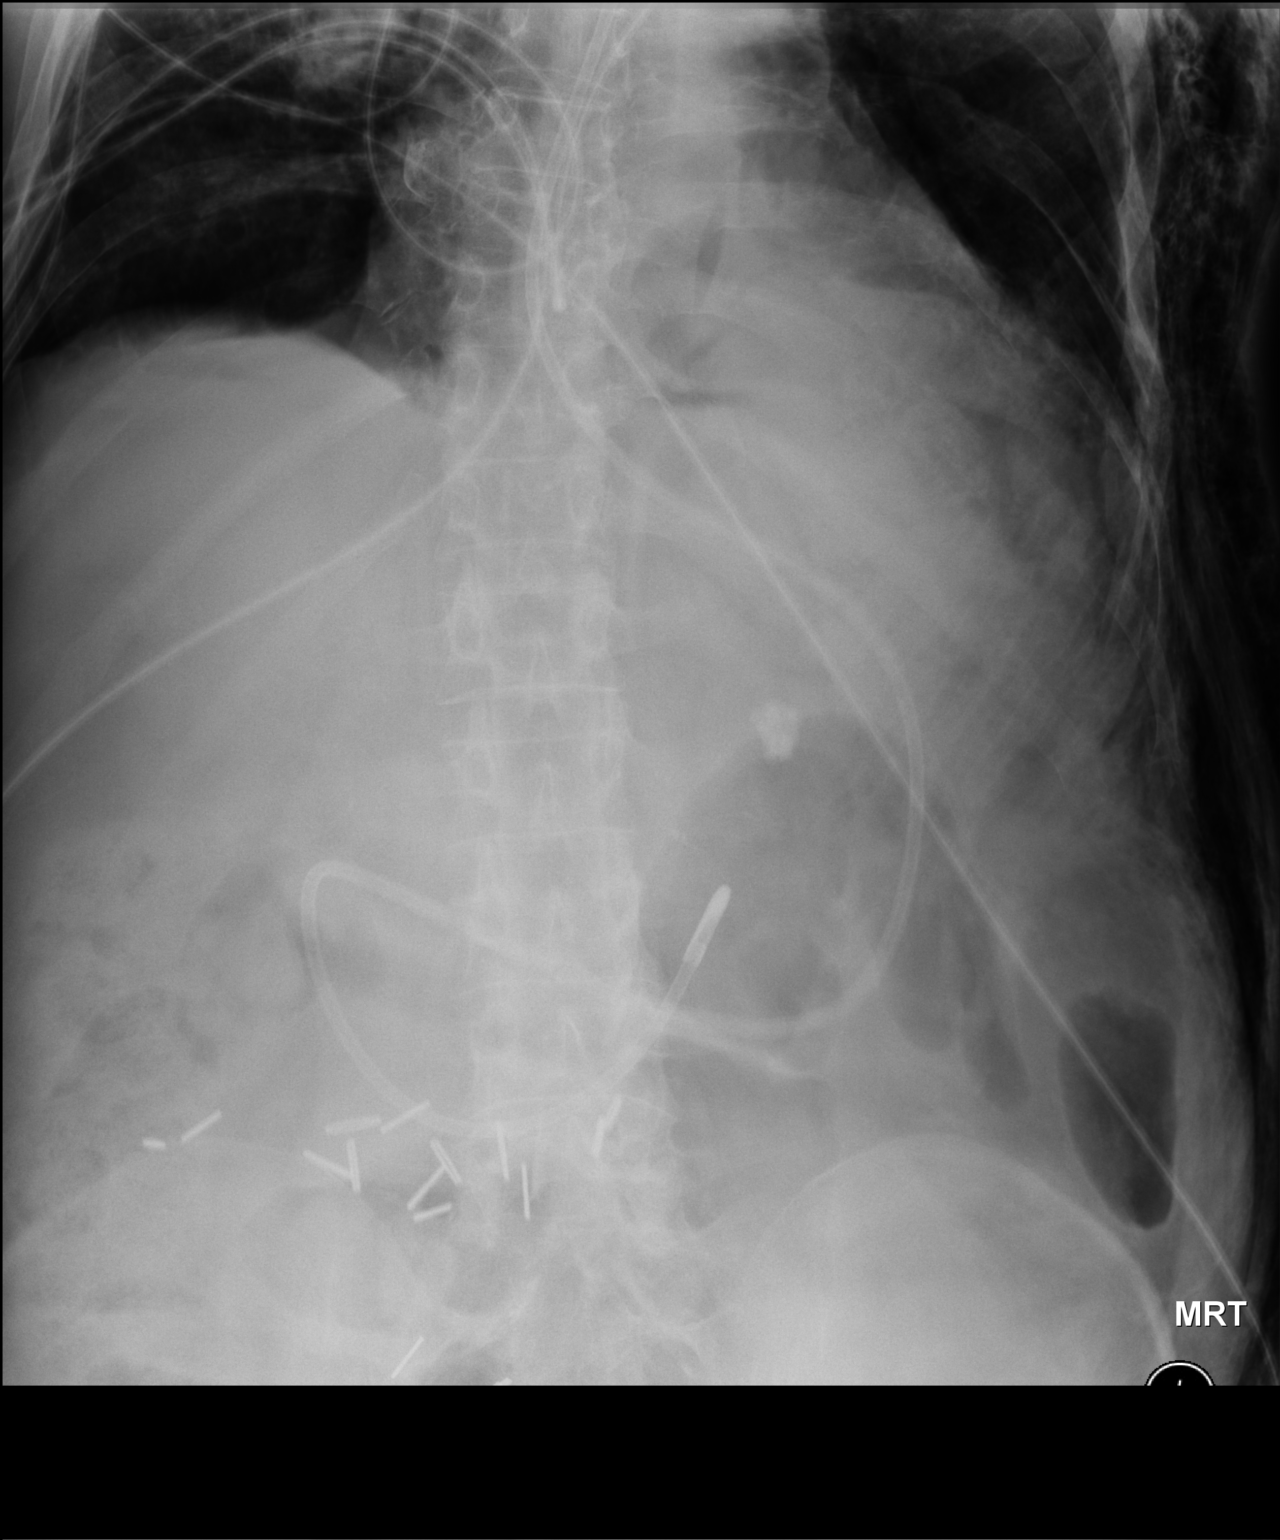

[1 of 1 positions shown; findings below may reference images not displayed]

FINDINGS: A feeding tube is identified traversing the stomach and duodenum
with the tip near the expected position of the ligament of Treitz. A
separate decompressive tube is not seen. There are multiple leads
overlapping the region of the lower chest and it is unclear whether
there may be a separate to in the distal esophagus.
IMPRESSION: Feeding tube extends to the level of the ligament of Treitz.
Separate gastric decompression tube is difficult to visualize, if
present. If there is a separate tube present, recommend tube
advancement and repeat of a film centered slightly higher to include
more of the lower chest. Removal/ movement of monitoring lead wires
would also be helpful.

## 2017-03-15 IMAGING — CR DG CHEST 1V PORT
1 series · 1 of 1 positions shown · non-contrast
Comparison: Chest radiograph dated 03/09/2015

CLINICAL DATA: 59-year-old male status post endotracheal tube
placement.

EXAM:
PORTABLE CHEST 1 VIEW

[AP]
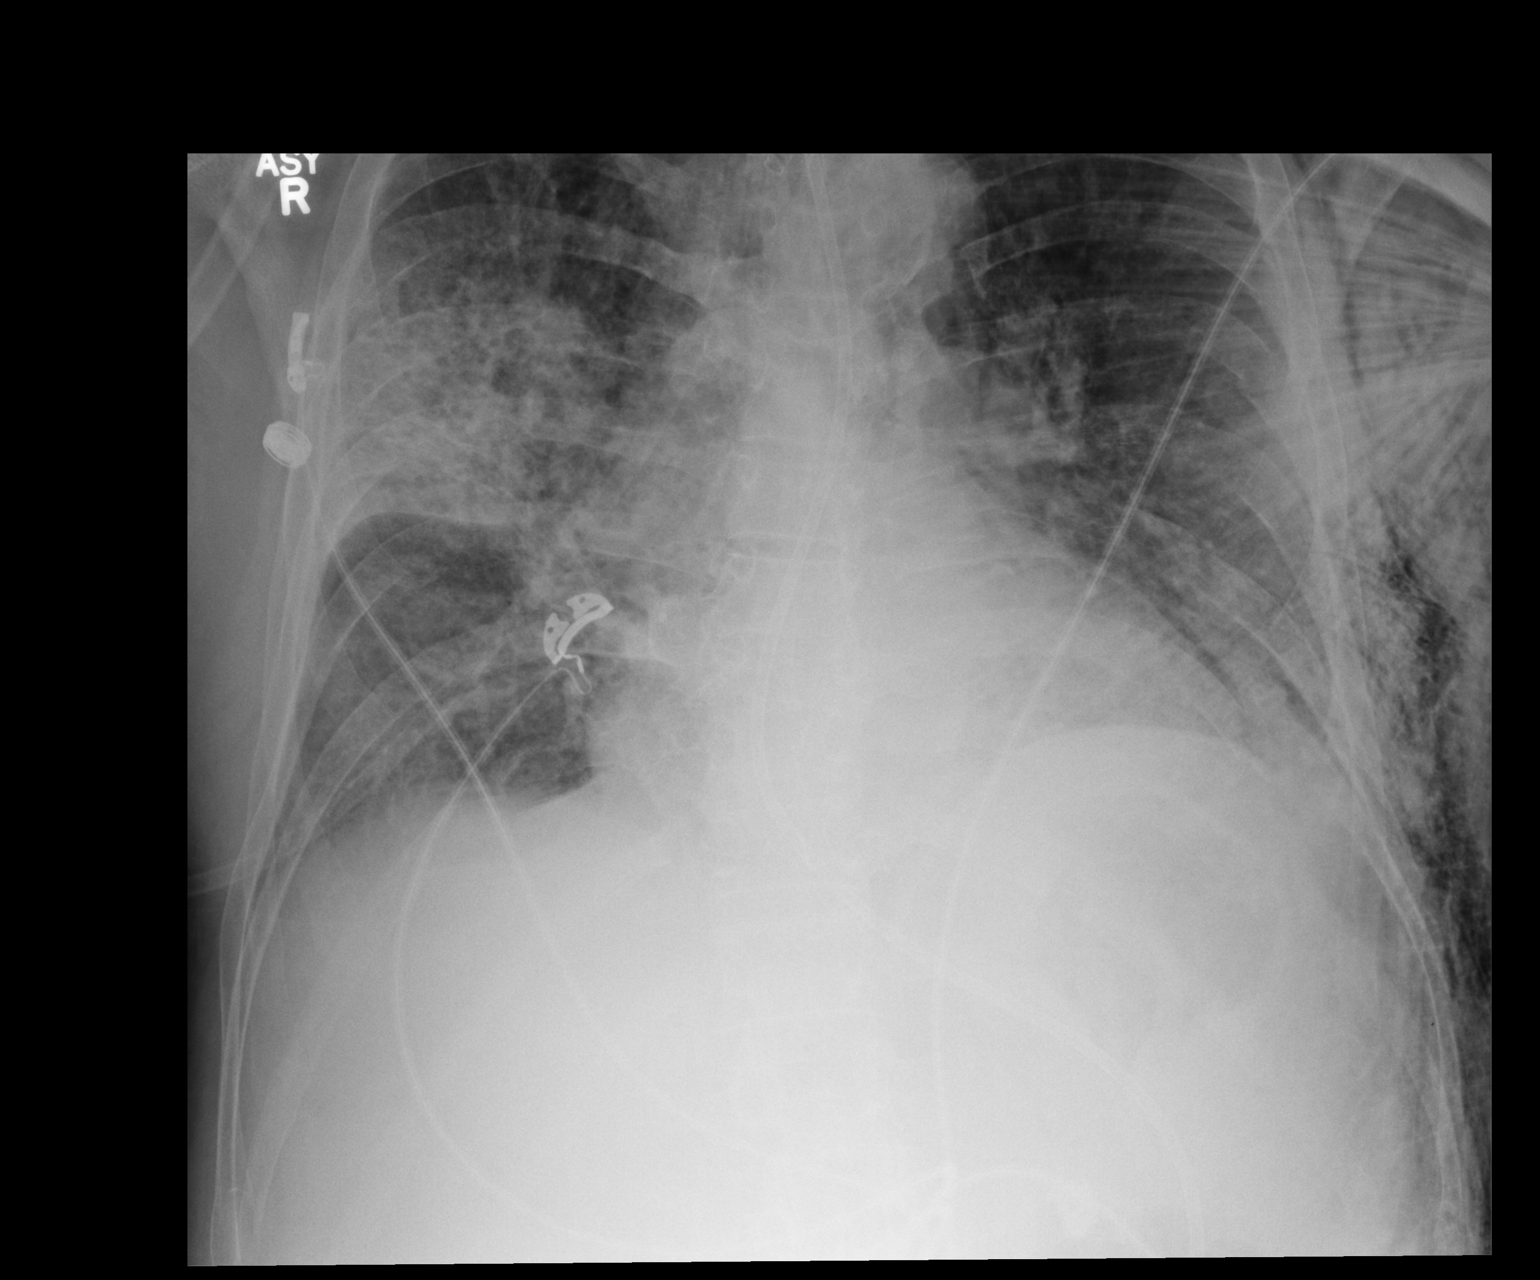

[1 of 1 positions shown; findings below may reference images not displayed]

FINDINGS: An endotracheal tube is seen with tip approximately 5 cm above the
carina. And enteric tube extends into the left hemi abdomen with tip
beyond the image cut off. A focal area of airspace opacity is noted
in the right mid lung field. There is emphysematous changes of the
lungs. No pleural effusion or pneumothorax. There is irregularity of
the left posterior sixth rib concerning for a fracture. Clinical
correlation is recommended. Left lateral chest wall soft tissue
emphysema.
IMPRESSION: Endotracheal tube above the carina.

Emphysema with focal right mid lung field airspace opacity.

Focal discontinuity of the left posterior sixth rib concerning for
fracture. Clinical correlation is recommended. Left chest wall soft
tissue emphysema. No pneumothorax.
